# Patient Record
Sex: Female | Born: 1945 | Race: Black or African American | Hispanic: No | Marital: Married | Smoking: Former smoker
Health system: Southern US, Community
[De-identification: ages and names within clinical notes are randomized; demographics above are authoritative.]

## PROBLEM LIST (undated history)

## (undated) DIAGNOSIS — E785 Hyperlipidemia, unspecified: Secondary | ICD-10-CM

## (undated) DIAGNOSIS — G893 Neoplasm related pain (acute) (chronic): Secondary | ICD-10-CM

## (undated) DIAGNOSIS — M5416 Radiculopathy, lumbar region: Secondary | ICD-10-CM

## (undated) DIAGNOSIS — I10 Essential (primary) hypertension: Secondary | ICD-10-CM

## (undated) HISTORY — PX: ABDOMINAL HYSTERECTOMY: SHX81

---

## 2003-12-08 ENCOUNTER — Other Ambulatory Visit: Admission: RE | Admit: 2003-12-08 | Discharge: 2003-12-08 | Payer: Self-pay | Admitting: Obstetrics & Gynecology

## 2003-12-08 ENCOUNTER — Encounter: Admission: RE | Admit: 2003-12-08 | Discharge: 2003-12-08 | Payer: Self-pay | Admitting: Gastroenterology

## 2003-12-09 ENCOUNTER — Emergency Department (HOSPITAL_COMMUNITY): Admission: EM | Admit: 2003-12-09 | Discharge: 2003-12-09 | Payer: Self-pay | Admitting: Emergency Medicine

## 2003-12-29 ENCOUNTER — Ambulatory Visit (HOSPITAL_COMMUNITY): Admission: RE | Admit: 2003-12-29 | Discharge: 2003-12-29 | Payer: Self-pay | Admitting: Urology

## 2004-01-20 ENCOUNTER — Observation Stay (HOSPITAL_COMMUNITY): Admission: RE | Admit: 2004-01-20 | Discharge: 2004-01-21 | Payer: Self-pay | Admitting: Obstetrics & Gynecology

## 2004-02-12 ENCOUNTER — Ambulatory Visit (HOSPITAL_COMMUNITY): Admission: RE | Admit: 2004-02-12 | Discharge: 2004-02-12 | Payer: Self-pay | Admitting: Gynecology

## 2004-02-18 ENCOUNTER — Ambulatory Visit: Admission: RE | Admit: 2004-02-18 | Discharge: 2004-02-18 | Payer: Self-pay | Admitting: Gynecology

## 2004-04-08 ENCOUNTER — Emergency Department (HOSPITAL_COMMUNITY): Admission: EM | Admit: 2004-04-08 | Discharge: 2004-04-08 | Payer: Self-pay | Admitting: Emergency Medicine

## 2004-06-24 ENCOUNTER — Ambulatory Visit: Admission: RE | Admit: 2004-06-24 | Discharge: 2004-06-24 | Payer: Self-pay | Admitting: Urology

## 2004-07-15 ENCOUNTER — Ambulatory Visit (HOSPITAL_COMMUNITY): Admission: RE | Admit: 2004-07-15 | Discharge: 2004-07-15 | Payer: Self-pay | Admitting: Urology

## 2004-07-19 ENCOUNTER — Ambulatory Visit (HOSPITAL_COMMUNITY): Admission: RE | Admit: 2004-07-19 | Discharge: 2004-07-19 | Payer: Self-pay | Admitting: Oncology

## 2005-08-01 ENCOUNTER — Ambulatory Visit (HOSPITAL_COMMUNITY): Admission: RE | Admit: 2005-08-01 | Discharge: 2005-08-01 | Payer: Self-pay | Admitting: Urology

## 2010-02-20 ENCOUNTER — Encounter: Payer: Self-pay | Admitting: Gastroenterology

## 2010-02-20 ENCOUNTER — Encounter: Payer: Self-pay | Admitting: Urology

## 2010-02-21 ENCOUNTER — Encounter: Payer: Self-pay | Admitting: Urology

## 2013-02-25 ENCOUNTER — Other Ambulatory Visit: Payer: Self-pay | Admitting: Family Medicine

## 2013-02-25 DIAGNOSIS — Z1231 Encounter for screening mammogram for malignant neoplasm of breast: Secondary | ICD-10-CM

## 2013-07-22 ENCOUNTER — Encounter (INDEPENDENT_AMBULATORY_CARE_PROVIDER_SITE_OTHER): Payer: Self-pay

## 2013-07-22 ENCOUNTER — Ambulatory Visit
Admission: RE | Admit: 2013-07-22 | Discharge: 2013-07-22 | Disposition: A | Discharge status: Home / Self Care | Payer: PRIVATE HEALTH INSURANCE | Source: Ambulatory Visit | Attending: Family Medicine | Admitting: Family Medicine

## 2013-07-22 DIAGNOSIS — Z1231 Encounter for screening mammogram for malignant neoplasm of breast: Secondary | ICD-10-CM

## 2013-07-23 ENCOUNTER — Other Ambulatory Visit: Payer: Self-pay | Admitting: Family Medicine

## 2013-07-23 DIAGNOSIS — R928 Other abnormal and inconclusive findings on diagnostic imaging of breast: Secondary | ICD-10-CM

## 2013-07-29 ENCOUNTER — Ambulatory Visit
Admission: RE | Admit: 2013-07-29 | Discharge: 2013-07-29 | Disposition: A | Discharge status: Home / Self Care | Payer: PRIVATE HEALTH INSURANCE | Source: Ambulatory Visit | Attending: Family Medicine | Admitting: Family Medicine

## 2013-07-29 ENCOUNTER — Other Ambulatory Visit: Payer: PRIVATE HEALTH INSURANCE

## 2013-07-29 DIAGNOSIS — R928 Other abnormal and inconclusive findings on diagnostic imaging of breast: Secondary | ICD-10-CM

## 2014-10-01 ENCOUNTER — Other Ambulatory Visit: Payer: Self-pay | Admitting: Family Medicine

## 2014-10-01 DIAGNOSIS — E2839 Other primary ovarian failure: Secondary | ICD-10-CM

## 2014-10-14 ENCOUNTER — Other Ambulatory Visit: Payer: PRIVATE HEALTH INSURANCE

## 2014-11-03 ENCOUNTER — Ambulatory Visit
Admission: RE | Admit: 2014-11-03 | Discharge: 2014-11-03 | Disposition: A | Discharge status: Home / Self Care | Payer: PRIVATE HEALTH INSURANCE | Source: Ambulatory Visit | Attending: Family Medicine | Admitting: Family Medicine

## 2014-11-03 DIAGNOSIS — E2839 Other primary ovarian failure: Secondary | ICD-10-CM

## 2016-04-04 ENCOUNTER — Other Ambulatory Visit: Payer: Self-pay | Admitting: Family Medicine

## 2016-04-04 DIAGNOSIS — Z1231 Encounter for screening mammogram for malignant neoplasm of breast: Secondary | ICD-10-CM

## 2016-06-14 ENCOUNTER — Ambulatory Visit
Admission: RE | Admit: 2016-06-14 | Discharge: 2016-06-14 | Disposition: A | Discharge status: Home / Self Care | Payer: PRIVATE HEALTH INSURANCE | Source: Ambulatory Visit | Attending: Family Medicine | Admitting: Family Medicine

## 2016-06-14 DIAGNOSIS — Z1231 Encounter for screening mammogram for malignant neoplasm of breast: Secondary | ICD-10-CM

## 2017-07-20 ENCOUNTER — Other Ambulatory Visit: Payer: Self-pay | Admitting: Family Medicine

## 2017-07-20 DIAGNOSIS — Z1231 Encounter for screening mammogram for malignant neoplasm of breast: Secondary | ICD-10-CM

## 2017-08-28 ENCOUNTER — Encounter: Payer: Self-pay | Admitting: Radiology

## 2017-08-28 ENCOUNTER — Ambulatory Visit
Admission: RE | Admit: 2017-08-28 | Discharge: 2017-08-28 | Disposition: A | Discharge status: Home / Self Care | Payer: PRIVATE HEALTH INSURANCE | Source: Ambulatory Visit | Attending: Family Medicine | Admitting: Family Medicine

## 2017-08-28 DIAGNOSIS — Z1231 Encounter for screening mammogram for malignant neoplasm of breast: Secondary | ICD-10-CM

## 2019-03-23 ENCOUNTER — Ambulatory Visit: Payer: PRIVATE HEALTH INSURANCE | Attending: Internal Medicine

## 2019-03-23 DIAGNOSIS — Z23 Encounter for immunization: Secondary | ICD-10-CM | POA: Insufficient documentation

## 2019-03-23 NOTE — Progress Notes (Signed)
   Covid-19 Vaccination Clinic  Name:  OTILIA KAREEM    MRN: [id number] DOB: 08/31/1949  03/23/2019  Ms. Heyliger was observed post Covid-19 immunization for 15 minutes without incidence. She was provided with Vaccine Information Sheet and instruction to access the V-Safe system.   Ms. Perdomo was instructed to call 911 with any severe reactions post vaccine: Marland Kitchen Difficulty breathing  . Swelling of your face and throat  . A fast heartbeat  . A bad rash all over your body  . Dizziness and weakness    Immunizations Administered    Name Date Dose VIS Date Route   Pfizer COVID-19 Vaccine 03/23/2019  3:13 PM 0.3 mL 01/11/2019 Intramuscular   Manufacturer: [place]   Lot: [id number]   [place]: [address]-6

## 2019-04-16 ENCOUNTER — Ambulatory Visit: Payer: PRIVATE HEALTH INSURANCE | Attending: Internal Medicine

## 2019-04-16 DIAGNOSIS — Z23 Encounter for immunization: Secondary | ICD-10-CM

## 2019-04-16 NOTE — Progress Notes (Signed)
   Covid-19 Vaccination Clinic  Name:  Alisha Goodman    MRN: [id number] DOB: 08/31/1949  04/16/2019  [name] was observed post Covid-19 immunization for 15 minutes without incident. She was provided with Vaccine Information Sheet and instruction to access the V-Safe system.   [name] was instructed to call 911 with any severe reactions post vaccine: Marland Kitchen Difficulty breathing  . Swelling of face and throat  . A fast heartbeat  . A bad rash all over body  . Dizziness and weakness   Immunizations Administered    Name Date Dose VIS Date Route   Pfizer COVID-19 Vaccine 04/16/2019  1:21 PM 0.3 mL 01/11/2019 Intramuscular   Manufacturer: [place]   Lot: [id number]   [place]: [id number]

## 2019-06-27 ENCOUNTER — Other Ambulatory Visit: Payer: Self-pay

## 2019-06-27 ENCOUNTER — Encounter (HOSPITAL_COMMUNITY): Payer: Self-pay | Admitting: Emergency Medicine

## 2019-06-27 DIAGNOSIS — Z90710 Acquired absence of both cervix and uterus: Secondary | ICD-10-CM

## 2019-06-27 DIAGNOSIS — I472 Ventricular tachycardia: Secondary | ICD-10-CM | POA: Diagnosis not present

## 2019-06-27 DIAGNOSIS — Z886 Allergy status to analgesic agent status: Secondary | ICD-10-CM

## 2019-06-27 DIAGNOSIS — G893 Neoplasm related pain (acute) (chronic): Secondary | ICD-10-CM | POA: Diagnosis present

## 2019-06-27 DIAGNOSIS — Z20822 Contact with and (suspected) exposure to covid-19: Secondary | ICD-10-CM | POA: Diagnosis present

## 2019-06-27 DIAGNOSIS — D638 Anemia in other chronic diseases classified elsewhere: Secondary | ICD-10-CM | POA: Diagnosis present

## 2019-06-27 DIAGNOSIS — R64 Cachexia: Secondary | ICD-10-CM | POA: Diagnosis present

## 2019-06-27 DIAGNOSIS — J439 Emphysema, unspecified: Secondary | ICD-10-CM | POA: Diagnosis present

## 2019-06-27 DIAGNOSIS — Z79899 Other long term (current) drug therapy: Secondary | ICD-10-CM

## 2019-06-27 DIAGNOSIS — I5021 Acute systolic (congestive) heart failure: Secondary | ICD-10-CM | POA: Diagnosis present

## 2019-06-27 DIAGNOSIS — Z882 Allergy status to sulfonamides status: Secondary | ICD-10-CM

## 2019-06-27 DIAGNOSIS — Z6821 Body mass index (BMI) 21.0-21.9, adult: Secondary | ICD-10-CM

## 2019-06-27 DIAGNOSIS — C259 Malignant neoplasm of pancreas, unspecified: Secondary | ICD-10-CM | POA: Diagnosis present

## 2019-06-27 DIAGNOSIS — T40605A Adverse effect of unspecified narcotics, initial encounter: Secondary | ICD-10-CM | POA: Diagnosis present

## 2019-06-27 DIAGNOSIS — I700 Atherosclerosis of aorta: Secondary | ICD-10-CM | POA: Diagnosis present

## 2019-06-27 DIAGNOSIS — I110 Hypertensive heart disease with heart failure: Secondary | ICD-10-CM | POA: Diagnosis present

## 2019-06-27 DIAGNOSIS — C7989 Secondary malignant neoplasm of other specified sites: Secondary | ICD-10-CM | POA: Diagnosis present

## 2019-06-27 DIAGNOSIS — C3491 Malignant neoplasm of unspecified part of right bronchus or lung: Principal | ICD-10-CM | POA: Diagnosis present

## 2019-06-27 DIAGNOSIS — C771 Secondary and unspecified malignant neoplasm of intrathoracic lymph nodes: Secondary | ICD-10-CM | POA: Diagnosis present

## 2019-06-27 DIAGNOSIS — B9620 Unspecified Escherichia coli [E. coli] as the cause of diseases classified elsewhere: Secondary | ICD-10-CM | POA: Diagnosis present

## 2019-06-27 DIAGNOSIS — R590 Localized enlarged lymph nodes: Secondary | ICD-10-CM | POA: Diagnosis present

## 2019-06-27 DIAGNOSIS — J9809 Other diseases of bronchus, not elsewhere classified: Secondary | ICD-10-CM | POA: Diagnosis present

## 2019-06-27 DIAGNOSIS — C7951 Secondary malignant neoplasm of bone: Secondary | ICD-10-CM | POA: Diagnosis present

## 2019-06-27 DIAGNOSIS — I4892 Unspecified atrial flutter: Secondary | ICD-10-CM | POA: Diagnosis not present

## 2019-06-27 DIAGNOSIS — E785 Hyperlipidemia, unspecified: Secondary | ICD-10-CM | POA: Diagnosis present

## 2019-06-27 DIAGNOSIS — M5416 Radiculopathy, lumbar region: Secondary | ICD-10-CM | POA: Diagnosis present

## 2019-06-27 DIAGNOSIS — K5903 Drug induced constipation: Secondary | ICD-10-CM | POA: Diagnosis present

## 2019-06-27 DIAGNOSIS — I447 Left bundle-branch block, unspecified: Secondary | ICD-10-CM | POA: Diagnosis present

## 2019-06-27 DIAGNOSIS — C787 Secondary malignant neoplasm of liver and intrahepatic bile duct: Secondary | ICD-10-CM | POA: Diagnosis present

## 2019-06-27 DIAGNOSIS — E871 Hypo-osmolality and hyponatremia: Secondary | ICD-10-CM | POA: Diagnosis present

## 2019-06-27 DIAGNOSIS — N390 Urinary tract infection, site not specified: Secondary | ICD-10-CM | POA: Diagnosis present

## 2019-06-27 DIAGNOSIS — M62838 Other muscle spasm: Secondary | ICD-10-CM | POA: Diagnosis present

## 2019-06-27 NOTE — ED Triage Notes (Signed)
Per pt, states left lower back pain that radiates down the left leg-stopped PT 2 weeks ago because it wasn't doing any good, also complaining of nausea

## 2019-06-28 ENCOUNTER — Inpatient Hospital Stay (HOSPITAL_COMMUNITY)
Admission: EM | Admit: 2019-06-28 | Discharge: 2019-07-02 | DRG: 987 | Disposition: A | Discharge status: Home Health | Payer: Medicare (Managed Care) | Attending: Internal Medicine | Admitting: Internal Medicine

## 2019-06-28 ENCOUNTER — Inpatient Hospital Stay (HOSPITAL_COMMUNITY): Payer: Medicare (Managed Care)

## 2019-06-28 ENCOUNTER — Emergency Department (HOSPITAL_COMMUNITY): Payer: Medicare (Managed Care)

## 2019-06-28 ENCOUNTER — Other Ambulatory Visit: Payer: Self-pay

## 2019-06-28 ENCOUNTER — Encounter (HOSPITAL_COMMUNITY): Payer: Self-pay | Admitting: Internal Medicine

## 2019-06-28 DIAGNOSIS — I110 Hypertensive heart disease with heart failure: Secondary | ICD-10-CM | POA: Diagnosis present

## 2019-06-28 DIAGNOSIS — K5900 Constipation, unspecified: Secondary | ICD-10-CM | POA: Diagnosis not present

## 2019-06-28 DIAGNOSIS — M5416 Radiculopathy, lumbar region: Secondary | ICD-10-CM | POA: Diagnosis present

## 2019-06-28 DIAGNOSIS — I700 Atherosclerosis of aorta: Secondary | ICD-10-CM | POA: Diagnosis present

## 2019-06-28 DIAGNOSIS — C800 Disseminated malignant neoplasm, unspecified: Secondary | ICD-10-CM | POA: Diagnosis not present

## 2019-06-28 DIAGNOSIS — R64 Cachexia: Secondary | ICD-10-CM | POA: Diagnosis present

## 2019-06-28 DIAGNOSIS — C770 Secondary and unspecified malignant neoplasm of lymph nodes of head, face and neck: Secondary | ICD-10-CM | POA: Diagnosis not present

## 2019-06-28 DIAGNOSIS — D638 Anemia in other chronic diseases classified elsewhere: Secondary | ICD-10-CM | POA: Diagnosis present

## 2019-06-28 DIAGNOSIS — C771 Secondary and unspecified malignant neoplasm of intrathoracic lymph nodes: Secondary | ICD-10-CM | POA: Diagnosis present

## 2019-06-28 DIAGNOSIS — C799 Secondary malignant neoplasm of unspecified site: Secondary | ICD-10-CM

## 2019-06-28 DIAGNOSIS — Z20822 Contact with and (suspected) exposure to covid-19: Secondary | ICD-10-CM | POA: Diagnosis present

## 2019-06-28 DIAGNOSIS — R079 Chest pain, unspecified: Secondary | ICD-10-CM

## 2019-06-28 DIAGNOSIS — R7989 Other specified abnormal findings of blood chemistry: Secondary | ICD-10-CM | POA: Diagnosis not present

## 2019-06-28 DIAGNOSIS — N390 Urinary tract infection, site not specified: Secondary | ICD-10-CM

## 2019-06-28 DIAGNOSIS — E871 Hypo-osmolality and hyponatremia: Secondary | ICD-10-CM

## 2019-06-28 DIAGNOSIS — I5022 Chronic systolic (congestive) heart failure: Secondary | ICD-10-CM

## 2019-06-28 DIAGNOSIS — C7951 Secondary malignant neoplasm of bone: Secondary | ICD-10-CM | POA: Diagnosis present

## 2019-06-28 DIAGNOSIS — J439 Emphysema, unspecified: Secondary | ICD-10-CM

## 2019-06-28 DIAGNOSIS — M62838 Other muscle spasm: Secondary | ICD-10-CM | POA: Diagnosis present

## 2019-06-28 DIAGNOSIS — R590 Localized enlarged lymph nodes: Secondary | ICD-10-CM | POA: Diagnosis present

## 2019-06-28 DIAGNOSIS — C7989 Secondary malignant neoplasm of other specified sites: Secondary | ICD-10-CM | POA: Diagnosis present

## 2019-06-28 DIAGNOSIS — C782 Secondary malignant neoplasm of pleura: Secondary | ICD-10-CM

## 2019-06-28 DIAGNOSIS — R000 Tachycardia, unspecified: Secondary | ICD-10-CM | POA: Diagnosis not present

## 2019-06-28 DIAGNOSIS — C259 Malignant neoplasm of pancreas, unspecified: Secondary | ICD-10-CM | POA: Diagnosis present

## 2019-06-28 DIAGNOSIS — I4892 Unspecified atrial flutter: Secondary | ICD-10-CM | POA: Diagnosis not present

## 2019-06-28 DIAGNOSIS — I5021 Acute systolic (congestive) heart failure: Secondary | ICD-10-CM | POA: Diagnosis not present

## 2019-06-28 DIAGNOSIS — K5903 Drug induced constipation: Secondary | ICD-10-CM | POA: Diagnosis present

## 2019-06-28 DIAGNOSIS — B9620 Unspecified Escherichia coli [E. coli] as the cause of diseases classified elsewhere: Secondary | ICD-10-CM | POA: Diagnosis present

## 2019-06-28 DIAGNOSIS — G893 Neoplasm related pain (acute) (chronic): Secondary | ICD-10-CM | POA: Diagnosis present

## 2019-06-28 DIAGNOSIS — I472 Ventricular tachycardia: Secondary | ICD-10-CM | POA: Diagnosis not present

## 2019-06-28 DIAGNOSIS — C787 Secondary malignant neoplasm of liver and intrahepatic bile duct: Secondary | ICD-10-CM | POA: Diagnosis present

## 2019-06-28 DIAGNOSIS — I10 Essential (primary) hypertension: Secondary | ICD-10-CM | POA: Diagnosis not present

## 2019-06-28 DIAGNOSIS — C3491 Malignant neoplasm of unspecified part of right bronchus or lung: Secondary | ICD-10-CM | POA: Diagnosis present

## 2019-06-28 HISTORY — DX: Hyperlipidemia, unspecified: E78.5

## 2019-06-28 HISTORY — DX: Essential (primary) hypertension: I10

## 2019-06-28 HISTORY — DX: Secondary malignant neoplasm of unspecified site: C79.9

## 2019-06-28 HISTORY — DX: Neoplasm related pain (acute) (chronic): G89.3

## 2019-06-28 HISTORY — DX: Radiculopathy, lumbar region: M54.16

## 2019-06-28 LAB — URINALYSIS, ROUTINE W REFLEX MICROSCOPIC
Bilirubin Urine: NEGATIVE
Glucose, UA: NEGATIVE mg/dL
Ketones, ur: NEGATIVE mg/dL
Nitrite: NEGATIVE
Protein, ur: 100 mg/dL — AB
Specific Gravity, Urine: 1.024 (ref 1.005–1.030)
WBC, UA: >50 WBC/hpf — ABNORMAL HIGH (ref 0–5)
pH: 7 (ref 5.0–8.0)

## 2019-06-28 LAB — CBC WITH DIFFERENTIAL/PLATELET
Abs Immature Granulocytes: 0.12 10*3/uL — ABNORMAL HIGH (ref 0.00–0.07)
Basophils Absolute: 0 10*3/uL (ref 0.0–0.1)
Basophils Relative: 0 %
Eosinophils Absolute: 0 10*3/uL (ref 0.0–0.5)
Eosinophils Relative: 0 %
HCT: 36.7 % (ref 36.0–46.0)
Hemoglobin: 11.9 g/dL — ABNORMAL LOW (ref 12.0–15.0)
Immature Granulocytes: 1 %
Lymphocytes Relative: 13 %
Lymphs Abs: 1.7 10*3/uL (ref 0.7–4.0)
MCH: 27 pg (ref 26.0–34.0)
MCHC: 32.4 g/dL (ref 30.0–36.0)
MCV: 83.2 fL (ref 80.0–100.0)
Monocytes Absolute: 1 10*3/uL (ref 0.1–1.0)
Monocytes Relative: 8 %
Neutro Abs: 9.5 10*3/uL — ABNORMAL HIGH (ref 1.7–7.7)
Neutrophils Relative %: 78 %
Platelets: 617 10*3/uL — ABNORMAL HIGH (ref 150–400)
RBC: 4.41 MIL/uL (ref 3.87–5.11)
RDW: 14.6 % (ref 11.5–15.5)
WBC: 12.3 10*3/uL — ABNORMAL HIGH (ref 4.0–10.5)
nRBC: 0 % (ref 0.0–0.2)

## 2019-06-28 LAB — CBC
HCT: 35 % — ABNORMAL LOW (ref 36.0–46.0)
Hemoglobin: 11.6 g/dL — ABNORMAL LOW (ref 12.0–15.0)
MCH: 27.6 pg (ref 26.0–34.0)
MCHC: 33.1 g/dL (ref 30.0–36.0)
MCV: 83.1 fL (ref 80.0–100.0)
Platelets: 540 10*3/uL — ABNORMAL HIGH (ref 150–400)
RBC: 4.21 MIL/uL (ref 3.87–5.11)
RDW: 14.6 % (ref 11.5–15.5)
WBC: 10.8 10*3/uL — ABNORMAL HIGH (ref 4.0–10.5)
nRBC: 0 % (ref 0.0–0.2)

## 2019-06-28 LAB — ECHOCARDIOGRAM COMPLETE
Height: 65 in
Weight: 2064 oz

## 2019-06-28 LAB — COMPREHENSIVE METABOLIC PANEL
ALT: 375 U/L — ABNORMAL HIGH (ref 0–44)
AST: 264 U/L — ABNORMAL HIGH (ref 15–41)
Albumin: 4 g/dL (ref 3.5–5.0)
Alkaline Phosphatase: 351 U/L — ABNORMAL HIGH (ref 38–126)
Anion gap: 15 (ref 5–15)
BUN: 20 mg/dL (ref 8–23)
CO2: 21 mmol/L — ABNORMAL LOW (ref 22–32)
Calcium: 9.5 mg/dL (ref 8.9–10.3)
Chloride: 96 mmol/L — ABNORMAL LOW (ref 98–111)
Creatinine, Ser: 1.11 mg/dL — ABNORMAL HIGH (ref 0.44–1.00)
GFR calc Af Amer: 58 mL/min — ABNORMAL LOW (ref >60)
GFR calc non Af Amer: 50 mL/min — ABNORMAL LOW (ref >60)
Glucose, Bld: 105 mg/dL — ABNORMAL HIGH (ref 70–99)
Potassium: 3.7 mmol/L (ref 3.5–5.1)
Sodium: 132 mmol/L — ABNORMAL LOW (ref 135–145)
Total Bilirubin: 1 mg/dL (ref 0.3–1.2)
Total Protein: 8.9 g/dL — ABNORMAL HIGH (ref 6.5–8.1)

## 2019-06-28 LAB — BRAIN NATRIURETIC PEPTIDE: B Natriuretic Peptide: 30.2 pg/mL (ref 0.0–100.0)

## 2019-06-28 LAB — CREATININE, SERUM
Creatinine, Ser: 0.92 mg/dL (ref 0.44–1.00)
GFR calc Af Amer: >60 mL/min (ref >60)
GFR calc non Af Amer: >60 mL/min (ref >60)

## 2019-06-28 LAB — HIV ANTIBODY (ROUTINE TESTING W REFLEX): HIV Screen 4th Generation wRfx: NONREACTIVE

## 2019-06-28 LAB — SARS CORONAVIRUS 2 BY RT PCR (HOSPITAL ORDER, PERFORMED IN ~~LOC~~ HOSPITAL LAB): SARS Coronavirus 2: NEGATIVE

## 2019-06-28 LAB — LIPASE, BLOOD: Lipase: 325 U/L — ABNORMAL HIGH (ref 11–51)

## 2019-06-28 MED ORDER — GABAPENTIN 100 MG PO CAPS
100.0000 mg | ORAL_CAPSULE | Freq: Every day | ORAL | Status: DC
  Administered 2019-06-29 – 2019-07-01 (×3): 100 mg via ORAL
  Filled 2019-06-28 (×4): qty 1

## 2019-06-28 MED ORDER — IOHEXOL 300 MG/ML  SOLN
100.0000 mL | Freq: Once | INTRAMUSCULAR | Status: AC | PRN
  Administered 2019-06-28: 100 mL via INTRAVENOUS

## 2019-06-28 MED ORDER — ACETAMINOPHEN 650 MG RE SUPP: 650.0000 mg | Freq: Four times a day (QID) | RECTAL | Status: DC | PRN

## 2019-06-28 MED ORDER — NICOTINE 21 MG/24HR TD PT24
21.0000 mg | MEDICATED_PATCH | Freq: Every day | TRANSDERMAL | Status: DC
  Administered 2019-06-28 – 2019-07-02 (×5): 21 mg via TRANSDERMAL
  Filled 2019-06-28 (×5): qty 1

## 2019-06-28 MED ORDER — ONDANSETRON HCL 4 MG/2ML IJ SOLN: 4.0000 mg | Freq: Four times a day (QID) | INTRAMUSCULAR | Status: DC | PRN

## 2019-06-28 MED ORDER — HEPARIN SODIUM (PORCINE) 5000 UNIT/ML IJ SOLN
5000.0000 [IU] | Freq: Three times a day (TID) | INTRAMUSCULAR | Status: DC
  Administered 2019-06-28 – 2019-07-02 (×13): 5000 [IU] via SUBCUTANEOUS
  Filled 2019-06-28 (×13): qty 1

## 2019-06-28 MED ORDER — LIDOCAINE HCL 1 % IJ SOLN
INTRAMUSCULAR | Status: AC
  Filled 2019-06-28: qty 20

## 2019-06-28 MED ORDER — SODIUM CHLORIDE 0.9% FLUSH
3.0000 mL | Freq: Two times a day (BID) | INTRAVENOUS | Status: DC
  Administered 2019-06-28 – 2019-07-02 (×8): 3 mL via INTRAVENOUS

## 2019-06-28 MED ORDER — OXYCODONE HCL 5 MG PO TABS: 5.0000 mg | ORAL_TABLET | ORAL | Status: DC | PRN

## 2019-06-28 MED ORDER — PERFLUTREN LIPID MICROSPHERE
1.0000 mL | INTRAVENOUS | Status: AC | PRN
  Administered 2019-06-28: 2 mL via INTRAVENOUS
  Filled 2019-06-28: qty 10

## 2019-06-28 MED ORDER — CLONIDINE HCL 0.2 MG PO TABS
0.3000 mg | ORAL_TABLET | Freq: Two times a day (BID) | ORAL | Status: DC
  Administered 2019-06-28 – 2019-06-30 (×6): 0.3 mg via ORAL
  Filled 2019-06-28: qty 1
  Filled 2019-06-28: qty 3
  Filled 2019-06-28 (×4): qty 1

## 2019-06-28 MED ORDER — ONDANSETRON HCL 4 MG PO TABS: 4.0000 mg | ORAL_TABLET | Freq: Four times a day (QID) | ORAL | Status: DC | PRN

## 2019-06-28 MED ORDER — FENTANYL CITRATE (PF) 100 MCG/2ML IJ SOLN
25.0000 ug | Freq: Once | INTRAMUSCULAR | Status: AC
  Administered 2019-06-28: 25 ug via INTRAVENOUS
  Filled 2019-06-28: qty 2

## 2019-06-28 MED ORDER — OXYCODONE HCL 5 MG PO TABS
5.0000 mg | ORAL_TABLET | ORAL | Status: DC | PRN
  Administered 2019-06-28 – 2019-06-29 (×4): 5 mg via ORAL
  Filled 2019-06-28 (×4): qty 1

## 2019-06-28 MED ORDER — ACETAMINOPHEN 325 MG PO TABS
650.0000 mg | ORAL_TABLET | Freq: Four times a day (QID) | ORAL | Status: DC | PRN
  Administered 2019-06-29 (×2): 650 mg via ORAL
  Filled 2019-06-28 (×2): qty 2

## 2019-06-28 MED ORDER — AMLODIPINE BESYLATE 10 MG PO TABS
10.0000 mg | ORAL_TABLET | Freq: Every day | ORAL | Status: DC
  Administered 2019-06-28 – 2019-06-29 (×2): 10 mg via ORAL
  Filled 2019-06-28: qty 2
  Filled 2019-06-28 (×2): qty 1

## 2019-06-28 MED ORDER — SODIUM CHLORIDE 0.9 % IV SOLN: INTRAVENOUS | Status: AC

## 2019-06-28 MED ORDER — HYDROMORPHONE HCL 1 MG/ML IJ SOLN: 0.5000 mg | INTRAMUSCULAR | Status: DC | PRN

## 2019-06-28 MED ORDER — ALBUTEROL SULFATE (2.5 MG/3ML) 0.083% IN NEBU: 2.5000 mg | INHALATION_SOLUTION | RESPIRATORY_TRACT | Status: DC | PRN

## 2019-06-28 MED ORDER — HYDROMORPHONE HCL 1 MG/ML IJ SOLN
0.5000 mg | INTRAMUSCULAR | Status: DC | PRN
  Administered 2019-06-28 (×3): 1 mg via INTRAVENOUS
  Filled 2019-06-28 (×3): qty 1

## 2019-06-28 MED ORDER — SODIUM CHLORIDE (PF) 0.9 % IJ SOLN
INTRAMUSCULAR | Status: AC
  Filled 2019-06-28: qty 50

## 2019-06-28 MED ORDER — SODIUM CHLORIDE 0.9 % IV SOLN
1.0000 g | INTRAVENOUS | Status: DC
  Administered 2019-06-28: 1 g via INTRAVENOUS
  Filled 2019-06-28: qty 10

## 2019-06-28 NOTE — Progress Notes (Signed)
Patient ID: Alisha Goodman, female   DOB: 08/31/1949, 74 y.o.   MRN: [id number] Bx request received on pt. Case/imaging studies reviewed by Dr. Earleen Newport and d/w Dr. Burr Medico. Decision made to proceed with US guided left supraclavicular LN bx this afternoon. Risks and benefits of procedure was discussed with the patient including, but not limited to bleeding, infection, damage to adjacent structures or low yield requiring additional tests.  All of the questions were answered and there is agreement to proceed.  Consent signed and in chart.

## 2019-06-28 NOTE — Progress Notes (Signed)
PROGRESS NOTE  Alisha Goodman YTK:[id number] DOB: 08/31/1949 DOA: 06/28/2019 PCP: Jolinda Croak, MD  Brief History   74 year old woman PMH lumbar radiculopathy, smoker, presented to the emergency department with progressive left leg pain for several weeks.  Weight loss reported.  A & P  Left leg pain impairing ability to walk --Presumably secondary to metastatic disease.  Motor function intact, sensation intact. --Trial oxycodone.  PT evaluation.  Multifocal lesions throughout the chest and abdomen suspicious for metastatic disease, primary unclear but possibly right lung primary versus pancreatic mass.  Right renal mass.  Subcutaneous metastasis right chest wall.  Bulky mediastinal and right hilar adenopathy with narrowing of the right upper lobe bronchus, right bronchus intermedius, upper and lower lobe bronchi. --Main symptom is pain.  No respiratory symptoms.  --Patient interested in pursuing biopsy, if not via bronchoscopy, will discuss with interventional radiology.  Pulmonology consulted. --will need close outpatient oncology followup, will arrange --We will confer with GI, may need outpatient follow-up to monitor for biliary obstruction.  Elevated LFTs --Asymptomatic.  Secondary to metastatic disease  Aortic atherosclerosis  Emphysema  Disposition Plan:  Discussion: Diagnosis: Metastatic cancer.  Patient requested I not speak with her son in regard to her condition.  She understands current diagnoses and wishes to proceed with biopsy.  We will proceed with pain control, therapy evaluation.  Will confer with pulmonology, oncology, GI for close coordination of care in the near future.  Status is: Inpatient  Remains inpatient appropriate because:Ongoing diagnostic testing needed not appropriate for outpatient work up   Dispo: The patient is from: Home              Anticipated d/c is to: Home              Anticipated d/c date is: 2 days              Patient currently is  not medically stable to d/c.  DVT prophylaxis: SCDs Code Status: Full Family Communication: Patient specifically requested I not talk to her son    [name], MD  [facility] Direct contact: see [email or web address] (further directions at bottom of note if needed) 7PM-7AM contact night coverage as at bottom of note 06/28/2019, 12:02 PM  LOS: 0 days   Significant Hospital Events   .    Consults:  .    Procedures:  .   Significant Diagnostic Tests:  Marland Kitchen    Micro Data:  .    Antimicrobials:     Interval History/Subjective  Complaints.  Only complaint is left leg, thigh pain. "I cannot believe it".  Objective   Vitals:  Vitals:   06/28/19 1100 06/28/19 1114  BP: 120/72 120/72  Pulse: (!) 115 (!) 117  Resp: (!) 25 (!) 21  Temp:    SpO2: 100% 100%    Exam:  Constitutional.  Appears calm, comfortable. ENT.  Voice is hoarse. Respiratory.  Clear to auscultation bilaterally.  No wheezes, rales or rhonchi.  Normal respiratory effort. Cardiovascular.  Regular rate and rhythm.  No murmur, rub or gallop.  No lower extremity edema. Musculoskeletal.  Left lower extremity strength appears grossly intact.  Thigh appears unremarkable.  No masses appreciated. Skin.  No nodules noted. Neurologic.  Sensation left lower extremity grossly intact. Psychiatric.  Grossly normal mood and affect.  Speech fluent and appropriate.  I have personally reviewed the following:   Today's Data  . Modest hyponatremia with sodium 132, lipase 325, AST 264, ALT 375, total bilirubin 1.0 .  BMP within normal limits . CBC unremarkable  Scheduled Meds: . amLODipine  10 mg Oral Daily  . cloNIDine  0.3 mg Oral BID  . gabapentin  100 mg Oral Daily  . heparin  5,000 Units Subcutaneous Q8H  . nicotine  21 mg Transdermal Daily  . sodium chloride (PF)      . sodium chloride flush  3 mL Intravenous Q12H   Continuous Infusions: . sodium chloride 75 mL/hr at 06/28/19 0604  . cefTRIAXone (ROCEPHIN)   IV 1 g (06/28/19 0370)    Active Problems:   Metastatic cancer ([place])   LOS: 0 days   How to contact the [facility] Attending or Consulting provider [place] or covering provider during after hours [place], for this patient?  1. Check the care team in [facility] and look for a) attending/consulting [facility] provider listed and b) the [facility] team listed 2. Log into [email or web address] and use [place]'s universal password to access. If you do not have the password, please contact the hospital operator. 3. Locate the [facility] provider you are looking for under [facility] and page to a number that you can be directly reached. 4. If you still have difficulty reaching the provider, please page the [facility] (Director on Call) for the Hospitalists listed on amion for assistance.

## 2019-06-28 NOTE — Progress Notes (Signed)
  Echocardiogram 2D Echocardiogram has been performed.  Michiel Cowboy 06/28/2019, 2:29 PM

## 2019-06-28 NOTE — Progress Notes (Signed)
CTSP for red MEWS Patient sitting, eating dinner, appears well and unchanged from this morning. Temp now 99.1, respirations charted at 24 but appear less on my exam.  Blood pressure 150/79 and pulse 120. Tachycardic on exam but lungs are clear. Speech fluent and clear, no acute distress noted.  Assessment/plan. Continue antihypertensives Continue to monitor condition Treat pain  [name], MD [facility]

## 2019-06-28 NOTE — ED Notes (Signed)
Pt being taken for [place] Core Biopsy, will take pt to floor on return.

## 2019-06-28 NOTE — Plan of Care (Signed)
  Problem: Education: Goal: Knowledge of General Education information will improve Description: Including pain rating scale, medication(s)/side effects and non-pharmacologic comfort measures Outcome: Completed/Met

## 2019-06-28 NOTE — Procedures (Signed)
Interventional Radiology Procedure Note  Procedure: US guided biopsy of pathologic left supraclavicular lymph node.   Mx 18g core biopsy   Complications: None Recommendations:  - follow up pathology - Do not submerge for 7 days - Routine wound care   Signed,  Dulcy Fanny. Earleen Newport, DO

## 2019-06-28 NOTE — Consult Note (Signed)
NAME:  Alisha Goodman, MRN:  [id number], DOB:  08/31/1949, LOS: 0 ADMISSION DATE:  06/28/2019, CONSULTATION DATE:  06/28/2019 REFERRING MD:  Dr. Sarajane Jews, [facility], CHIEF COMPLAINT:  Abnormal CT chest   Brief History   74 yo female former smoker presented to ER on 5/27 with back pain.  Found to have widely metastatic cancer on CT chest/abd/pelvis with possible primary lesions from lung or pancreas.  History of present illness   She has hx of smoking.  Has lost about 10 to 15 lbs over past month.  Has noticed having hoarse voice over last couple of weeks.  Developed pain and muscle spasm in Lt leg.  Came to ER for further assessment.  Found to have lesion on CT chest/abd/pelvis concerning for metastatic cancer.  Denies fever, sweats, hemoptysis, chest pain, abdominal pain, skin rash, joint swelling.  Past Medical History  HTN  Significant Hospital Events   5/28 Admit  Consults:    Procedures:    Significant Diagnostic Tests:   CT chest 5/28 >> atherosclerosis, extensive mediastinal LAN with narrowing of Rt PA and Rt bronchus intermedius, multiple pulmonary nodules and possible lymphangitic spread, mild emphysema, Rt chest wall met  CT abd/pelvis 5/28 >> 6.1 x 4.3 cm liver lesion, mass in pancreatic body 4.3 x 2.5 cm, Rt adrenal mass 3.3 x 2.6 cm, multifocal adenopathy, Lt paraspinal met 3.1 x 2.2 cm  Echo 5/28 >>  Micro Data:  SARS CoV2 5/28 >> negative  Antimicrobials:    Interim history/subjective:    Objective   Blood pressure 133/81, pulse (!) 119, temperature 98.7 F (37.1 C), temperature source Oral, resp. rate (!) 26, height 5' 5"  (1.651 m), weight 58.5 kg, SpO2 99 %.       No intake or output data in the 24 hours ending 06/28/19 1352 Filed Weights   06/28/19 0032  Weight: 58.5 kg    Examination:  General - seen in ER Eyes - pupils reactive ENT - no sinus tenderness, no stridor, decreased voice caliber Cardiac - regular rate/rhythm, no murmur Chest - equal  breath sounds b/l, no wheezing or rales Abdomen - soft, non tender, + bowel sounds Extremities - no cyanosis, clubbing, or edema Skin - no rashes Neuro - normal strength, moves extremities, follows commands Psych - normal mood and behavior Lymphatics - no lymphadenopathy   Discussion:  She has hx of smoking.  She has lesions throughout her chest and abdomen.  Imaging studies are very concerning for metastatic cancer.  Primary lesion could most likely be from lung or pancreas, or perhaps two concurrent cancers.    Assessment & Plan:  - will need to coordinate best approach for biopsy - IR consulted by hospitalist team; this likely best initial approach if IR thinks there is an easily accessible lesion to biopsy - otherwise can arrange for bronchoscopy early part of next week; procedure explained to patient and she would be willing to proceed with bronchoscopy if necessary  Updated pt's son at bedside.  D/w Dr. Sarajane Jews.  Labs   CBC: Recent Labs  Lab 06/28/19 0028 06/28/19 0533  WBC 12.3* 10.8*  NEUTROABS 9.5*  --   HGB 11.9* 11.6*  HCT 36.7 35.0*  MCV 83.2 83.1  PLT 617* 540*    Basic Metabolic Panel: Recent Labs  Lab 06/28/19 0028 06/28/19 0533  NA 132*  --   K 3.7  --   CL 96*  --   CO2 21*  --   GLUCOSE 105*  --  BUN 20  --   CREATININE 1.11* 0.92  CALCIUM 9.5  --    GFR: Estimated Creatinine Clearance: 51.2 mL/min (by C-G formula based on SCr of 0.92 mg/dL). Recent Labs  Lab 06/28/19 0028 06/28/19 0533  WBC 12.3* 10.8*    Liver Function Tests: Recent Labs  Lab 06/28/19 0028  AST 264*  ALT 375*  ALKPHOS 351*  BILITOT 1.0  PROT 8.9*  ALBUMIN 4.0   Recent Labs  Lab 06/28/19 0028  LIPASE 325*     Review of Systems:   Reviewed and negative  Past Medical History  She,  has a past medical history of Hyperlipidemia, Hypertension, and Lumbar radiculopathy.   Surgical History    Past Surgical History:  Procedure Laterality Date  .  ABDOMINAL HYSTERECTOMY       Social History   reports that she has quit smoking. Her smoking use included cigarettes. She has never used smokeless tobacco. She reports that she does not drink alcohol or use drugs.   Family History   Her family history is negative for Breast cancer.   Allergies Allergies  Allergen Reactions  . Sulfa Antibiotics     Rash    . Aspirin Nausea Only     Home Medications  Prior to Admission medications   Medication Sig Start Date End Date Taking? Authorizing Provider  amLODipine (NORVASC) 10 MG tablet Take 10 mg by mouth daily. 01/08/19  Yes [provider]  cloNIDine (CATAPRES) 0.3 MG tablet Take 0.3 mg by mouth 2 (two) times daily.  02/11/19  Yes [provider]  Docusate Sodium (DSS) 100 MG CAPS Take 100 mg by mouth in the morning and at bedtime.  11/26/18 11/26/19 Yes [provider]  gabapentin (NEURONTIN) 100 MG capsule Take 100 mg by mouth daily. 01/28/19  Yes [provider]  lisinopril-hydrochlorothiazide (ZESTORETIC) 20-25 MG tablet Take 1 tablet by mouth daily. 01/08/19  Yes [provider]  simvastatin (ZOCOR) 10 MG tablet Take 10 mg by mouth at bedtime. 01/08/19  Yes [provider]     Signature:  Chesley Mires, MD [place] Pager - [phone] 06/28/2019, 1:52 PM

## 2019-06-28 NOTE — ED Provider Notes (Signed)
[facility] DEPT Provider Note: Georgena Spurling, MD, FACEP  CSN: [id number] MRN: [id number] ARRIVAL: 06/27/19 at [place]: [place]  Back Pain   HISTORY OF PRESENT ILLNESS  06/28/19 12:12 AM Alisha Goodman is a 74 y.o. female who has had low back pain on the left radiating down the front of her left leg.  She has been being treated by her PCP for lumbar radiculopathy since at least September 2020.  She denies any inciting injury.  She had previously had pain on the right which resolved with physical therapy.  She then developed left-sided pain.  She was sent to physical therapy by her physician but she discontinued this 2 weeks ago because the pain was not improving and has subsequently worsened.  She rates her pain as a 10 out of 10, aching in nature and worse with movement of the left leg.  She also has some weakness of the left leg but is still able to ambulate.  She has not been on narcotic pain medication for this.  Her last radiographs were done 10/29/2018 and showed no acute radiographic findings but multilevel degenerative changes and scoliosis.  She has also had a week of nausea and occasional vomiting with constipation but no dysuria, fever or diarrhea.  She has had no abdominal pain except when she vomits.  She has felt short of breath for the past week but attributes this to pain.  She was noted to have a low-grade fever of 99.3 in the ED with a heart rate of 131.  She has had associated generalized weakness.   History reviewed. No pertinent past medical history.  History reviewed. No pertinent surgical history.  Family History  Problem Relation Age of Onset  . Breast cancer Neg Hx     Social History   Tobacco Use  . Smoking status: Not on file  Substance Use Topics  . Alcohol use: Not on file  . Drug use: Not on file    Prior to Admission medications   Medication Sig Start Date End Date Taking? Authorizing Provider  amLODipine (NORVASC) 10  MG tablet Take 10 mg by mouth daily. 01/08/19  Yes [provider]  cloNIDine (CATAPRES) 0.3 MG tablet Take 0.3 mg by mouth 2 (two) times daily.  02/11/19  Yes [provider]  Docusate Sodium (DSS) 100 MG CAPS Take 100 mg by mouth in the morning and at bedtime.  11/26/18 11/26/19 Yes [provider]  gabapentin (NEURONTIN) 100 MG capsule Take 100 mg by mouth daily. 01/28/19  Yes [provider]  lisinopril-hydrochlorothiazide (ZESTORETIC) 20-25 MG tablet Take 1 tablet by mouth daily. 01/08/19  Yes [provider]  simvastatin (ZOCOR) 10 MG tablet Take 10 mg by mouth at bedtime. 01/08/19  Yes [provider]    Allergies Sulfa antibiotics and Aspirin   REVIEW OF SYSTEMS  Negative except as noted here or in the History of Present Illness.   PHYSICAL EXAMINATION  Initial Vital Signs Blood pressure 126/66, pulse (!) 131, temperature 99.3 F (37.4 C), temperature source Oral, resp. rate 18, SpO2 96 %.  Examination General: Well-developed, thin female in no acute distress; appearance consistent with age of record HENT: normocephalic; atraumatic Eyes: pupils equal, round and reactive to light; extraocular muscles intact Neck: supple Heart: regular rate and rhythm; tachycardia Lungs: clear to auscultation bilaterally Abdomen: soft; nondistended; nontender; bowel sounds present GU: No CVA tenderness Back: Pain on movement of left hip Extremities: No deformity; pulses normal;  no edema Neurologic: Awake, alert and oriented; motor function intact in all extremities but slightly weaker on the left; no facial droop Skin: Warm and dry Psychiatric: Normal mood and affect   RESULTS  Summary of this visit's results, reviewed and interpreted by myself:   EKG Interpretation  Date/Time:  Friday Jun 28 2019 00:40:47 EDT Ventricular Rate:  119 PR Interval:    QRS Duration: 124 QT Interval:  331 QTC Calculation: 466 R Axis:   75 Text  Interpretation: Normal sinus rhythm Left bundle branch block Left ventricular hypertrophy No previous ECGs available Confirmed by Nara Paternoster, Jenny Reichmann [phone]) on 06/28/2019 12:51:20 AM      Laboratory Studies: Results for orders placed or performed during the hospital encounter of 06/28/19 (from the past 24 hour(s))  Brain natriuretic peptide     Status: None   Collection Time: 06/28/19 12:28 AM  Result Value Ref Range   B Natriuretic Peptide 30.2 0.0 - 100.0 pg/mL  CBC with Differential/Platelet     Status: Abnormal   Collection Time: 06/28/19 12:28 AM  Result Value Ref Range   WBC 12.3 (H) 4.0 - 10.5 K/uL   RBC 4.41 3.87 - 5.11 MIL/uL   Hemoglobin 11.9 (L) 12.0 - 15.0 g/dL   HCT 36.7 36.0 - 46.0 %   MCV 83.2 80.0 - 100.0 fL   MCH 27.0 26.0 - 34.0 pg   MCHC 32.4 30.0 - 36.0 g/dL   RDW 14.6 11.5 - 15.5 %   Platelets 617 (H) 150 - 400 K/uL   nRBC 0.0 0.0 - 0.2 %   Neutrophils Relative % 78 %   Neutro Abs 9.5 (H) 1.7 - 7.7 K/uL   Lymphocytes Relative 13 %   Lymphs Abs 1.7 0.7 - 4.0 K/uL   Monocytes Relative 8 %   Monocytes Absolute 1.0 0.1 - 1.0 K/uL   Eosinophils Relative 0 %   Eosinophils Absolute 0.0 0.0 - 0.5 K/uL   Basophils Relative 0 %   Basophils Absolute 0.0 0.0 - 0.1 K/uL   Immature Granulocytes 1 %   Abs Immature Granulocytes 0.12 (H) 0.00 - 0.07 K/uL  Comprehensive metabolic panel     Status: Abnormal   Collection Time: 06/28/19 12:28 AM  Result Value Ref Range   Sodium 132 (L) 135 - 145 mmol/L   Potassium 3.7 3.5 - 5.1 mmol/L   Chloride 96 (L) 98 - 111 mmol/L   CO2 21 (L) 22 - 32 mmol/L   Glucose, Bld 105 (H) 70 - 99 mg/dL   BUN 20 8 - 23 mg/dL   Creatinine, Ser 1.11 (H) 0.44 - 1.00 mg/dL   Calcium 9.5 8.9 - 10.3 mg/dL   Total Protein 8.9 (H) 6.5 - 8.1 g/dL   Albumin 4.0 3.5 - 5.0 g/dL   AST 264 (H) 15 - 41 U/L   ALT 375 (H) 0 - 44 U/L   Alkaline Phosphatase 351 (H) 38 - 126 U/L   Total Bilirubin 1.0 0.3 - 1.2 mg/dL   GFR calc non Af Amer 50 (L) >60 mL/min   GFR  calc Af Amer 58 (L) >60 mL/min   Anion gap 15 5 - 15  Lipase, blood     Status: Abnormal   Collection Time: 06/28/19 12:28 AM  Result Value Ref Range   Lipase 325 (H) 11 - 51 U/L  SARS Coronavirus 2 by RT PCR (hospital order, performed in [facility] hospital lab) Nasopharyngeal Nasopharyngeal Swab     Status: None   Collection Time: 06/28/19  2:00 AM   Specimen: Nasopharyngeal Swab  Result Value Ref Range   SARS Coronavirus 2 NEGATIVE NEGATIVE  Urinalysis, Routine w reflex microscopic     Status: Abnormal   Collection Time: 06/28/19  3:30 AM  Result Value Ref Range   Color, Urine AMBER (A) YELLOW   APPearance CLOUDY (A) CLEAR   Specific Gravity, Urine 1.024 1.005 - 1.030   pH 7.0 5.0 - 8.0   Glucose, UA NEGATIVE NEGATIVE mg/dL   Hgb urine dipstick SMALL (A) NEGATIVE   Bilirubin Urine NEGATIVE NEGATIVE   Ketones, ur NEGATIVE NEGATIVE mg/dL   Protein, ur 100 (A) NEGATIVE mg/dL   Nitrite NEGATIVE NEGATIVE   Leukocytes,Ua LARGE (A) NEGATIVE   RBC / HPF 11-20 0 - 5 RBC/hpf   WBC, UA >50 (H) 0 - 5 WBC/hpf   Bacteria, UA MANY (A) NONE SEEN   Squamous Epithelial / LPF 0-5 0 - 5   WBC Clumps PRESENT    Mucus PRESENT   CBC     Status: Abnormal   Collection Time: 06/28/19  5:33 AM  Result Value Ref Range   WBC 10.8 (H) 4.0 - 10.5 K/uL   RBC 4.21 3.87 - 5.11 MIL/uL   Hemoglobin 11.6 (L) 12.0 - 15.0 g/dL   HCT 35.0 (L) 36.0 - 46.0 %   MCV 83.1 80.0 - 100.0 fL   MCH 27.6 26.0 - 34.0 pg   MCHC 33.1 30.0 - 36.0 g/dL   RDW 14.6 11.5 - 15.5 %   Platelets 540 (H) 150 - 400 K/uL   nRBC 0.0 0.0 - 0.2 %   Imaging Studies: DG Chest 2 View  Result Date: 06/28/2019 CLINICAL DATA:  Shortness of breath, weakness EXAM: CHEST - 2 VIEW COMPARISON:  Radiograph 06/22/2004 FINDINGS: Irregular dense opacity is seen along the right mediastinal border and projecting over the hilum with a possible hilum overlay sign suggesting either anterior or posterior mediastinal origins. However, suspect that this  process may be involving the right central airways given more peripheral wedge like opacity throughout the right lung which could reflect a postobstructive infectious or inflammatory process. Central pulmonary arteries are enlarged. Cardiac size within normal limits. No pneumothorax or visible effusion. No acute osseous or soft tissue abnormality. Telemetry leads overlie the chest. IMPRESSION: Irregular dense opacity along the right mediastinal border and projecting over the hilum with possible involvement of the airways given more peripheral opacity which could reflect a postobstructive process. Further evaluation with cross-sectional imaging is warranted. Central pulmonary arterial enlargement noted as well. May reflect pulmonary artery hypertension. Electronically Signed   By: Lovena Le M.D.   On: 06/28/2019 01:37   CT Chest W Contrast  Result Date: 06/28/2019 CLINICAL DATA:  Mediastinal mass on x-ray. Elevated LFTs. Left back pain. EXAM: CT CHEST, ABDOMEN, AND PELVIS WITH CONTRAST TECHNIQUE: Multidetector CT imaging of the chest, abdomen and pelvis was performed following the standard protocol during bolus administration of intravenous contrast. CONTRAST:  126m OMNIPAQUE IOHEXOL 300 MG/ML  SOLN COMPARISON:  Chest radiograph earlier this day. Remote abdominal CT 1126 FINDINGS: CT CHEST FINDINGS Cardiovascular: Aortic atherosclerosis. Aortic tortuosity without aneurysm. Heart is normal in size. Coronary artery calcifications. No pericardial effusion. Extensive mediastinal adenopathy causes mass effect and narrowing of the right pulmonary arteries. No obvious intraluminal filling defect. Mediastinum/Nodes: Extensive mediastinal and right hilar adenopathy, with confluent nodal conglomerate extending from the subcarinal station to the level of the mid trachea. Lymph nodes causes mass effect and narrowing of the right upper lobe  bronchus bronchus intermedius and both lower lobe bronchi. Representative  measurement at the level of the carina of 6.8 x 5.3 cm, series 1, image 23. Subcarinal nodal conglomerate measures 3.4 x 5.2 cm, series 1, image 30. Prevascular nodal mass measuring 5.4 x 2.3 cm, series 1, image 25. Left supraclavicular adenopathy measuring 2 x 2 cm, series 1, image 5. Additional adenopathy at multiple stations. Many of these lymph nodes are partially calcified and low-density/necrotic. There is no left hilar adenopathy. There is no axillary adenopathy. Slight mass effect on the esophagus from nodal conglomerate, which is difficult to delineate in the mid distal portion. No visualized thyroid nodule. Lungs/Pleura: There are multiple right pulmonary nodules. It is difficult to delineate nodules from adenopathy in the central right lung. Adenopathy is irregular borders. Multiple branching nodules in the right upper lobe which may represent lymphangitic spread, for example series 3, image 54. There is a 10 mm medial right lower lobe pulmonary nodule, series 3, image 73. 9 mm right lower lobe nodule series 3, image 87. There is pleural based nodularity in the posterior right upper lobe that is contiguous with adenopathy. Adenopathy causes mass effect and narrowing of the right upper lobe bronchus, bronchus intermedius and right middle and lower lobe bronchi. No significant pleural effusion. No discrete left lung pulmonary nodules.  Mild emphysema. Musculoskeletal: No blastic or destructive lytic lesions. Right chest wall metastasis, series 1, image 35. CT ABDOMEN PELVIS FINDINGS Hepatobiliary: Large irregular hypodense liver lesion in the inferior liver tip measures 6.1 x 4.3 cm. There is right greater than left intrahepatic biliary ductal dilatation, likely due to mass effect from periportal adenopathy on the common bile duct. Gallbladder is not well visualized. Pancreas: Heterogeneous mass in the pancreatic body measuring 4.3 x 2.5 cm. There is no distal atrophy. Mild proximal ductal dilatation of 5  mm. Spleen: Calcified granuloma.  Splenule inferiorly. Adrenals/Urinary Tract: Nodular thickening of the left adrenal gland up to 11 mm. Heterogeneous right adrenal mass measures 3.3 x 2.6 cm, increased in size from prior exam. There is prominent right renal parenchymal thinning with calyceal dilatation of the upper and lower poles. Large intra caliceal stones in the lower right kidney. There is no significant perinephric edema. Urinary bladder is distended but unremarkable. Stomach/Bowel: No obvious gastric mass. No small bowel obstruction or inflammatory change. High-density material throughout the colon may be contrast or bismuth containing products. Moderate stool proximally. No obvious colonic mass. Appendix is normal. Vascular/Lymphatic: Multifocal abdominal adenopathy. Periportal adenopathy with 3 x 2.3 cm lymph node, series 1, image 66. There are additional enlarged periportal nodes. Multiple enlarged upper abdominal nodes in the retroperitoneum and peripancreatic space. Representative right retroperitoneal node measures 3.0 x 1.8 cm, series 1, image 65. There is an enlarged central mesenteric node at 9 mm, series 1, image 75. Right retroperitoneal adenopathy causes mild mass effect on the IVC. There is a retrocrural node adjacent to the distal aorta. No pelvic adenopathy. Aortic and branch atherosclerosis. The portal vein is attenuated with mass effect from adjacent adenopathy. Reproductive: The uterus is not seen.  No obvious adnexal mass. Other: No ascites or omental thickening.  No free air. Musculoskeletal: Left paraspinal met measures 3.1 x 2.2 cm, series 168 at the level of L3-L4. No evidence of blastic or destructive lytic lesion. Scoliotic curvature of the spine. IMPRESSION: 1. 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 Site of primary malignancy is not definitively determined, however but favored to represent right lung primary. There is  also pancreatic lesion which may be  an alternative primary. Tissue sampling is recommended. 2. Bulky mediastinal and right hilar adenopathy, nodal conglomerate causes narrowing of the right upper lobe bronchus, right bronchus intermedius, upper and lower lobe bronchi. Multiple right-sided pulmonary nodules, including pleural based nodules posteriorly. 3. Large liver lesion suspicious for metastasis. There is biliary dilatation which is likely secondary to mass effect on the common bile duct from periportal adenopathy. 4. Multifocal abdominal adenopathy throughout the retroperitoneum, periportal region, central mesentery. 5. Pancreatic mass measuring 4.3 cm which may be metastasis or primary malignancy. 6. Right adrenal mass and left adrenal thickening. 7. Subcutaneous metastasis to the right chest wall. Left paraspinal intramuscular metastasis at the level of L3. 8. Non oncologic findings of right renal atrophy with large nonobstructing stones in the lower right kidney. Aortic Atherosclerosis (ICD10-I70.0) and Emphysema (ICD10-J43.9). Electronically Signed   By: Keith Rake M.D.   On: 06/28/2019 03:46   CT ABDOMEN PELVIS W CONTRAST  Result Date: 06/28/2019 CLINICAL DATA:  Mediastinal mass on x-ray. Elevated LFTs. Left back pain. EXAM: CT CHEST, ABDOMEN, AND PELVIS WITH CONTRAST TECHNIQUE: Multidetector CT imaging of the chest, abdomen and pelvis was performed following the standard protocol during bolus administration of intravenous contrast. CONTRAST:  174m OMNIPAQUE IOHEXOL 300 MG/ML  SOLN COMPARISON:  Chest radiograph earlier this day. Remote abdominal CT 1126 FINDINGS: CT CHEST FINDINGS Cardiovascular: Aortic atherosclerosis. Aortic tortuosity without aneurysm. Heart is normal in size. Coronary artery calcifications. No pericardial effusion. Extensive mediastinal adenopathy causes mass effect and narrowing of the right pulmonary arteries. No obvious intraluminal filling defect. Mediastinum/Nodes: Extensive mediastinal and right hilar  adenopathy, with confluent nodal conglomerate extending from the subcarinal station to the level of the mid trachea. Lymph nodes causes mass effect and narrowing of the right upper lobe bronchus bronchus intermedius and both lower lobe bronchi. Representative measurement at the level of the carina of 6.8 x 5.3 cm, series 1, image 23. Subcarinal nodal conglomerate measures 3.4 x 5.2 cm, series 1, image 30. Prevascular nodal mass measuring 5.4 x 2.3 cm, series 1, image 25. Left supraclavicular adenopathy measuring 2 x 2 cm, series 1, image 5. Additional adenopathy at multiple stations. Many of these lymph nodes are partially calcified and low-density/necrotic. There is no left hilar adenopathy. There is no axillary adenopathy. Slight mass effect on the esophagus from nodal conglomerate, which is difficult to delineate in the mid distal portion. No visualized thyroid nodule. Lungs/Pleura: There are multiple right pulmonary nodules. It is difficult to delineate nodules from adenopathy in the central right lung. Adenopathy is irregular borders. Multiple branching nodules in the right upper lobe which may represent lymphangitic spread, for example series 3, image 54. There is a 10 mm medial right lower lobe pulmonary nodule, series 3, image 73. 9 mm right lower lobe nodule series 3, image 87. There is pleural based nodularity in the posterior right upper lobe that is contiguous with adenopathy. Adenopathy causes mass effect and narrowing of the right upper lobe bronchus, bronchus intermedius and right middle and lower lobe bronchi. No significant pleural effusion. No discrete left lung pulmonary nodules.  Mild emphysema. Musculoskeletal: No blastic or destructive lytic lesions. Right chest wall metastasis, series 1, image 35. CT ABDOMEN PELVIS FINDINGS Hepatobiliary: Large irregular hypodense liver lesion in the inferior liver tip measures 6.1 x 4.3 cm. There is right greater than left intrahepatic biliary ductal  dilatation, likely due to mass effect from periportal adenopathy on the common bile duct. Gallbladder is not well  visualized. Pancreas: Heterogeneous mass in the pancreatic body measuring 4.3 x 2.5 cm. There is no distal atrophy. Mild proximal ductal dilatation of 5 mm. Spleen: Calcified granuloma.  Splenule inferiorly. Adrenals/Urinary Tract: Nodular thickening of the left adrenal gland up to 11 mm. Heterogeneous right adrenal mass measures 3.3 x 2.6 cm, increased in size from prior exam. There is prominent right renal parenchymal thinning with calyceal dilatation of the upper and lower poles. Large intra caliceal stones in the lower right kidney. There is no significant perinephric edema. Urinary bladder is distended but unremarkable. Stomach/Bowel: No obvious gastric mass. No small bowel obstruction or inflammatory change. High-density material throughout the colon may be contrast or bismuth containing products. Moderate stool proximally. No obvious colonic mass. Appendix is normal. Vascular/Lymphatic: Multifocal abdominal adenopathy. Periportal adenopathy with 3 x 2.3 cm lymph node, series 1, image 66. There are additional enlarged periportal nodes. Multiple enlarged upper abdominal nodes in the retroperitoneum and peripancreatic space. Representative right retroperitoneal node measures 3.0 x 1.8 cm, series 1, image 65. There is an enlarged central mesenteric node at 9 mm, series 1, image 75. Right retroperitoneal adenopathy causes mild mass effect on the IVC. There is a retrocrural node adjacent to the distal aorta. No pelvic adenopathy. Aortic and branch atherosclerosis. The portal vein is attenuated with mass effect from adjacent adenopathy. Reproductive: The uterus is not seen.  No obvious adnexal mass. Other: No ascites or omental thickening.  No free air. Musculoskeletal: Left paraspinal met measures 3.1 x 2.2 cm, series 168 at the level of L3-L4. No evidence of blastic or destructive lytic lesion.  Scoliotic curvature of the spine. IMPRESSION: 1. 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 Site of primary malignancy is not definitively determined, however but favored to represent right lung primary. There is also pancreatic lesion which may be an alternative primary. Tissue sampling is recommended. 2. Bulky mediastinal and right hilar adenopathy, nodal conglomerate causes narrowing of the right upper lobe bronchus, right bronchus intermedius, upper and lower lobe bronchi. Multiple right-sided pulmonary nodules, including pleural based nodules posteriorly. 3. Large liver lesion suspicious for metastasis. There is biliary dilatation which is likely secondary to mass effect on the common bile duct from periportal adenopathy. 4. Multifocal abdominal adenopathy throughout the retroperitoneum, periportal region, central mesentery. 5. Pancreatic mass measuring 4.3 cm which may be metastasis or primary malignancy. 6. Right adrenal mass and left adrenal thickening. 7. Subcutaneous metastasis to the right chest wall. Left paraspinal intramuscular metastasis at the level of L3. 8. Non oncologic findings of right renal atrophy with large nonobstructing stones in the lower right kidney. Aortic Atherosclerosis (ICD10-I70.0) and Emphysema (ICD10-J43.9). Electronically Signed   By: Keith Rake M.D.   On: 06/28/2019 03:46    ED COURSE and MDM  Nursing notes, initial and subsequent vitals signs, including pulse oximetry, reviewed and interpreted by myself.  Vitals:   06/28/19 0032 06/28/19 0100 06/28/19 0200 06/28/19 0600  BP: 139/88 (!) 142/77 132/79 (!) 105/93  Pulse: (!) 121 (!) 118 (!) 119 (!) 126  Resp: (!) 26  (!) 24 (!) 22  Temp: 98.7 F (37.1 C)     TempSrc: Oral     SpO2: 96% 96% 94% 94%  Weight: 58.5 kg     Height: _0  (1.651 m)      Medications  sodium chloride (PF) 0.9 % injection (has no administration in time range)  amLODipine (NORVASC) tablet 10 mg (has no  administration in time range)  cloNIDine (CATAPRES) tablet  0.3 mg (has no administration in time range)  gabapentin (NEURONTIN) capsule 100 mg (has no administration in time range)  heparin injection 5,000 Units (5,000 Units Subcutaneous Given 06/28/19 0607)  sodium chloride flush (NS) 0.9 % injection 3 mL (has no administration in time range)  0.9 %  sodium chloride infusion ( Intravenous New Bag/Given 06/28/19 0604)  acetaminophen (TYLENOL) tablet 650 mg (has no administration in time range)    Or  acetaminophen (TYLENOL) suppository 650 mg (has no administration in time range)  oxyCODONE (Oxy IR/ROXICODONE) immediate release tablet 5 mg (has no administration in time range)  ondansetron (ZOFRAN) tablet 4 mg (has no administration in time range)    Or  ondansetron (ZOFRAN) injection 4 mg (has no administration in time range)  HYDROmorphone (DILAUDID) injection 0.5-1 mg (has no administration in time range)  albuterol (PROVENTIL) (2.5 MG/3ML) 0.083% nebulizer solution 2.5 mg (has no administration in time range)  nicotine (NICODERM CQ - dosed in mg/24 hours) patch 21 mg (has no administration in time range)  cefTRIAXone (ROCEPHIN) 1 g in sodium chloride 0.9 % 100 mL IVPB (1 g Intravenous New Bag/Given 06/28/19 0605)  fentaNYL (SUBLIMAZE) injection 25 mcg (25 mcg Intravenous Given 06/28/19 0041)  iohexol (OMNIPAQUE) 300 MG/ML solution 100 mL (100 mLs Intravenous Contrast Given 06/28/19 0248)  fentaNYL (SUBLIMAZE) injection 25 mcg (25 mcg Intravenous Given 06/28/19 0346)   4:02 AM Will have patient admitted to the hospitalist service for work-up of what appears to be widespread metastatic cancer.  The primary is unclear at this time.  4:12 AM Patient and husband advised of diagnosis of metastatic cancer.  Will have patient admitted to the hospitalist service.   PROCEDURES  Procedures   ED DIAGNOSES     ICD-10-CM   1. 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  Lower urinary tract infectious disease  N39.0        Payson Evrard, MD 06/28/19 [phone]

## 2019-06-28 NOTE — H&P (Signed)
History and Physical    Alisha Goodman ZOX:[id number] DOB: 08/31/1949 DOA: 06/28/2019  PCP: Jolinda Croak, MD  Patient coming from: Home   I have personally briefly reviewed patient's old medical records in [place]  Chief Complaint: Back pain /pelvic pain  HPI: Alisha Goodman is a 74 y.o. female with medical history significant of  Of HTN , tobacco abuse ,lumbar radiculopathy who presents to ed with progression of left back and leg pain that has been progressive x 2 weeks.per husband who aides in history over the last 4 days pain has been intense and due to to this patient has not been able to sleep. Patient notes no associated nausea or low appetite, and currently denies sob associated with her symptoms. She states symptoms have continue to progress and pain has become unbearable so she presented to ed for evaluation. On further ros she notes no fever no chills, no cough or uri sxs, no chest pain, no abdominal pain, no dysuria. She however has noted weight loss and associated weakness and muscle spasm in affected left leg. She notes her left leg/back pain began close to one year ago and initially improved with PT , but on her second round of PT her pain was not improved.  ED Course:  Vitals afeb, bp 109/68, hr 119, rr18 sat 96% on ra  Labs Wbc 12.3, hgb11.9, plt 617, NA 132, bicarb 21, cr 1.11 no base available, alphos375, ast264 BNP 30.2 EKG:NSR LBBB no prior ekg to compare  UA:+wbc,bacteria   Cxr: Irregular dense opacity along the right mediastinal border and projecting over the hilum with possible involvement of the airways given more peripheral opacity which could reflect a postobstructive process. Further evaluation with cross-sectional imaging is warranted. Central pulmonary arterial enlargement noted as well. May reflect pulmonary artery hypertension.  CT chest: 1. 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 Site of primary malignancy  is not definitively determined, however but favored to represent right lung primary. There is also pancreatic lesion which may be an alternative primary. Tissue sampling is recommended. 2. Bulky mediastinal and right hilar adenopathy, nodal conglomerate causes narrowing of the right upper lobe bronchus, right bronchus intermedius, upper and lower lobe bronchi. Multiple right-sided pulmonary nodules, including pleural based nodules posteriorly. 3. Large liver lesion suspicious for metastasis. There is biliary dilatation which is likely secondary to mass effect on the common bile duct from periportal adenopathy. 4. Multifocal abdominal adenopathy throughout the retroperitoneum, periportal region, central mesentery. 5. Pancreatic mass measuring 4.3 cm which may be metastasis or primary malignancy. 6. Right adrenal mass and left adrenal thickening. 7. Subcutaneous metastasis to the right chest wall. Left paraspinal intramuscular metastasis at the level of L3. 8. Non oncologic findings of right renal atrophy with large nonobstructing stones in the lower right kidney.  WJ:[id number]/FAOZ Review of Systems: As per HPI otherwise 10 point review of systems negative.   History reviewed. No pertinent past medical history. See above  History reviewed. No pertinent surgical history.   has no history on file for tobacco, alcohol, and drug. +tobacco  Allergies  Allergen Reactions   Sulfa Antibiotics     Rash     Aspirin Nausea Only    Family History  Problem Relation Age of Onset   Breast cancer Neg Hx     Prior to Admission medications   Medication Sig Start Date End Date Taking? Authorizing Provider  amLODipine (NORVASC) 10 MG tablet Take 10 mg by mouth daily.  01/08/19  Yes [provider]  cloNIDine (CATAPRES) 0.3 MG tablet Take 0.3 mg by mouth 2 (two) times daily.  02/11/19  Yes [provider]  Docusate Sodium (DSS) 100 MG CAPS Take 100 mg by mouth in the morning  and at bedtime.  11/26/18 11/26/19 Yes [provider]  gabapentin (NEURONTIN) 100 MG capsule Take 100 mg by mouth daily. 01/28/19  Yes [provider]  lisinopril-hydrochlorothiazide (ZESTORETIC) 20-25 MG tablet Take 1 tablet by mouth daily. 01/08/19  Yes [provider]  simvastatin (ZOCOR) 10 MG tablet Take 10 mg by mouth at bedtime. 01/08/19  Yes [provider]    Physical Exam: Vitals:   06/28/19 0031 06/28/19 0032 06/28/19 0100 06/28/19 0200  BP: 139/88 139/88 (!) 142/77 132/79  Pulse: (!) 57 (!) 121 (!) 118 (!) 119  Resp: 19 (!) 26  (!) 24  Temp:  98.7 F (37.1 C)    TempSrc:  Oral    SpO2: 95% 96% 96% 94%  Weight:  58.5 kg    Height:  5' 5" (1.651 m)       Vitals:   06/28/19 0031 06/28/19 0032 06/28/19 0100 06/28/19 0200  BP: 139/88 139/88 (!) 142/77 132/79  Pulse: (!) 57 (!) 121 (!) 118 (!) 119  Resp: 19 (!) 26  (!) 24  Temp:  98.7 F (37.1 C)    TempSrc:  Oral    SpO2: 95% 96% 96% 94%  Weight:  58.5 kg    Height:  5' 5" (1.651 m)    Constitutional: NAD, calm, comfortable Eyes: PERRL, lids and conjunctivae normal ENMT: Mucous membranes are moist. Posterior pharynx clear of any exudate or lesions.Normal dentition.  Neck: normal, supple, no masses, no thyromegaly Respiratory: clear to auscultation bilaterally, no wheezing, no crackles. Normal respiratory effort. No accessory muscle use.  Cardiovascular: Regular rate and rhythm, no murmurs / rubs / gallops. No extremity edema. 2+ pedal pulses. No carotid bruits.  Abdomen: no tenderness, no masses palpated. No hepatosplenomegaly. Bowel sounds positive.  Musculoskeletal: no clubbing / cyanosis. No joint deformity upper and lower extremities. Good ROM, no contractures. Negative str raise, no pain on palpation of spine or back muscle on my exam   Skin: no rashes, lesions, ulcers. No induration Neurologic: CN 2-12 grossly intact. Sensation intact,. Strength 5/5 in all 4.  Psychiatric:  Normal judgment and insight. Alert and oriented x 3. Normal mood.    Labs on Admission: I have personally reviewed following labs and imaging studies  CBC: Recent Labs  Lab 06/28/19 0028  WBC 12.3*  NEUTROABS 9.5*  HGB 11.9*  HCT 36.7  MCV 83.2  PLT 409*   Basic Metabolic Panel: Recent Labs  Lab 06/28/19 0028  NA 132*  K 3.7  CL 96*  CO2 21*  GLUCOSE 105*  BUN 20  CREATININE 1.11*  CALCIUM 9.5   GFR: Estimated Creatinine Clearance: 42.4 mL/min (A) (by C-G formula based on SCr of 1.11 mg/dL (H)). Liver Function Tests: Recent Labs  Lab 06/28/19 0028  AST 264*  ALT 375*  ALKPHOS 351*  BILITOT 1.0  PROT 8.9*  ALBUMIN 4.0   No results for input(s): LIPASE, AMYLASE in the last 168 hours. No results for input(s): AMMONIA in the last 168 hours. Coagulation Profile: No results for input(s): INR, PROTIME in the last 168 hours. Cardiac Enzymes: No results for input(s): CKTOTAL, CKMB, CKMBINDEX, TROPONINI in the last 168 hours. BNP (last 3 results) No results for input(s): PROBNP in the last 8760 hours.  HbA1C: No results for input(s): HGBA1C in the last 72 hours. CBG: No results for input(s): GLUCAP in the last 168 hours. Lipid Profile: No results for input(s): CHOL, HDL, LDLCALC, TRIG, CHOLHDL, LDLDIRECT in the last 72 hours. Thyroid Function Tests: No results for input(s): TSH, T4TOTAL, FREET4, T3FREE, THYROIDAB in the last 72 hours. Anemia Panel: No results for input(s): VITAMINB12, FOLATE, FERRITIN, TIBC, IRON, RETICCTPCT in the last 72 hours. Urine analysis:    Component Value Date/Time   COLORURINE AMBER (A) 06/28/2019 0330   APPEARANCEUR CLOUDY (A) 06/28/2019 0330   LABSPEC 1.024 06/28/2019 0330   PHURINE 7.0 06/28/2019 0330   GLUCOSEU NEGATIVE 06/28/2019 0330   HGBUR SMALL (A) 06/28/2019 0330   BILIRUBINUR NEGATIVE 06/28/2019 0330   KETONESUR NEGATIVE 06/28/2019 0330   PROTEINUR 100 (A) 06/28/2019 0330   NITRITE NEGATIVE 06/28/2019 0330    LEUKOCYTESUR LARGE (A) 06/28/2019 0330    Radiological Exams on Admission: DG Chest 2 View  Result Date: 06/28/2019 CLINICAL DATA:  Shortness of breath, weakness EXAM: CHEST - 2 VIEW COMPARISON:  Radiograph 06/22/2004 FINDINGS: Irregular dense opacity is seen along the right mediastinal border and projecting over the hilum with a possible hilum overlay sign suggesting either anterior or posterior mediastinal origins. However, suspect that this process may be involving the right central airways given more peripheral wedge like opacity throughout the right lung which could reflect a postobstructive infectious or inflammatory process. Central pulmonary arteries are enlarged. Cardiac size within normal limits. No pneumothorax or visible effusion. No acute osseous or soft tissue abnormality. Telemetry leads overlie the chest. IMPRESSION: Irregular dense opacity along the right mediastinal border and projecting over the hilum with possible involvement of the airways given more peripheral opacity which could reflect a postobstructive process. Further evaluation with cross-sectional imaging is warranted. Central pulmonary arterial enlargement noted as well. May reflect pulmonary artery hypertension. Electronically Signed   By: Lovena Le M.D.   On: 06/28/2019 01:37   CT Chest W Contrast  Result Date: 06/28/2019 CLINICAL DATA:  Mediastinal mass on x-ray. Elevated LFTs. Left back pain. EXAM: CT CHEST, ABDOMEN, AND PELVIS WITH CONTRAST TECHNIQUE: Multidetector CT imaging of the chest, abdomen and pelvis was performed following the standard protocol during bolus administration of intravenous contrast. CONTRAST:  110m OMNIPAQUE IOHEXOL 300 MG/ML  SOLN COMPARISON:  Chest radiograph earlier this day. Remote abdominal CT 1126 FINDINGS: CT CHEST FINDINGS Cardiovascular: Aortic atherosclerosis. Aortic tortuosity without aneurysm. Heart is normal in size. Coronary artery calcifications. No pericardial effusion. Extensive  mediastinal adenopathy causes mass effect and narrowing of the right pulmonary arteries. No obvious intraluminal filling defect. Mediastinum/Nodes: Extensive mediastinal and right hilar adenopathy, with confluent nodal conglomerate extending from the subcarinal station to the level of the mid trachea. Lymph nodes causes mass effect and narrowing of the right upper lobe bronchus bronchus intermedius and both lower lobe bronchi. Representative measurement at the level of the carina of 6.8 x 5.3 cm, series 1, image 23. Subcarinal nodal conglomerate measures 3.4 x 5.2 cm, series 1, image 30. Prevascular nodal mass measuring 5.4 x 2.3 cm, series 1, image 25. Left supraclavicular adenopathy measuring 2 x 2 cm, series 1, image 5. Additional adenopathy at multiple stations. Many of these lymph nodes are partially calcified and low-density/necrotic. There is no left hilar adenopathy. There is no axillary adenopathy. Slight mass effect on the esophagus from nodal conglomerate, which is difficult to delineate in the mid distal portion. No visualized thyroid nodule. Lungs/Pleura: There are multiple right pulmonary nodules. It  is difficult to delineate nodules from adenopathy in the central right lung. Adenopathy is irregular borders. Multiple branching nodules in the right upper lobe which may represent lymphangitic spread, for example series 3, image 54. There is a 10 mm medial right lower lobe pulmonary nodule, series 3, image 73. 9 mm right lower lobe nodule series 3, image 87. There is pleural based nodularity in the posterior right upper lobe that is contiguous with adenopathy. Adenopathy causes mass effect and narrowing of the right upper lobe bronchus, bronchus intermedius and right middle and lower lobe bronchi. No significant pleural effusion. No discrete left lung pulmonary nodules.  Mild emphysema. Musculoskeletal: No blastic or destructive lytic lesions. Right chest wall metastasis, series 1, image 35. CT ABDOMEN  PELVIS FINDINGS Hepatobiliary: Large irregular hypodense liver lesion in the inferior liver tip measures 6.1 x 4.3 cm. There is right greater than left intrahepatic biliary ductal dilatation, likely due to mass effect from periportal adenopathy on the common bile duct. Gallbladder is not well visualized. Pancreas: Heterogeneous mass in the pancreatic body measuring 4.3 x 2.5 cm. There is no distal atrophy. Mild proximal ductal dilatation of 5 mm. Spleen: Calcified granuloma.  Splenule inferiorly. Adrenals/Urinary Tract: Nodular thickening of the left adrenal gland up to 11 mm. Heterogeneous right adrenal mass measures 3.3 x 2.6 cm, increased in size from prior exam. There is prominent right renal parenchymal thinning with calyceal dilatation of the upper and lower poles. Large intra caliceal stones in the lower right kidney. There is no significant perinephric edema. Urinary bladder is distended but unremarkable. Stomach/Bowel: No obvious gastric mass. No small bowel obstruction or inflammatory change. High-density material throughout the colon may be contrast or bismuth containing products. Moderate stool proximally. No obvious colonic mass. Appendix is normal. Vascular/Lymphatic: Multifocal abdominal adenopathy. Periportal adenopathy with 3 x 2.3 cm lymph node, series 1, image 66. There are additional enlarged periportal nodes. Multiple enlarged upper abdominal nodes in the retroperitoneum and peripancreatic space. Representative right retroperitoneal node measures 3.0 x 1.8 cm, series 1, image 65. There is an enlarged central mesenteric node at 9 mm, series 1, image 75. Right retroperitoneal adenopathy causes mild mass effect on the IVC. There is a retrocrural node adjacent to the distal aorta. No pelvic adenopathy. Aortic and branch atherosclerosis. The portal vein is attenuated with mass effect from adjacent adenopathy. Reproductive: The uterus is not seen.  No obvious adnexal mass. Other: No ascites or omental  thickening.  No free air. Musculoskeletal: Left paraspinal met measures 3.1 x 2.2 cm, series 168 at the level of L3-L4. No evidence of blastic or destructive lytic lesion. Scoliotic curvature of the spine. IMPRESSION: 1. 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 Site of primary malignancy is not definitively determined, however but favored to represent right lung primary. There is also pancreatic lesion which may be an alternative primary. Tissue sampling is recommended. 2. Bulky mediastinal and right hilar adenopathy, nodal conglomerate causes narrowing of the right upper lobe bronchus, right bronchus intermedius, upper and lower lobe bronchi. Multiple right-sided pulmonary nodules, including pleural based nodules posteriorly. 3. Large liver lesion suspicious for metastasis. There is biliary dilatation which is likely secondary to mass effect on the common bile duct from periportal adenopathy. 4. Multifocal abdominal adenopathy throughout the retroperitoneum, periportal region, central mesentery. 5. Pancreatic mass measuring 4.3 cm which may be metastasis or primary malignancy. 6. Right adrenal mass and left adrenal thickening. 7. Subcutaneous metastasis to the right chest wall. Left paraspinal intramuscular metastasis at  the level of L3. 8. Non oncologic findings of right renal atrophy with large nonobstructing stones in the lower right kidney. Aortic Atherosclerosis (ICD10-I70.0) and Emphysema (ICD10-J43.9). Electronically Signed   By: Keith Rake M.D.   On: 06/28/2019 03:46   CT ABDOMEN PELVIS W CONTRAST  Result Date: 06/28/2019 CLINICAL DATA:  Mediastinal mass on x-ray. Elevated LFTs. Left back pain. EXAM: CT CHEST, ABDOMEN, AND PELVIS WITH CONTRAST TECHNIQUE: Multidetector CT imaging of the chest, abdomen and pelvis was performed following the standard protocol during bolus administration of intravenous contrast. CONTRAST:  172m OMNIPAQUE IOHEXOL 300 MG/ML  SOLN COMPARISON:   Chest radiograph earlier this day. Remote abdominal CT 1126 FINDINGS: CT CHEST FINDINGS Cardiovascular: Aortic atherosclerosis. Aortic tortuosity without aneurysm. Heart is normal in size. Coronary artery calcifications. No pericardial effusion. Extensive mediastinal adenopathy causes mass effect and narrowing of the right pulmonary arteries. No obvious intraluminal filling defect. Mediastinum/Nodes: Extensive mediastinal and right hilar adenopathy, with confluent nodal conglomerate extending from the subcarinal station to the level of the mid trachea. Lymph nodes causes mass effect and narrowing of the right upper lobe bronchus bronchus intermedius and both lower lobe bronchi. Representative measurement at the level of the carina of 6.8 x 5.3 cm, series 1, image 23. Subcarinal nodal conglomerate measures 3.4 x 5.2 cm, series 1, image 30. Prevascular nodal mass measuring 5.4 x 2.3 cm, series 1, image 25. Left supraclavicular adenopathy measuring 2 x 2 cm, series 1, image 5. Additional adenopathy at multiple stations. Many of these lymph nodes are partially calcified and low-density/necrotic. There is no left hilar adenopathy. There is no axillary adenopathy. Slight mass effect on the esophagus from nodal conglomerate, which is difficult to delineate in the mid distal portion. No visualized thyroid nodule. Lungs/Pleura: There are multiple right pulmonary nodules. It is difficult to delineate nodules from adenopathy in the central right lung. Adenopathy is irregular borders. Multiple branching nodules in the right upper lobe which may represent lymphangitic spread, for example series 3, image 54. There is a 10 mm medial right lower lobe pulmonary nodule, series 3, image 73. 9 mm right lower lobe nodule series 3, image 87. There is pleural based nodularity in the posterior right upper lobe that is contiguous with adenopathy. Adenopathy causes mass effect and narrowing of the right upper lobe bronchus, bronchus  intermedius and right middle and lower lobe bronchi. No significant pleural effusion. No discrete left lung pulmonary nodules.  Mild emphysema. Musculoskeletal: No blastic or destructive lytic lesions. Right chest wall metastasis, series 1, image 35. CT ABDOMEN PELVIS FINDINGS Hepatobiliary: Large irregular hypodense liver lesion in the inferior liver tip measures 6.1 x 4.3 cm. There is right greater than left intrahepatic biliary ductal dilatation, likely due to mass effect from periportal adenopathy on the common bile duct. Gallbladder is not well visualized. Pancreas: Heterogeneous mass in the pancreatic body measuring 4.3 x 2.5 cm. There is no distal atrophy. Mild proximal ductal dilatation of 5 mm. Spleen: Calcified granuloma.  Splenule inferiorly. Adrenals/Urinary Tract: Nodular thickening of the left adrenal gland up to 11 mm. Heterogeneous right adrenal mass measures 3.3 x 2.6 cm, increased in size from prior exam. There is prominent right renal parenchymal thinning with calyceal dilatation of the upper and lower poles. Large intra caliceal stones in the lower right kidney. There is no significant perinephric edema. Urinary bladder is distended but unremarkable. Stomach/Bowel: No obvious gastric mass. No small bowel obstruction or inflammatory change. High-density material throughout the colon may be contrast or bismuth containing  products. Moderate stool proximally. No obvious colonic mass. Appendix is normal. Vascular/Lymphatic: Multifocal abdominal adenopathy. Periportal adenopathy with 3 x 2.3 cm lymph node, series 1, image 66. There are additional enlarged periportal nodes. Multiple enlarged upper abdominal nodes in the retroperitoneum and peripancreatic space. Representative right retroperitoneal node measures 3.0 x 1.8 cm, series 1, image 65. There is an enlarged central mesenteric node at 9 mm, series 1, image 75. Right retroperitoneal adenopathy causes mild mass effect on the IVC. There is a  retrocrural node adjacent to the distal aorta. No pelvic adenopathy. Aortic and branch atherosclerosis. The portal vein is attenuated with mass effect from adjacent adenopathy. Reproductive: The uterus is not seen.  No obvious adnexal mass. Other: No ascites or omental thickening.  No free air. Musculoskeletal: Left paraspinal met measures 3.1 x 2.2 cm, series 168 at the level of L3-L4. No evidence of blastic or destructive lytic lesion. Scoliotic curvature of the spine. IMPRESSION: 1. 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 Site of primary malignancy is not definitively determined, however but favored to represent right lung primary. There is also pancreatic lesion which may be an alternative primary. Tissue sampling is recommended. 2. Bulky mediastinal and right hilar adenopathy, nodal conglomerate causes narrowing of the right upper lobe bronchus, right bronchus intermedius, upper and lower lobe bronchi. Multiple right-sided pulmonary nodules, including pleural based nodules posteriorly. 3. Large liver lesion suspicious for metastasis. There is biliary dilatation which is likely secondary to mass effect on the common bile duct from periportal adenopathy. 4. Multifocal abdominal adenopathy throughout the retroperitoneum, periportal region, central mesentery. 5. Pancreatic mass measuring 4.3 cm which may be metastasis or primary malignancy. 6. Right adrenal mass and left adrenal thickening. 7. Subcutaneous metastasis to the right chest wall. Left paraspinal intramuscular metastasis at the level of L3. 8. Non oncologic findings of right renal atrophy with large nonobstructing stones in the lower right kidney. Aortic Atherosclerosis (ICD10-I70.0) and Emphysema (ICD10-J43.9). Electronically Signed   By: Keith Rake M.D.   On: 06/28/2019 03:46    EKG: Independently reviewed. 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  --oncology to called in the am to decide on best approach for sampling   -supportive care with pain management /continue gabapentin  Presenting pelvic pain / Radiculopathy with left lower extremity weakness  Most likely related to mets noted above   Compression of Bronchus by Mediastinal lymph nodes  -pulmonary consult-evaluate wether patient is a candidate for a stent  -patient at this time appears asx -please call in am   Abnormal lfts  -due to liver mets   UTI -CTX  -f/u on cultures   Abn EKG  -LBBB -cycle ce to be complete -most likely old / most likely with undiagnosed CAD  - echo  For baseline    HLD  -hold statin currently   Tobacco abuse  - offered support for cessation   FEN Mild hyponatremia  -? Mild dehydration/ medication on hctz/ SIADH due to lung findings -trial of ivfs overnight  Stable replete lytes prn   Social  S/p complete dx may consider referral to palliative care to assist with discussion re goal of care   DVT prophylaxis: heparin  Code Status:Full Family Communication:husband at bedside Disposition Plan:  24-72 hours Consults called: pulmonary/oncology to be called in am  Admission status: inpatient  Clance Boll MD [facility]  If 7PM-7AM, please contact night-coverage [email or web address] Password [email or web address]  06/28/2019, 4:59 AM

## 2019-06-28 NOTE — Progress Notes (Signed)
[place]  [phone]   HEMATOLOGY ONCOLOGY INPATIENT CONSULTATION   SHIVONNE SCHWARTZMAN  DOB: 08/31/1949  MR#: [id number]  CSN#: [id number]    Requesting Physician: [facility]  Patient Care Team: Jolinda Croak, MD as PCP - General (Family Medicine)  Reason for consult: probably metastatic malignancy   History of present illness:   Ms. Bayley is a 74 year old female with past medical history of hypertension, heavy smoker, presented with progressive of left back and leg pain, CT scan showed bulky mediastinal and right hilar adenopathy, multiple right-sided pulmonary nodules, large liver lesion, and a pancreatic mass.  I was called to evaluate patient further diagnostic work-up and further management.  Patient states she developed right thigh pain about a year ago, was seen by primary care physician and received physical therapy.  That has improved.  She noticed worsening left hip and thigh pain about a few weeks ago, especially with position change and walking.  Due to the worsening pain, she presented to the emergency room today.  She also reports intermittent hoarseness for the past year, and a total about 10 pound weight loss in the past year.  She denies any significant dysphagia, cough, chest pain, or dyspnea.  No fever or chills.  MEDICAL HISTORY:  Past Medical History:  Diagnosis Date  . Hyperlipidemia   . Hypertension   . Lumbar radiculopathy     SURGICAL HISTORY: Past Surgical History:  Procedure Laterality Date  . ABDOMINAL HYSTERECTOMY      SOCIAL HISTORY: Social History   Socioeconomic History  . Marital status: Married    Spouse name: Not on file  . Number of children: Not on file  . Years of education: Not on file  . Highest education level: Not on file  Occupational History  . Not on file  Tobacco Use  . Smoking status: Former Smoker    Types: Cigarettes  . Smokeless tobacco: Never Used  Substance and Sexual Activity   . Alcohol use: Never  . Drug use: Never  . Sexual activity: Not Currently  Other Topics Concern  . Not on file  Social History Narrative  . Not on file   Social Determinants of Health   Financial Resource Strain:   . Difficulty of Paying Living Expenses:   Food Insecurity:   . Worried About Charity fundraiser in the Last Year:   . Arboriculturist in the Last Year:   Transportation Needs:   . Film/video editor (Medical):   Marland Kitchen Lack of Transportation (Non-Medical):   Physical Activity:   . Days of Exercise per Week:   . Minutes of Exercise per Session:   Stress:   . Feeling of Stress :   Social Connections:   . Frequency of Communication with Friends and Family:   . Frequency of Social Gatherings with Friends and Family:   . Attends Religious Services:   . Active Member of Clubs or Organizations:   . Attends Archivist Meetings:   Marland Kitchen Marital Status:   Intimate Partner Violence:   . Fear of Current or Ex-Partner:   . Emotionally Abused:   Marland Kitchen Physically Abused:   . Sexually Abused:     FAMILY HISTORY: Family History  Problem Relation Age of Onset  . Breast cancer Neg Hx     ALLERGIES:  is allergic to sulfa antibiotics and aspirin.  MEDICATIONS:  Current Facility-Administered Medications  Medication Dose Route Frequency Provider Last Rate Last Admin  .  0.9 %  sodium chloride infusion   Intravenous Continuous Clance Boll, MD 75 mL/hr at 06/28/19 0604 New Bag at 06/28/19 0604  . acetaminophen (TYLENOL) tablet 650 mg  650 mg Oral Q6H PRN Clance Boll, MD       Or  . acetaminophen (TYLENOL) suppository 650 mg  650 mg Rectal Q6H PRN Myles Rosenthal A, MD      . albuterol (PROVENTIL) (2.5 MG/3ML) 0.083% nebulizer solution 2.5 mg  2.5 mg Nebulization Q2H PRN Myles Rosenthal A, MD      . amLODipine (NORVASC) tablet 10 mg  10 mg Oral Daily Myles Rosenthal A, MD   10 mg at 06/28/19 0941  . cloNIDine (CATAPRES) tablet 0.3 mg  0.3 mg Oral BID  Myles Rosenthal A, MD   0.3 mg at 06/28/19 0940  . gabapentin (NEURONTIN) capsule 100 mg  100 mg Oral Daily Myles Rosenthal A, MD      . heparin injection 5,000 Units  5,000 Units Subcutaneous Q8H Clance Boll, MD   5,000 Units at 06/28/19 1332  . HYDROmorphone (DILAUDID) injection 0.5-1 mg  0.5-1 mg Intravenous Q4H PRN Myles Rosenthal A, MD   1 mg at 06/28/19 1113  . lidocaine (XYLOCAINE) 1 % (with pres) injection           . nicotine (NICODERM CQ - dosed in mg/24 hours) patch 21 mg  21 mg Transdermal Daily Myles Rosenthal A, MD   21 mg at 06/28/19 0942  . ondansetron (ZOFRAN) tablet 4 mg  4 mg Oral Q6H PRN Clance Boll, MD       Or  . ondansetron [facility]) injection 4 mg  4 mg Intravenous Q6H PRN Myles Rosenthal A, MD      . oxyCODONE (Oxy IR/ROXICODONE) immediate release tablet 5 mg  5 mg Oral Q4H PRN Myles Rosenthal A, MD      . sodium chloride flush (NS) 0.9 % injection 3 mL  3 mL Intravenous Q12H Clance Boll, MD   3 mL at 06/28/19 7342   Current Outpatient Medications  Medication Sig Dispense Refill  . amLODipine (NORVASC) 10 MG tablet Take 10 mg by mouth daily.    . cloNIDine (CATAPRES) 0.3 MG tablet Take 0.3 mg by mouth 2 (two) times daily.     Mariane Baumgarten Sodium (DSS) 100 MG CAPS Take 100 mg by mouth in the morning and at bedtime.     . gabapentin (NEURONTIN) 100 MG capsule Take 100 mg by mouth daily.    Marland Kitchen lisinopril-hydrochlorothiazide (ZESTORETIC) 20-25 MG tablet Take 1 tablet by mouth daily.    . simvastatin (ZOCOR) 10 MG tablet Take 10 mg by mouth at bedtime.      REVIEW OF SYSTEMS:   Constitutional: Denies fevers, chills or abnormal night sweats, (+) weight loss Eyes: Denies blurriness of vision, double vision or watery eyes Ears, nose, mouth, throat, and face: Denies mucositis or sore throat Respiratory: Denies cough, dyspnea or wheezes Cardiovascular: Denies palpitation, chest discomfort or lower extremity swelling Gastrointestinal:  Denies  nausea, heartburn or change in bowel habits Skin: Denies abnormal skin rashes Lymphatics: Denies new lymphadenopathy or easy bruising Neurological:(+) significant left hip and thigh pain Behavioral/Psych: Mood is stable, no new changes  All other systems were reviewed with the patient and are negative.  PHYSICAL EXAMINATION: ECOG PERFORMANCE STATUS: 3 - Symptomatic, >50% confined to bed  Vitals:   06/28/19 1400 06/28/19 1602  BP: 139/79 135/81  Pulse: (!) 117 (!) 122  Resp: Marland Kitchen)  24 (!) 25  Temp:    SpO2: 98% 99%   Filed Weights   06/28/19 0032  Weight: 129 lb (58.5 kg)    GENERAL:alert, no distress and comfortable SKIN: skin color, texture, turgor are normal, no rashes or significant lesions EYES: normal, conjunctiva are pink and non-injected, sclera clear NECK: supple, thyroid normal size, non-tender, without nodularity, (+) palpable 1.5 cm lymph nodes in the left supraclavicular area LYMPH:  no palpable lymphadenopathy in the cervical, axillary or inguinal except left [place] node  LUNGS: clear to auscultation and percussion with normal breathing effort HEART: regular rate & rhythm and no murmurs and no lower extremity edema ABDOMEN:abdomen soft, non-tender and normal bowel sounds Musculoskeletal:no cyanosis of digits and no clubbing  PSYCH: alert & oriented x 3 with fluent speech NEURO: no focal motor/sensory deficits  LABORATORY DATA:  I have reviewed the data as listed Lab Results  Component Value Date   WBC 10.8 (H) 06/28/2019   HGB 11.6 (L) 06/28/2019   HCT 35.0 (L) 06/28/2019   MCV 83.1 06/28/2019   PLT 540 (H) 06/28/2019   Recent Labs    06/28/19 0028 06/28/19 0533  NA 132*  --   K 3.7  --   CL 96*  --   CO2 21*  --   GLUCOSE 105*  --   BUN 20  --   CREATININE 1.11* 0.92  CALCIUM 9.5  --   GFRNONAA 50* >60  GFRAA 58* >60  PROT 8.9*  --   ALBUMIN 4.0  --   AST 264*  --   ALT 375*  --   ALKPHOS 351*  --   BILITOT 1.0  --     RADIOGRAPHIC STUDIES: I  have personally reviewed the radiological images as listed and agreed with the findings in the report. DG Chest 2 View  Result Date: 06/28/2019 CLINICAL DATA:  Shortness of breath, weakness EXAM: CHEST - 2 VIEW COMPARISON:  Radiograph 06/22/2004 FINDINGS: Irregular dense opacity is seen along the right mediastinal border and projecting over the hilum with a possible hilum overlay sign suggesting either anterior or posterior mediastinal origins. However, suspect that this process may be involving the right central airways given more peripheral wedge like opacity throughout the right lung which could reflect a postobstructive infectious or inflammatory process. Central pulmonary arteries are enlarged. Cardiac size within normal limits. No pneumothorax or visible effusion. No acute osseous or soft tissue abnormality. Telemetry leads overlie the chest. IMPRESSION: Irregular dense opacity along the right mediastinal border and projecting over the hilum with possible involvement of the airways given more peripheral opacity which could reflect a postobstructive process. Further evaluation with cross-sectional imaging is warranted. Central pulmonary arterial enlargement noted as well. May reflect pulmonary artery hypertension. Electronically Signed   By: Lovena Le M.D.   On: 06/28/2019 01:37   CT Chest W Contrast  Result Date: 06/28/2019 CLINICAL DATA:  Mediastinal mass on x-ray. Elevated LFTs. Left back pain. EXAM: CT CHEST, ABDOMEN, AND PELVIS WITH CONTRAST TECHNIQUE: Multidetector CT imaging of the chest, abdomen and pelvis was performed following the standard protocol during bolus administration of intravenous contrast. CONTRAST:  164m OMNIPAQUE IOHEXOL 300 MG/ML  SOLN COMPARISON:  Chest radiograph earlier this day. Remote abdominal CT 1126 FINDINGS: CT CHEST FINDINGS Cardiovascular: Aortic atherosclerosis. Aortic tortuosity without aneurysm. Heart is normal in size. Coronary artery calcifications. No  pericardial effusion. Extensive mediastinal adenopathy causes mass effect and narrowing of the right pulmonary arteries. No obvious intraluminal filling defect. Mediastinum/Nodes: Extensive  mediastinal and right hilar adenopathy, with confluent nodal conglomerate extending from the subcarinal station to the level of the mid trachea. Lymph nodes causes mass effect and narrowing of the right upper lobe bronchus bronchus intermedius and both lower lobe bronchi. Representative measurement at the level of the carina of 6.8 x 5.3 cm, series 1, image 23. Subcarinal nodal conglomerate measures 3.4 x 5.2 cm, series 1, image 30. Prevascular nodal mass measuring 5.4 x 2.3 cm, series 1, image 25. Left supraclavicular adenopathy measuring 2 x 2 cm, series 1, image 5. Additional adenopathy at multiple stations. Many of these lymph nodes are partially calcified and low-density/necrotic. There is no left hilar adenopathy. There is no axillary adenopathy. Slight mass effect on the esophagus from nodal conglomerate, which is difficult to delineate in the mid distal portion. No visualized thyroid nodule. Lungs/Pleura: There are multiple right pulmonary nodules. It is difficult to delineate nodules from adenopathy in the central right lung. Adenopathy is irregular borders. Multiple branching nodules in the right upper lobe which may represent lymphangitic spread, for example series 3, image 54. There is a 10 mm medial right lower lobe pulmonary nodule, series 3, image 73. 9 mm right lower lobe nodule series 3, image 87. There is pleural based nodularity in the posterior right upper lobe that is contiguous with adenopathy. Adenopathy causes mass effect and narrowing of the right upper lobe bronchus, bronchus intermedius and right middle and lower lobe bronchi. No significant pleural effusion. No discrete left lung pulmonary nodules.  Mild emphysema. Musculoskeletal: No blastic or destructive lytic lesions. Right chest wall metastasis,  series 1, image 35. CT ABDOMEN PELVIS FINDINGS Hepatobiliary: Large irregular hypodense liver lesion in the inferior liver tip measures 6.1 x 4.3 cm. There is right greater than left intrahepatic biliary ductal dilatation, likely due to mass effect from periportal adenopathy on the common bile duct. Gallbladder is not well visualized. Pancreas: Heterogeneous mass in the pancreatic body measuring 4.3 x 2.5 cm. There is no distal atrophy. Mild proximal ductal dilatation of 5 mm. Spleen: Calcified granuloma.  Splenule inferiorly. Adrenals/Urinary Tract: Nodular thickening of the left adrenal gland up to 11 mm. Heterogeneous right adrenal mass measures 3.3 x 2.6 cm, increased in size from prior exam. There is prominent right renal parenchymal thinning with calyceal dilatation of the upper and lower poles. Large intra caliceal stones in the lower right kidney. There is no significant perinephric edema. Urinary bladder is distended but unremarkable. Stomach/Bowel: No obvious gastric mass. No small bowel obstruction or inflammatory change. High-density material throughout the colon may be contrast or bismuth containing products. Moderate stool proximally. No obvious colonic mass. Appendix is normal. Vascular/Lymphatic: Multifocal abdominal adenopathy. Periportal adenopathy with 3 x 2.3 cm lymph node, series 1, image 66. There are additional enlarged periportal nodes. Multiple enlarged upper abdominal nodes in the retroperitoneum and peripancreatic space. Representative right retroperitoneal node measures 3.0 x 1.8 cm, series 1, image 65. There is an enlarged central mesenteric node at 9 mm, series 1, image 75. Right retroperitoneal adenopathy causes mild mass effect on the IVC. There is a retrocrural node adjacent to the distal aorta. No pelvic adenopathy. Aortic and branch atherosclerosis. The portal vein is attenuated with mass effect from adjacent adenopathy. Reproductive: The uterus is not seen.  No obvious adnexal  mass. Other: No ascites or omental thickening.  No free air. Musculoskeletal: Left paraspinal met measures 3.1 x 2.2 cm, series 168 at the level of L3-L4. No evidence of blastic or destructive lytic lesion. Scoliotic  curvature of the spine. IMPRESSION: 1. 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 Site of primary malignancy is not definitively determined, however but favored to represent right lung primary. There is also pancreatic lesion which may be an alternative primary. Tissue sampling is recommended. 2. Bulky mediastinal and right hilar adenopathy, nodal conglomerate causes narrowing of the right upper lobe bronchus, right bronchus intermedius, upper and lower lobe bronchi. Multiple right-sided pulmonary nodules, including pleural based nodules posteriorly. 3. Large liver lesion suspicious for metastasis. There is biliary dilatation which is likely secondary to mass effect on the common bile duct from periportal adenopathy. 4. Multifocal abdominal adenopathy throughout the retroperitoneum, periportal region, central mesentery. 5. Pancreatic mass measuring 4.3 cm which may be metastasis or primary malignancy. 6. Right adrenal mass and left adrenal thickening. 7. Subcutaneous metastasis to the right chest wall. Left paraspinal intramuscular metastasis at the level of L3. 8. Non oncologic findings of right renal atrophy with large nonobstructing stones in the lower right kidney. Aortic Atherosclerosis (ICD10-I70.0) and Emphysema (ICD10-J43.9). Electronically Signed   By: Keith Rake M.D.   On: 06/28/2019 03:46   CT ABDOMEN PELVIS W CONTRAST  Result Date: 06/28/2019 CLINICAL DATA:  Mediastinal mass on x-ray. Elevated LFTs. Left back pain. EXAM: CT CHEST, ABDOMEN, AND PELVIS WITH CONTRAST TECHNIQUE: Multidetector CT imaging of the chest, abdomen and pelvis was performed following the standard protocol during bolus administration of intravenous contrast. CONTRAST:  179m OMNIPAQUE  IOHEXOL 300 MG/ML  SOLN COMPARISON:  Chest radiograph earlier this day. Remote abdominal CT 1126 FINDINGS: CT CHEST FINDINGS Cardiovascular: Aortic atherosclerosis. Aortic tortuosity without aneurysm. Heart is normal in size. Coronary artery calcifications. No pericardial effusion. Extensive mediastinal adenopathy causes mass effect and narrowing of the right pulmonary arteries. No obvious intraluminal filling defect. Mediastinum/Nodes: Extensive mediastinal and right hilar adenopathy, with confluent nodal conglomerate extending from the subcarinal station to the level of the mid trachea. Lymph nodes causes mass effect and narrowing of the right upper lobe bronchus bronchus intermedius and both lower lobe bronchi. Representative measurement at the level of the carina of 6.8 x 5.3 cm, series 1, image 23. Subcarinal nodal conglomerate measures 3.4 x 5.2 cm, series 1, image 30. Prevascular nodal mass measuring 5.4 x 2.3 cm, series 1, image 25. Left supraclavicular adenopathy measuring 2 x 2 cm, series 1, image 5. Additional adenopathy at multiple stations. Many of these lymph nodes are partially calcified and low-density/necrotic. There is no left hilar adenopathy. There is no axillary adenopathy. Slight mass effect on the esophagus from nodal conglomerate, which is difficult to delineate in the mid distal portion. No visualized thyroid nodule. Lungs/Pleura: There are multiple right pulmonary nodules. It is difficult to delineate nodules from adenopathy in the central right lung. Adenopathy is irregular borders. Multiple branching nodules in the right upper lobe which may represent lymphangitic spread, for example series 3, image 54. There is a 10 mm medial right lower lobe pulmonary nodule, series 3, image 73. 9 mm right lower lobe nodule series 3, image 87. There is pleural based nodularity in the posterior right upper lobe that is contiguous with adenopathy. Adenopathy causes mass effect and narrowing of the right  upper lobe bronchus, bronchus intermedius and right middle and lower lobe bronchi. No significant pleural effusion. No discrete left lung pulmonary nodules.  Mild emphysema. Musculoskeletal: No blastic or destructive lytic lesions. Right chest wall metastasis, series 1, image 35. CT ABDOMEN PELVIS FINDINGS Hepatobiliary: Large irregular hypodense liver lesion in the inferior liver tip  measures 6.1 x 4.3 cm. There is right greater than left intrahepatic biliary ductal dilatation, likely due to mass effect from periportal adenopathy on the common bile duct. Gallbladder is not well visualized. Pancreas: Heterogeneous mass in the pancreatic body measuring 4.3 x 2.5 cm. There is no distal atrophy. Mild proximal ductal dilatation of 5 mm. Spleen: Calcified granuloma.  Splenule inferiorly. Adrenals/Urinary Tract: Nodular thickening of the left adrenal gland up to 11 mm. Heterogeneous right adrenal mass measures 3.3 x 2.6 cm, increased in size from prior exam. There is prominent right renal parenchymal thinning with calyceal dilatation of the upper and lower poles. Large intra caliceal stones in the lower right kidney. There is no significant perinephric edema. Urinary bladder is distended but unremarkable. Stomach/Bowel: No obvious gastric mass. No small bowel obstruction or inflammatory change. High-density material throughout the colon may be contrast or bismuth containing products. Moderate stool proximally. No obvious colonic mass. Appendix is normal. Vascular/Lymphatic: Multifocal abdominal adenopathy. Periportal adenopathy with 3 x 2.3 cm lymph node, series 1, image 66. There are additional enlarged periportal nodes. Multiple enlarged upper abdominal nodes in the retroperitoneum and peripancreatic space. Representative right retroperitoneal node measures 3.0 x 1.8 cm, series 1, image 65. There is an enlarged central mesenteric node at 9 mm, series 1, image 75. Right retroperitoneal adenopathy causes mild mass effect  on the IVC. There is a retrocrural node adjacent to the distal aorta. No pelvic adenopathy. Aortic and branch atherosclerosis. The portal vein is attenuated with mass effect from adjacent adenopathy. Reproductive: The uterus is not seen.  No obvious adnexal mass. Other: No ascites or omental thickening.  No free air. Musculoskeletal: Left paraspinal met measures 3.1 x 2.2 cm, series 168 at the level of L3-L4. No evidence of blastic or destructive lytic lesion. Scoliotic curvature of the spine. IMPRESSION: 1. 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 Site of primary malignancy is not definitively determined, however but favored to represent right lung primary. There is also pancreatic lesion which may be an alternative primary. Tissue sampling is recommended. 2. Bulky mediastinal and right hilar adenopathy, nodal conglomerate causes narrowing of the right upper lobe bronchus, right bronchus intermedius, upper and lower lobe bronchi. Multiple right-sided pulmonary nodules, including pleural based nodules posteriorly. 3. Large liver lesion suspicious for metastasis. There is biliary dilatation which is likely secondary to mass effect on the common bile duct from periportal adenopathy. 4. Multifocal abdominal adenopathy throughout the retroperitoneum, periportal region, central mesentery. 5. Pancreatic mass measuring 4.3 cm which may be metastasis or primary malignancy. 6. Right adrenal mass and left adrenal thickening. 7. Subcutaneous metastasis to the right chest wall. Left paraspinal intramuscular metastasis at the level of L3. 8. Non oncologic findings of right renal atrophy with large nonobstructing stones in the lower right kidney. Aortic Atherosclerosis (ICD10-I70.0) and Emphysema (ICD10-J43.9). Electronically Signed   By: Keith Rake M.D.   On: 06/28/2019 03:46   ECHOCARDIOGRAM COMPLETE  Result Date: 06/28/2019    ECHOCARDIOGRAM REPORT   Patient Name:   ARTHUR AYDELOTTE Date of  Exam: 06/28/2019 Medical Rec #:  [id number]       Height:       65.0 in Accession #:    [id number]      Weight:       129.0 lb Date of Birth:  08/31/1949        BSA:          1.642 m Patient Age:    4 years  BP:           133/81 mmHg Patient Gender: F               HR:           115 bpm. Exam Location:  Inpatient Procedure: 2D Echo, Cardiac Doppler, Color Doppler and Intracardiac            Opacification Agent Indications:    Chest Pain 786.50 / R07.9  History:        Patient has no prior history of Echocardiogram examinations.                 Risk Factors:Former Smoker.  Sonographer:    Vickie Epley RDCS Referring Phys: [id number] [facility] A THOM[facility]  Sonographer Comments: Suboptimal parasternal window. IMPRESSIONS  1. No apical thrombus by Definity contrast. Left ventricular ejection fraction, by estimation, is 20 to 25%. The left ventricle has severely decreased function. The left ventricle demonstrates global hypokinesis. The left ventricular internal cavity size was mildly dilated. indeterminate diastolic function, due to what appears to be atrial flutter.  2. Right ventricular systolic function is hyperdynamic. The right ventricular size is normal.  3. The mitral valve is grossly normal. No evidence of mitral valve regurgitation.  4. The aortic valve was not well visualized. Aortic valve regurgitation is not visualized. Mild aortic valve sclerosis is present, with no evidence of aortic valve stenosis.  5. The inferior vena cava is normal in size with greater than 50% respiratory variability, suggesting right atrial pressure of 3 mmHg. Conclusion(s)/Recommendation(s): Rhythm appears to be atrial flutter, I have also reviewed the EKG which demonstrates flutter waves. FINDINGS  Left Ventricle: No apical thrombus by Definity contrast. Left ventricular ejection fraction, by estimation, is 20 to 25%. The left ventricle has severely decreased function. The left ventricle demonstrates global hypokinesis. Definity  contrast agent was  given IV to delineate the left ventricular endocardial borders. The left ventricular internal cavity size was mildly dilated. There is no left ventricular hypertrophy. Left ventricular diastolic function could not be evaluated due to possible atrial flutter. Indeterminate diastolic function, due to what appears to be atrial flutter. Right Ventricle: The right ventricular size is normal. No increase in right ventricular wall thickness. Right ventricular systolic function is hyperdynamic. Left Atrium: Left atrial size was normal in size. Right Atrium: Right atrial size was normal in size. Pericardium: There is no evidence of pericardial effusion. Mitral Valve: The mitral valve is grossly normal. No evidence of mitral valve regurgitation. Tricuspid Valve: The tricuspid valve is grossly normal. Tricuspid valve regurgitation is not demonstrated. Aortic Valve: The aortic valve was not well visualized. Aortic valve regurgitation is not visualized. Mild aortic valve sclerosis is present, with no evidence of aortic valve stenosis. Pulmonic Valve: The pulmonic valve was normal in structure. Pulmonic valve regurgitation is not visualized. Aorta: The aortic root and ascending aorta are structurally normal, with no evidence of dilitation. Venous: The inferior vena cava is normal in size with greater than 50% respiratory variability, suggesting right atrial pressure of 3 mmHg. IAS/Shunts: No atrial level shunt detected by color flow Doppler.  LEFT VENTRICLE PLAX 2D LVIDd:         4.04 cm LVIDs:         3.62 cm LV PW:         0.76 cm LV IVS:        0.77 cm LVOT diam:     1.90 cm LV SV:  48 LV SV Index:   29 LVOT Area:     2.84 cm  LV Volumes (MOD) LV vol d, MOD A2C: 71.0 ml LV vol d, MOD A4C: 68.8 ml LV vol s, MOD A2C: 49.9 ml LV vol s, MOD A4C: 60.4 ml LV SV MOD A2C:     21.1 ml LV SV MOD A4C:     68.8 ml LV SV MOD BP:      14.9 ml RIGHT VENTRICLE TAPSE (M-mode): 2.2 cm LEFT ATRIUM             Index        RIGHT ATRIUM          Index LA diam:        2.50 cm 1.52 cm/m  RA Area:     8.27 cm LA Vol (A2C):   31.1 ml 18.94 ml/m RA Volume:   15.30 ml 9.32 ml/m LA Vol (A4C):   18.0 ml 10.96 ml/m LA Biplane Vol: 23.7 ml 14.44 ml/m  AORTIC VALVE LVOT Vmax:   113.00 cm/s LVOT Vmean:  73.900 cm/s LVOT VTI:    0.169 m  AORTA Ao Root diam: 2.90 cm  SHUNTS Systemic VTI:  0.17 m Systemic Diam: 1.90 cm Lyman Bishop MD Electronically signed by Lyman Bishop MD Signature Date/Time: 06/28/2019/3:34:15 PM    Final    [place] CORE BIOPSY (LYMPH NODES)  Result Date: 06/28/2019 INDICATION: 74 year old female with a 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 ANESTHESIA/SEDATION: None FLUOROSCOPY TIME:  None COMPLICATIONS: None PROCEDURE: Informed written consent was obtained from the patient after a thorough discussion of the procedural risks, benefits and alternatives. All questions were addressed. Maximal Sterile Barrier Technique was utilized including caps, mask, sterile gowns, sterile gloves, sterile drape, hand hygiene and skin antiseptic. A timeout was performed prior to the initiation of the procedure. Ultrasound survey was performed with images stored and sent to PACs. The left neck was prepped with chlorhexidine in a sterile fashion, and a sterile drape was applied covering the operative field. A sterile gown and sterile gloves were used for the procedure. Local anesthesia was provided with 1% Lidocaine. Ultrasound guidance was used to infiltrate the region with 1% lidocaine for local anesthesia. A small stab incision was made with 11 blade scalpel. Using ultrasound guidance, multiple 18 gauge core biopsy were acquired of the pathologic left supraclavicular node. These were placed in day fresh specimen container. Final image was stored after biopsy. Patient tolerated the procedure well and remained hemodynamically  stable throughout. No complications were encountered and no significant blood loss was encounter IMPRESSION: Status post ultrasound-guided biopsy of left supraclavicular pathologic node. Signed, Dulcy Fanny. Dellia Nims, RPVI [facility] Electronically Signed   By: Corrie Mckusick D.O.   On: 06/28/2019 17:11    ASSESSMENT & PLAN:  74 year old African-American female  1.  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  Primary is likely lung cancer or pancreatic cancer. 2.  Left hip and thigh pain, possible related to the left paraspinal metastatic lesion 3. Weight loss   Recommendations: -I recommend IR ultrasound-guided left supraclavicular lymph node biopsy.  This is palpable.  I spoke with IR Dr. 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  -If her pain is controlled in  the next few days, it is okay to discharge from oncology standpoint.  I will be back to office on Wednesday, and I will follow-up the pathology results and see her back in my office next week.  If her pain is not able to well controlled, please consult rad/onc over the weekend, to consider urgent radiation. she will benefit from palliative radiation to the left paraspinal mass to improve her leg pain. Otherwise I will send a urgent referral to radiation oncology and see her as outpt next week.  -Please call us over the weekend for urgent issues. -I discussed with Dr. Sarajane Jews  -I spoke wit her son at bedside, with pt's permission    All questions were answered. The patient knows to call the clinic with any problems, questions or concerns.      Truitt Merle, MD 06/28/2019 5:26 PM

## 2019-06-28 NOTE — Progress Notes (Signed)
MEDICATION-RELATED CONSULT NOTE   IR Procedure Consult - Anticoagulant/Antiplatelet PTA/Inpatient Med List Review by Pharmacist    Procedure: US guided biopsy of lymph node    Completed: 06/28/19 ~1700  Post-Procedural bleeding risk per IR MD assessment:  low  Antithrombotic medications on inpatient or PTA profile prior to procedure:   SQ heparin    Recommended restart time per IR Post-Procedure Guidelines:  Day 0 (at least 4 hours or at next standard dose interval)   Other considerations:      Plan:    SQ heparin 5000 units q8 to restart at 2200 as already ordered.   Adrian Saran, PharmD, BCPS 06/28/2019 5:57 PM

## 2019-06-29 ENCOUNTER — Encounter (HOSPITAL_COMMUNITY): Payer: Self-pay | Admitting: Internal Medicine

## 2019-06-29 DIAGNOSIS — I4892 Unspecified atrial flutter: Secondary | ICD-10-CM

## 2019-06-29 DIAGNOSIS — R7989 Other specified abnormal findings of blood chemistry: Secondary | ICD-10-CM

## 2019-06-29 DIAGNOSIS — I5022 Chronic systolic (congestive) heart failure: Secondary | ICD-10-CM

## 2019-06-29 DIAGNOSIS — I5021 Acute systolic (congestive) heart failure: Secondary | ICD-10-CM | POA: Diagnosis present

## 2019-06-29 DIAGNOSIS — I700 Atherosclerosis of aorta: Secondary | ICD-10-CM

## 2019-06-29 DIAGNOSIS — C800 Disseminated malignant neoplasm, unspecified: Secondary | ICD-10-CM

## 2019-06-29 DIAGNOSIS — J439 Emphysema, unspecified: Secondary | ICD-10-CM

## 2019-06-29 DIAGNOSIS — E871 Hypo-osmolality and hyponatremia: Secondary | ICD-10-CM

## 2019-06-29 HISTORY — DX: Chronic systolic (congestive) heart failure: I50.22

## 2019-06-29 HISTORY — DX: Unspecified atrial flutter: I48.92

## 2019-06-29 HISTORY — DX: Atherosclerosis of aorta: I70.0

## 2019-06-29 HISTORY — DX: Emphysema, unspecified: J43.9

## 2019-06-29 LAB — COMPREHENSIVE METABOLIC PANEL
ALT: 294 U/L — ABNORMAL HIGH (ref 0–44)
AST: 213 U/L — ABNORMAL HIGH (ref 15–41)
Albumin: 2.9 g/dL — ABNORMAL LOW (ref 3.5–5.0)
Alkaline Phosphatase: 313 U/L — ABNORMAL HIGH (ref 38–126)
Anion gap: 10 (ref 5–15)
BUN: 13 mg/dL (ref 8–23)
CO2: 19 mmol/L — ABNORMAL LOW (ref 22–32)
Calcium: 8.1 mg/dL — ABNORMAL LOW (ref 8.9–10.3)
Chloride: 97 mmol/L — ABNORMAL LOW (ref 98–111)
Creatinine, Ser: 0.76 mg/dL (ref 0.44–1.00)
GFR calc Af Amer: >60 mL/min (ref >60)
GFR calc non Af Amer: >60 mL/min (ref >60)
Glucose, Bld: 91 mg/dL (ref 70–99)
Potassium: 3.2 mmol/L — ABNORMAL LOW (ref 3.5–5.1)
Sodium: 126 mmol/L — ABNORMAL LOW (ref 135–145)
Total Bilirubin: 2.1 mg/dL — ABNORMAL HIGH (ref 0.3–1.2)
Total Protein: 7.1 g/dL (ref 6.5–8.1)

## 2019-06-29 LAB — CBC
HCT: 31.4 % — ABNORMAL LOW (ref 36.0–46.0)
Hemoglobin: 10.2 g/dL — ABNORMAL LOW (ref 12.0–15.0)
MCH: 27.3 pg (ref 26.0–34.0)
MCHC: 32.5 g/dL (ref 30.0–36.0)
MCV: 84 fL (ref 80.0–100.0)
Platelets: 473 10*3/uL — ABNORMAL HIGH (ref 150–400)
RBC: 3.74 MIL/uL — ABNORMAL LOW (ref 3.87–5.11)
RDW: 14.9 % (ref 11.5–15.5)
WBC: 11.3 10*3/uL — ABNORMAL HIGH (ref 4.0–10.5)
nRBC: 0 % (ref 0.0–0.2)

## 2019-06-29 MED ORDER — SENNA 8.6 MG PO TABS
1.0000 | ORAL_TABLET | Freq: Every day | ORAL | Status: DC
  Administered 2019-06-29: 8.6 mg via ORAL
  Filled 2019-06-29: qty 1

## 2019-06-29 MED ORDER — ENSURE ENLIVE PO LIQD
237.0000 mL | Freq: Two times a day (BID) | ORAL | Status: DC
  Administered 2019-06-30 – 2019-07-02 (×5): 237 mL via ORAL

## 2019-06-29 MED ORDER — BISACODYL 10 MG RE SUPP
10.0000 mg | Freq: Once | RECTAL | Status: AC
  Administered 2019-06-29: 10 mg via RECTAL
  Filled 2019-06-29: qty 1

## 2019-06-29 MED ORDER — ADULT MULTIVITAMIN W/MINERALS CH
1.0000 | ORAL_TABLET | Freq: Every day | ORAL | Status: DC
  Administered 2019-06-29 – 2019-07-02 (×4): 1 via ORAL
  Filled 2019-06-29 (×4): qty 1

## 2019-06-29 MED ORDER — POLYETHYLENE GLYCOL 3350 17 G PO PACK
17.0000 g | PACK | Freq: Two times a day (BID) | ORAL | Status: DC
  Administered 2019-06-29 – 2019-07-02 (×4): 17 g via ORAL
  Filled 2019-06-29 (×4): qty 1

## 2019-06-29 MED ORDER — METOPROLOL TARTRATE 25 MG PO TABS
25.0000 mg | ORAL_TABLET | Freq: Two times a day (BID) | ORAL | Status: DC
  Administered 2019-06-29 – 2019-07-01 (×5): 25 mg via ORAL
  Filled 2019-06-29 (×5): qty 1

## 2019-06-29 MED ORDER — OXYCODONE HCL 5 MG PO TABS
5.0000 mg | ORAL_TABLET | Freq: Four times a day (QID) | ORAL | Status: DC
  Administered 2019-06-29 – 2019-06-30 (×4): 5 mg via ORAL
  Filled 2019-06-29 (×4): qty 1

## 2019-06-29 MED ORDER — SODIUM CHLORIDE 0.9 % IV SOLN: INTRAVENOUS | Status: AC

## 2019-06-29 MED ORDER — CARVEDILOL 3.125 MG PO TABS: 3.1250 mg | ORAL_TABLET | Freq: Two times a day (BID) | ORAL | Status: DC

## 2019-06-29 NOTE — Progress Notes (Signed)
Initial Nutrition Assessment  RD working remotely.  DOCUMENTATION CODES:   Not applicable  INTERVENTION:   -Ensure Enlive po BID, each supplement provides 350 kcal and 20 grams of protein -MVI with minerals daily -Magic cup TID with meals, each supplement provides 290 kcal and 9 grams of protein  NUTRITION DIAGNOSIS:   Increased nutrient needs related to cancer and cancer related treatments as evidenced by estimated needs.  GOAL:   Patient will meet greater than or equal to 90% of their needs  MONITOR:   PO intake, Supplement acceptance, Labs, Weight trends, Skin, I & O's  REASON FOR ASSESSMENT:   Consult Assessment of nutrition requirement/status  ASSESSMENT:   74 year old woman PMH lumbar radiculopathy, smoker, presented to the emergency department with progressive left leg pain for several weeks.  Pt admitted with metastatic cancer with likely primary of rt lung vs pancreas.   5/28- s/p US guided biopsy of pathologic left supraclavicular lymph node  Reviewed I/O's: +1.2 L x 24 hours and +1.2 L since admission  Attempted to speak with pt via phone, however, no answer.   Pt with metastatic malignancy to mediastinal and abdominal nodes, lung, liver, and pancreatic mass (primary cancer likely lung or pancreas). Awaiting biopsy results to direct further management.   Per MD notes, pt has reported a 10-15 pound weight loss over the past month, however, no wt hx available at this time to confirm this.   Pt with fair intake; noted meal completion 50%. Suspect pt with malnutrition, however, unable to identify at this time. Pt would greatly benefit from addition of oral nutrition supplements.   Medications reviewed and include heparin, miralax, senna, and 0.9% sodium chloride infusion @ 75 ml/hr.   Labs reviewed: Na: 126, K: 3.2.   Diet Order:   Diet Order            Diet Heart Room service appropriate? Yes; Fluid consistency: Thin  Diet effective now               EDUCATION NEEDS:   No education needs have been identified at this time  Skin:  Skin Assessment: Reviewed RN Assessment  Last BM:  06/24/19  Height:   Ht Readings from Last 1 Encounters:  06/28/19 5\' 5"  (1.651 m)    Weight:   Wt Readings from Last 1 Encounters:  06/28/19 58.5 kg    Ideal Body Weight:  56.8 kg  BMI:  Body mass index is 21.47 kg/m.  Estimated Nutritional Needs:   Kcal:  9030-0923  Protein:  90-105 grams  Fluid:  > 1.7 L    Loistine Chance, RD, LDN, [place] Registered Dietitian II Certified Diabetes Care and Education Specialist Please refer to [facility] for RD and/or RD on-call/weekend/after hours pager

## 2019-06-29 NOTE — Evaluation (Signed)
Physical Therapy Evaluation Patient Details Name: Alisha Goodman MRN: [id number] DOB: 08/31/1949 Today's Date: 06/29/2019   History of Present Illness  Alisha Goodman is a 74 year old female with past medical history of hypertension, heavy smoker, presented with progressive of left back and leg pain, CT scan showed bulky mediastinal and right hilar adenopathy, multiple right-sided pulmonary nodules, large liver lesion, and a pancreatic mass.  Clinical Impression  Pt admitted with above diagnosis.  Pt currently with functional limitations due to the deficits listed below (see PT Problem List). Pt will benefit from skilled PT to increase their independence and safety with mobility to allow discharge to the venue listed below.  Pt's pain decreased to 3/10 with gait with RW vs when she WBs without AD when she stated it would be an 8/10.  Discussed using front stairs with rail for home entry with pt and son. Recommend HHPT, RW, and 3-1 BSC.  Depending on the progression of her disease and treatment, may need to consider a w/c or transport chair for longer distances.     Follow Up Recommendations Home health PT;Supervision for mobility/OOB    Equipment Recommendations  Rolling walker with 5" wheels;3in1 (PT);Other (comment)(possibly transport chair?)    Recommendations for Other Services       Precautions / Restrictions Precautions Precautions: Fall Restrictions Weight Bearing Restrictions: No      Mobility  Bed Mobility               General bed mobility comments: sitting EOB upon arrival  Transfers Overall transfer level: Needs assistance Equipment used: Rolling walker (2 wheeled) Transfers: Sit to/from Stand Sit to Stand: Min guard         General transfer comment: cues for safe hand placement  Ambulation/Gait Ambulation/Gait assistance: Min guard Gait Distance (Feet): 35 Feet Assistive device: Rolling walker (2 wheeled) Gait Pattern/deviations: Decreased stance time -  left;Step-to pattern;Trunk flexed Gait velocity: decreased   General Gait Details: Pt cued for proper sequence to decrease WB on L LE. Pt putting < 25% of her weight through L LE. O2 98% on RA and HR 126  Stairs            Wheelchair Mobility    Modified Rankin (Stroke Patients Only)       Balance Overall balance assessment: Needs assistance   Sitting balance-Leahy Scale: Good       Standing balance-Leahy Scale: Fair                               Pertinent Vitals/Pain Pain Assessment: 0-10 Pain Score: 8  Pain Location: L thigh Pain Descriptors / Indicators: Grimacing;Guarding Pain Intervention(s): Limited activity within patient's tolerance;Monitored during session;Repositioned    Home Living Family/patient expects to be discharged to:: Private residence Living Arrangements: Spouse/significant other Available Help at Discharge: Family;Available 24 hours/day Type of Home: House Home Access: Stairs to enter   [employer] of Steps: 2 at back with no rail which is normal entrance, but 3 at front with rail Home Layout: Two level;Able to live on main level with bedroom/bathroom Home Equipment: None      Prior Function Level of Independence: Independent               Hand Dominance        Extremity/Trunk Assessment   Upper Extremity Assessment Upper Extremity Assessment: Overall WFL for tasks assessed    Lower Extremity Assessment Lower Extremity Assessment: LLE deficits/detail LLE:  Unable to fully assess due to pain    Cervical / Trunk Assessment Cervical / Trunk Assessment: Normal  Communication   Communication: No difficulties  Cognition Arousal/Alertness: Awake/alert Behavior During Therapy: WFL for tasks assessed/performed Overall Cognitive Status: Within Functional Limits for tasks assessed                                        General Comments      Exercises     Assessment/Plan    PT  Assessment Patient needs continued PT services  PT Problem List Decreased activity tolerance;Decreased balance;Decreased strength;Decreased mobility;Pain;Cardiopulmonary status limiting activity       PT Treatment Interventions DME instruction;Gait training;Stair training;Functional mobility training;Neuromuscular re-education;Balance training;Therapeutic exercise;Therapeutic activities    PT Goals (Current goals can be found in the Care Plan section)  Acute Rehab PT Goals Patient Stated Goal: decrease pain PT Goal Formulation: With patient/family Time For Goal Achievement: 07/13/19 Potential to Achieve Goals: Good    Frequency Min 3X/week   Barriers to discharge        Co-evaluation               AM-PAC PT "6 Clicks" Mobility  Outcome Measure Help needed turning from your back to your side while in a flat bed without using bedrails?: A Little Help needed moving from lying on your back to sitting on the side of a flat bed without using bedrails?: A Little Help needed moving to and from a bed to a chair (including a wheelchair)?: A Little Help needed standing up from a chair using your arms (e.g., wheelchair or bedside chair)?: A Little Help needed to walk in hospital room?: A Little Help needed climbing 3-5 steps with a railing? : A Little 6 Click Score: 18    End of Session Equipment Utilized During Treatment: Gait belt Activity Tolerance: Patient limited by pain Patient left: in chair;with call bell/phone within reach;with chair alarm set;with nursing/sitter in room Nurse Communication: Mobility status PT Visit Diagnosis: Difficulty in walking, not elsewhere classified (R26.2);Pain Pain - Right/Left: Left Pain - part of body: Leg    Time: 2119-4174 PT Time Calculation (min) (ACUTE ONLY): 23 min   Charges:   PT Evaluation $PT Eval Moderate Complexity: 1 Mod PT Treatments $Gait Training: 8-22 mins        Alisha Goodman, [place] Pager  [phone] 06/29/2019   Alisha Goodman 06/29/2019, 10:54 AM

## 2019-06-29 NOTE — Progress Notes (Addendum)
   06/29/19 0159  Assess: MEWS Score  Temp (!) 101.1 F (38.4 C)  BP 133/77  Pulse Rate (!) 120  Resp (!) 22  SpO2 98 %  O2 Device Room Air  Assess: MEWS Score  MEWS Temp 1  MEWS Systolic 0  MEWS Pulse 2  MEWS RR 1  MEWS LOC 0  MEWS Score 4  MEWS Score Color Red  Assess: if the MEWS score is Yellow or Red  Were vital signs taken at a resting state? Yes  Focused Assessment Documented focused assessment  Early Detection of Sepsis Score *See Row Information* High  MEWS guidelines implemented *See Row Information* No, previously red, continue vital signs every 4 hours  Treat  MEWS Interventions Administered scheduled meds/treatments;Escalated (See documentation below)  Take Vital Signs  Increase Vital Sign Frequency  Red: Q 1hr X 4 then Q 4hr X 4, if remains red, continue Q 4hrs  Escalate  MEWS: Escalate Red: discuss with charge nurse/RN and provider, consider discussing with RRT  Notify: Charge Nurse/RN  Name of Charge Nurse/RN Notified Iffy   Date Charge Nurse/RN Notified 06/29/19  Time Charge Nurse/RN Notified 0220

## 2019-06-29 NOTE — Progress Notes (Addendum)
PROGRESS NOTE  Alisha Goodman OEU:[id number] DOB: 08/31/1949 DOA: 06/28/2019 PCP: Jolinda Croak, MD  Brief History   74 year old woman PMH lumbar radiculopathy, smoker, presented to the emergency department with progressive left leg pain for several weeks.  Weight loss reported.  A & P  Multifocal lesions throughout the chest and abdomen suspicious for metastatic disease, primary unclear but possibly right lung primary versus pancreatic mass.  Associated left leg pain.  Right renal mass.  Subcutaneous metastasis right chest wall.  Bulky mediastinal and right hilar adenopathy with narrowing of the right upper lobe bronchus, right bronchus intermedius, upper and lower lobe bronchi. --Left leg pain impairs ability to walk.  Continue PT, home health PT on discharge.  Use walker.   --Pain remains poorly controlled.  Will schedule short acting assess for needs 24-hour, then convert to long-acting pain medication. --Status post biopsy 5/28.  Will be followed in the outpatient setting by Dr. Burr Medico next week, she is in contact with radiation oncologist for consideration of initiating therapy in the near future.  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 --Asymptomatic.  Total bilirubin has doubled today.  Will check CMP in a.m.  If LFTs worsen, will consult GI for consideration of stent placement.  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  Trial of IV fluids, monitor volume status, check BMP in a.m.  New diagnosis severe systolic dysfunction LVEF 20-25% with global hypokinesis and atrial flutter.  EKG showed left bundle branch block of unclear chronicity.  Rhythm also appeared to be atrial flutter. --Start telemetry --Start carvedilol, once blood pressure and heart rate stable on carvedilol, will add lisinopril versus Entresto. --Continue subcu heparin until clear patient will not require any further procedures. --Will seek cardiology opinion 5/30  Aortic  atherosclerosis  Emphysema  Episode of fever last night, does not appear physician was called.  Appears asymptomatic now.  Will monitor.  Disposition Plan:  Discussion: Multiple new issues diagnosis admission with complex coordination of care and treatment plan.  Close attention will need to be paid to LFTs, volume status, hyponatremia and pain.  Status is: Inpatient  Remains inpatient appropriate because:Ongoing diagnostic testing needed not appropriate for outpatient work up  Dispo: The patient is from: Home              Anticipated d/c is to: Home              Anticipated d/c date is: 2 days              Patient currently is not medically stable to d/c.  DVT prophylaxis: SCDs Code Status: Full Family Communication: Discussed in detail with husband by bedside yesterday evening and with son and 2 daughters at bedside today.  Time spent in counseling coordination of care greater and 50%, greater than 35 minutes  [name], MD  [facility] Direct contact: see [email or web address] (further directions at bottom of note if needed) 7PM-7AM contact night coverage as at bottom of note 06/29/2019, 1:31 PM  LOS: 1 day   Significant Hospital Events   . 5/28 admitted for leg pain and further evaluation for new diagnosis of metastatic cancer   Consults:  . Oncology . Interventional radiology . Pulmonology   Procedures:  . 5/28 ultrasound-guided biopsy pathologic left supraclavicular lymph node  Significant Diagnostic Tests:   CT chest abdomen pelvis with diffuse multifocal malignancy, possibly right lung primary, possible pancreatic primary.  Bulky mediastinal and right hilar adenopathy with narrowing of multiple bronchi.  Large  liver lesion with mass-effect in common bile duct. . 2D echocardiogram LVEF 20-25%, global hypokinesis.   Micro Data:  .    Antimicrobials:     Interval History/Subjective  Leg pain still prominent, somewhat better with use of walker and pain  medication.  No shortness of breath.  No heart history.  Objective   Vitals:  Vitals:   06/29/19 0822 06/29/19 1042  BP: (!) 141/80 122/69  Pulse:  (!) 116  Resp:  (!) 24  Temp:  99.2 F (37.3 C)  SpO2:  98%    Exam:  Constitutional.  Appears calm, comfortable sitting in chair. Respiratory.  Clear to auscultation bilaterally.  No wheezes, rales or rhonchi.  Normal respiratory effort. Cardiovascular.  Regular rate and rhythm.  No murmur, rub or gallop.  No lower extremity edema. Left lower extremity.  Moves to command. Left lower extremity skin appears unremarkable.  Thigh appears unremarkable. Psychiatric.  Grossly normal mood and affect.  Speech fluent and appropriate.  I have personally reviewed the following:   Today's Data  . Sodium 126, potassium 3.2, AST and ALT slightly improved today.  Total bilirubin up to 2.1. . CBC without significant change.  Scheduled Meds: . amLODipine  10 mg Oral Daily  . carvedilol  3.125 mg Oral BID WC  . cloNIDine  0.3 mg Oral BID  . gabapentin  100 mg Oral Daily  . heparin  5,000 Units Subcutaneous Q8H  . nicotine  21 mg Transdermal Daily  . polyethylene glycol  17 g Oral BID  . senna  1 tablet Oral QHS  . sodium chloride flush  3 mL Intravenous Q12H   Continuous Infusions: . sodium chloride 75 mL/hr at 06/29/19 9407    Active Problems:   Metastatic cancer ([place])   LOS: 1 day   How to contact the [facility] Attending or Consulting provider [place] or covering provider during after hours [place], for this patient?  1. Check the care team in [facility] and look for a) attending/consulting [facility] provider listed and b) the [facility] team listed 2. Log into [email or web address] and use [place]'s universal password to access. If you do not have the password, please contact the hospital operator. 3. Locate the [facility] provider you are looking for under [facility] and page to a number that you can be directly reached. 4. If you still have difficulty reaching  the provider, please page the [facility] (Director on Call) for the Hospitalists listed on amion for assistance.

## 2019-06-29 NOTE — Progress Notes (Addendum)
   06/28/19 1957  Assess: MEWS Score  Temp 100.3 F (37.9 C)  BP (!) 143/78  Pulse Rate (!) 132  Resp (!) 28  SpO2 97 %  O2 Device Room Air  Assess: MEWS Score  MEWS Temp 0  MEWS Systolic 0  MEWS Pulse 3  MEWS RR 2  MEWS LOC 0  MEWS Score 5  MEWS Score Color Red  Assess: if the MEWS score is Yellow or Red  Were vital signs taken at a resting state? No  Focused Assessment Documented focused assessment  Early Detection of Sepsis Score *See Row Information* Medium  MEWS guidelines implemented *See Row Information* No, previously red, continue vital signs every 4 hours

## 2019-06-30 ENCOUNTER — Encounter (HOSPITAL_COMMUNITY): Payer: Self-pay | Admitting: Internal Medicine

## 2019-06-30 DIAGNOSIS — I4892 Unspecified atrial flutter: Secondary | ICD-10-CM

## 2019-06-30 DIAGNOSIS — I5022 Chronic systolic (congestive) heart failure: Secondary | ICD-10-CM

## 2019-06-30 DIAGNOSIS — E871 Hypo-osmolality and hyponatremia: Secondary | ICD-10-CM

## 2019-06-30 DIAGNOSIS — K5900 Constipation, unspecified: Secondary | ICD-10-CM

## 2019-06-30 DIAGNOSIS — G893 Neoplasm related pain (acute) (chronic): Secondary | ICD-10-CM | POA: Diagnosis present

## 2019-06-30 DIAGNOSIS — I5021 Acute systolic (congestive) heart failure: Secondary | ICD-10-CM

## 2019-06-30 DIAGNOSIS — R7989 Other specified abnormal findings of blood chemistry: Secondary | ICD-10-CM

## 2019-06-30 LAB — COMPREHENSIVE METABOLIC PANEL
ALT: 217 U/L — ABNORMAL HIGH (ref 0–44)
AST: 134 U/L — ABNORMAL HIGH (ref 15–41)
Albumin: 3 g/dL — ABNORMAL LOW (ref 3.5–5.0)
Alkaline Phosphatase: 298 U/L — ABNORMAL HIGH (ref 38–126)
Anion gap: 10 (ref 5–15)
BUN: 9 mg/dL (ref 8–23)
CO2: 21 mmol/L — ABNORMAL LOW (ref 22–32)
Calcium: 8.4 mg/dL — ABNORMAL LOW (ref 8.9–10.3)
Chloride: 104 mmol/L (ref 98–111)
Creatinine, Ser: 0.53 mg/dL (ref 0.44–1.00)
GFR calc Af Amer: >60 mL/min (ref >60)
GFR calc non Af Amer: >60 mL/min (ref >60)
Glucose, Bld: 88 mg/dL (ref 70–99)
Potassium: 3.4 mmol/L — ABNORMAL LOW (ref 3.5–5.1)
Sodium: 135 mmol/L (ref 135–145)
Total Bilirubin: 1.7 mg/dL — ABNORMAL HIGH (ref 0.3–1.2)
Total Protein: 7.1 g/dL (ref 6.5–8.1)

## 2019-06-30 LAB — CBC
HCT: 29.7 % — ABNORMAL LOW (ref 36.0–46.0)
Hemoglobin: 9.5 g/dL — ABNORMAL LOW (ref 12.0–15.0)
MCH: 27.2 pg (ref 26.0–34.0)
MCHC: 32 g/dL (ref 30.0–36.0)
MCV: 85.1 fL (ref 80.0–100.0)
Platelets: 486 10*3/uL — ABNORMAL HIGH (ref 150–400)
RBC: 3.49 MIL/uL — ABNORMAL LOW (ref 3.87–5.11)
RDW: 15 % (ref 11.5–15.5)
WBC: 9.6 10*3/uL (ref 4.0–10.5)
nRBC: 0 % (ref 0.0–0.2)

## 2019-06-30 MED ORDER — OXYCODONE HCL ER 10 MG PO T12A
10.0000 mg | EXTENDED_RELEASE_TABLET | Freq: Two times a day (BID) | ORAL | Status: DC
  Administered 2019-06-30 – 2019-07-02 (×4): 10 mg via ORAL
  Filled 2019-06-30 (×4): qty 1

## 2019-06-30 MED ORDER — OXYCODONE HCL 5 MG PO TABS
5.0000 mg | ORAL_TABLET | Freq: Four times a day (QID) | ORAL | Status: DC | PRN
  Administered 2019-07-01 – 2019-07-02 (×2): 5 mg via ORAL
  Filled 2019-06-30 (×3): qty 1

## 2019-06-30 MED ORDER — POTASSIUM CHLORIDE CRYS ER 20 MEQ PO TBCR
40.0000 meq | EXTENDED_RELEASE_TABLET | Freq: Once | ORAL | Status: AC
  Administered 2019-06-30: 40 meq via ORAL
  Filled 2019-06-30: qty 2

## 2019-06-30 MED ORDER — BISACODYL 10 MG RE SUPP
10.0000 mg | Freq: Once | RECTAL | Status: AC
  Administered 2019-06-30: 10 mg via RECTAL
  Filled 2019-06-30: qty 1

## 2019-06-30 MED ORDER — MAGNESIUM CITRATE PO SOLN
1.0000 | Freq: Once | ORAL | Status: AC
  Administered 2019-06-30: 1 via ORAL
  Filled 2019-06-30: qty 296

## 2019-06-30 MED ORDER — IRBESARTAN 150 MG PO TABS
150.0000 mg | ORAL_TABLET | Freq: Every day | ORAL | Status: DC
  Administered 2019-06-30 – 2019-07-02 (×3): 150 mg via ORAL
  Filled 2019-06-30 (×3): qty 1

## 2019-06-30 NOTE — Consult Note (Addendum)
CONSULTATION NOTE   Patient Name: Alisha Goodman Date of Encounter: 06/30/2019 Cardiologist: No primary care provider on file.  Chief Complaint   Weakness, weight loss  Patient Profile   74 yo female with newly diagnosed metastatic cancer, found to have new systolic CHF with LVEF 18-84%, possible atrial flutter  HPI   Alisha Goodman is a 74 y.o. female who is being seen today for the evaluation of new systolic CHF at the request of Dr. Sarajane Jews. This is a 74 year old female with unfortunate new diagnosis of presumed metastatic cancer.  She presented initially for progressive leg pain, weakness and weight loss.  She was found on CT to have multifocal lesions throughout the chest and abdomen suspicious for metastatic cancer.  Additionally, an echo was performed which showed severe systolic dysfunction and LVEF of 20 to 25%, no clear A waves - rate is elevated and has been persistent. EKG's personally reviewed and suggest possible atrial flutter- I have also reviewed telemetry which shows sinus rhythm yesterday, a short run of NSVT and some persistent tachycardia which may be flutter vs sinus tach.  PMHx   Past Medical History:  Diagnosis Date  . Aortic atherosclerosis ([place]) 06/29/2019  . Atrial flutter ([place]) 06/29/2019  . Chronic systolic CHF (congestive heart failure) ([place]) 06/29/2019  . Emphysema of lung ([place]) 06/29/2019  . Hyperlipidemia   . Hypertension   . Lumbar radiculopathy   . Metastatic cancer ([place]) 06/28/2019    Past Surgical History:  Procedure Laterality Date  . ABDOMINAL HYSTERECTOMY      FAMHx   Family History  Problem Relation Age of Onset  . Breast cancer Neg Hx     SOCHx    reports that she has quit smoking. Her smoking use included cigarettes. She has never used smokeless tobacco. She reports that she does not drink alcohol or use drugs.  Outpatient Medications   No current facility-administered medications on file prior to encounter.   Current  Outpatient Medications on File Prior to Encounter  Medication Sig Dispense Refill  . amLODipine (NORVASC) 10 MG tablet Take 10 mg by mouth daily.    . cloNIDine (CATAPRES) 0.3 MG tablet Take 0.3 mg by mouth 2 (two) times daily.     Mariane Baumgarten Sodium (DSS) 100 MG CAPS Take 100 mg by mouth in the morning and at bedtime.     . gabapentin (NEURONTIN) 100 MG capsule Take 100 mg by mouth daily.    Marland Kitchen lisinopril-hydrochlorothiazide (ZESTORETIC) 20-25 MG tablet Take 1 tablet by mouth daily.    . simvastatin (ZOCOR) 10 MG tablet Take 10 mg by mouth at bedtime.      Inpatient Medications    Scheduled Meds: . amLODipine  10 mg Oral Daily  . cloNIDine  0.3 mg Oral BID  . feeding supplement (ENSURE ENLIVE)  237 mL Oral BID BM  . gabapentin  100 mg Oral Daily  . heparin  5,000 Units Subcutaneous Q8H  . metoprolol tartrate  25 mg Oral BID  . multivitamin with minerals  1 tablet Oral Daily  . nicotine  21 mg Transdermal Daily  . oxyCODONE  5 mg Oral Q6H  . polyethylene glycol  17 g Oral BID  . senna  1 tablet Oral QHS  . sodium chloride flush  3 mL Intravenous Q12H    Continuous Infusions:   PRN Meds: acetaminophen **OR** acetaminophen, albuterol, HYDROmorphone (DILAUDID) injection, ondansetron **OR** ondansetron (ZOFRAN) IV   ALLERGIES   Allergies  Allergen Reactions  .  Sulfa Antibiotics     Rash    . Aspirin Nausea Only    ROS   Pertinent items noted in HPI and remainder of comprehensive ROS otherwise negative.  Vitals   Vitals:   06/29/19 1948 06/29/19 2233 06/30/19 0211 06/30/19 0603  BP:  132/74 120/72 126/76  Pulse:  (!) 109 92 (!) 107  Resp:  (!) 22 (!) 23 20  Temp: 98.5 F (36.9 C) 98.8 F (37.1 C) 98.8 F (37.1 C) 99.2 F (37.3 C)  TempSrc: Axillary Oral Oral Oral  SpO2:  95% 95% 100%  Weight:    54.8 kg  Height:        Intake/Output Summary (Last 24 hours) at 06/30/2019 6294 Last data filed at 06/29/2019 7654 Gross per 24 hour  Intake 1171.25 ml  Output --   Net 1171.25 ml   Filed Weights   06/28/19 0032 06/30/19 0603  Weight: 58.5 kg 54.8 kg    Physical Exam   General appearance: alert, cachectic and no distress Neck: no carotid bruit, no JVD and thyroid not enlarged, symmetric, no tenderness/mass/nodules Lungs: clear to auscultation bilaterally Heart: regular tachycardia Abdomen: soft, non-tender; bowel sounds normal; no masses,  no organomegaly Extremities: extremities normal, atraumatic, no cyanosis or edema Pulses: 2+ and symmetric Skin: Skin color, texture, turgor normal. No rashes or lesions Neurologic: Grossly normal Psych: Pleasant  Labs   Results for orders placed or performed during the hospital encounter of 06/28/19 (from the past 48 hour(s))  Comprehensive metabolic panel     Status: Abnormal   Collection Time: 06/29/19  5:09 AM  Result Value Ref Range   Sodium 126 (L) 135 - 145 mmol/L   Potassium 3.2 (L) 3.5 - 5.1 mmol/L   Chloride 97 (L) 98 - 111 mmol/L   CO2 19 (L) 22 - 32 mmol/L   Glucose, Bld 91 70 - 99 mg/dL    Comment: Glucose reference range applies only to samples taken after fasting for at least 8 hours.   BUN 13 8 - 23 mg/dL   Creatinine, Ser 0.76 0.44 - 1.00 mg/dL   Calcium 8.1 (L) 8.9 - 10.3 mg/dL   Total Protein 7.1 6.5 - 8.1 g/dL   Albumin 2.9 (L) 3.5 - 5.0 g/dL   AST 213 (H) 15 - 41 U/L   ALT 294 (H) 0 - 44 U/L   Alkaline Phosphatase 313 (H) 38 - 126 U/L   Total Bilirubin 2.1 (H) 0.3 - 1.2 mg/dL   GFR calc non Af Amer >60 >60 mL/min   GFR calc Af Amer >60 >60 mL/min   Anion gap 10 5 - 15    Comment: Performed at [facility], [place] [address] [place], [place] [address]  CBC     Status: Abnormal   Collection Time: 06/29/19  5:09 AM  Result Value Ref Range   WBC 11.3 (H) 4.0 - 10.5 K/uL   RBC 3.74 (L) 3.87 - 5.11 MIL/uL   Hemoglobin 10.2 (L) 12.0 - 15.0 g/dL   HCT 31.4 (L) 36.0 - 46.0 %   MCV 84.0 80.0 - 100.0 fL   MCH 27.3 26.0 - 34.0 pg   MCHC 32.5 30.0 - 36.0 g/dL    RDW 14.9 11.5 - 15.5 %   Platelets 473 (H) 150 - 400 K/uL   nRBC 0.0 0.0 - 0.2 %    Comment: Performed at [facility], [place] [address], [place], [place] [address]  Comprehensive metabolic panel  Status: Abnormal   Collection Time: 06/30/19  5:11 AM  Result Value Ref Range   Sodium 135 135 - 145 mmol/L    Comment: DELTA CHECK NOTED   Potassium 3.4 (L) 3.5 - 5.1 mmol/L   Chloride 104 98 - 111 mmol/L   CO2 21 (L) 22 - 32 mmol/L   Glucose, Bld 88 70 - 99 mg/dL    Comment: Glucose reference range applies only to samples taken after fasting for at least 8 hours.   BUN 9 8 - 23 mg/dL   Creatinine, Ser 0.53 0.44 - 1.00 mg/dL   Calcium 8.4 (L) 8.9 - 10.3 mg/dL   Total Protein 7.1 6.5 - 8.1 g/dL   Albumin 3.0 (L) 3.5 - 5.0 g/dL   AST 134 (H) 15 - 41 U/L   ALT 217 (H) 0 - 44 U/L   Alkaline Phosphatase 298 (H) 38 - 126 U/L   Total Bilirubin 1.7 (H) 0.3 - 1.2 mg/dL   GFR calc non Af Amer >60 >60 mL/min   GFR calc Af Amer >60 >60 mL/min   Anion gap 10 5 - 15    Comment: Performed at [facility], [place] [address], [place], [place] [address]  CBC     Status: Abnormal   Collection Time: 06/30/19  5:11 AM  Result Value Ref Range   WBC 9.6 4.0 - 10.5 K/uL   RBC 3.49 (L) 3.87 - 5.11 MIL/uL   Hemoglobin 9.5 (L) 12.0 - 15.0 g/dL   HCT 29.7 (L) 36.0 - 46.0 %   MCV 85.1 80.0 - 100.0 fL   MCH 27.2 26.0 - 34.0 pg   MCHC 32.0 30.0 - 36.0 g/dL   RDW 15.0 11.5 - 15.5 %   Platelets 486 (H) 150 - 400 K/uL   nRBC 0.0 0.0 - 0.2 %    Comment: Performed at [facility], [place] [address], [place],  [address]    ECG   Suspect arial flutter or EAT- Personally Reviewed  Telemetry   Sinus rhythm with tachycardia (possible sinus tach vs flutter vs EAT) - Personally Reviewed  Radiology   ECHOCARDIOGRAM COMPLETE  Result Date: 06/28/2019    ECHOCARDIOGRAM REPORT   Patient Name:   Alisha Goodman Date of Exam: 06/28/2019 Medical Rec #:   [id number]       Height:       65.0 in Accession #:    [id number]      Weight:       129.0 lb Date of Birth:  08/31/1949        BSA:          1.642 m Patient Age:    29 years        BP:           133/81 mmHg Patient Gender: F               HR:           115 bpm. Exam Location:  Inpatient Procedure: 2D Echo, Cardiac Doppler, Color Doppler and Intracardiac            Opacification Agent Indications:    Chest Pain 786.50 / R07.9  History:        Patient has no prior history of Echocardiogram examinations.                 Risk Factors:Former Smoker.  Sonographer:    Vickie Epley RDCS Referring Phys: [id number] [place]  Comments: Suboptimal parasternal window. IMPRESSIONS  1. No apical thrombus by Definity contrast. Left ventricular ejection fraction, by estimation, is 20 to 25%. The left ventricle has severely decreased function. The left ventricle demonstrates global hypokinesis. The left ventricular internal cavity size was mildly dilated. indeterminate diastolic function, due to what appears to be atrial flutter.  2. Right ventricular systolic function is hyperdynamic. The right ventricular size is normal.  3. The mitral valve is grossly normal. No evidence of mitral valve regurgitation.  4. The aortic valve was not well visualized. Aortic valve regurgitation is not visualized. Mild aortic valve sclerosis is present, with no evidence of aortic valve stenosis.  5. The inferior vena cava is normal in size with greater than 50% respiratory variability, suggesting right atrial pressure of 3 mmHg. Conclusion(s)/Recommendation(s): Rhythm appears to be atrial flutter, I have also reviewed the EKG which demonstrates flutter waves. FINDINGS  Left Ventricle: No apical thrombus by Definity contrast. Left ventricular ejection fraction, by estimation, is 20 to 25%. The left ventricle has severely decreased function. The left ventricle demonstrates global hypokinesis. Definity contrast agent was  given IV to  delineate the left ventricular endocardial borders. The left ventricular internal cavity size was mildly dilated. There is no left ventricular hypertrophy. Left ventricular diastolic function could not be evaluated due to possible atrial flutter. Indeterminate diastolic function, due to what appears to be atrial flutter. Right Ventricle: The right ventricular size is normal. No increase in right ventricular wall thickness. Right ventricular systolic function is hyperdynamic. Left Atrium: Left atrial size was normal in size. Right Atrium: Right atrial size was normal in size. Pericardium: There is no evidence of pericardial effusion. Mitral Valve: The mitral valve is grossly normal. No evidence of mitral valve regurgitation. Tricuspid Valve: The tricuspid valve is grossly normal. Tricuspid valve regurgitation is not demonstrated. Aortic Valve: The aortic valve was not well visualized. Aortic valve regurgitation is not visualized. Mild aortic valve sclerosis is present, with no evidence of aortic valve stenosis. Pulmonic Valve: The pulmonic valve was normal in structure. Pulmonic valve regurgitation is not visualized. Aorta: The aortic root and ascending aorta are structurally normal, with no evidence of dilitation. Venous: The inferior vena cava is normal in size with greater than 50% respiratory variability, suggesting right atrial pressure of 3 mmHg. IAS/Shunts: No atrial level shunt detected by color flow Doppler.  LEFT VENTRICLE PLAX 2D LVIDd:         4.04 cm LVIDs:         3.62 cm LV PW:         0.76 cm LV IVS:        0.77 cm LVOT diam:     1.90 cm LV SV:         48 LV SV Index:   29 LVOT Area:     2.84 cm  LV Volumes (MOD) LV vol d, MOD A2C: 71.0 ml LV vol d, MOD A4C: 68.8 ml LV vol s, MOD A2C: 49.9 ml LV vol s, MOD A4C: 60.4 ml LV SV MOD A2C:     21.1 ml LV SV MOD A4C:     68.8 ml LV SV MOD BP:      14.9 ml RIGHT VENTRICLE TAPSE (M-mode): 2.2 cm LEFT ATRIUM             Index       RIGHT ATRIUM          Index  LA diam:        2.50  cm 1.52 cm/m  RA Area:     8.27 cm LA Vol (A2C):   31.1 ml 18.94 ml/m RA Volume:   15.30 ml 9.32 ml/m LA Vol (A4C):   18.0 ml 10.96 ml/m LA Biplane Vol: 23.7 ml 14.44 ml/m  AORTIC VALVE LVOT Vmax:   113.00 cm/s LVOT Vmean:  73.900 cm/s LVOT VTI:    0.169 m  AORTA Ao Root diam: 2.90 cm  SHUNTS Systemic VTI:  0.17 m Systemic Diam: 1.90 cm Lyman Bishop MD Electronically signed by Lyman Bishop MD Signature Date/Time: 06/28/2019/3:34:15 PM    Final    [place] CORE BIOPSY (LYMPH NODES)  Result Date: 06/28/2019 INDICATION: 74 year old female with a 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 ANESTHESIA/SEDATION: None FLUOROSCOPY TIME:  None COMPLICATIONS: None PROCEDURE: Informed written consent was obtained from the patient after a thorough discussion of the procedural risks, benefits and alternatives. All questions were addressed. Maximal Sterile Barrier Technique was utilized including caps, mask, sterile gowns, sterile gloves, sterile drape, hand hygiene and skin antiseptic. A timeout was performed prior to the initiation of the procedure. Ultrasound survey was performed with images stored and sent to PACs. The left neck was prepped with chlorhexidine in a sterile fashion, and a sterile drape was applied covering the operative field. A sterile gown and sterile gloves were used for the procedure. Local anesthesia was provided with 1% Lidocaine. Ultrasound guidance was used to infiltrate the region with 1% lidocaine for local anesthesia. A small stab incision was made with 11 blade scalpel. Using ultrasound guidance, multiple 18 gauge core biopsy were acquired of the pathologic left supraclavicular node. These were placed in day fresh specimen container. Final image was stored after biopsy. Patient tolerated the procedure well and remained hemodynamically stable throughout. No  complications were encountered and no significant blood loss was encounter IMPRESSION: Status post ultrasound-guided biopsy of left supraclavicular pathologic node. Signed, Dulcy Fanny. Dellia Nims, RPVI [facility] Radiology Electronically Signed   By: Corrie Mckusick D.O.   On: 06/28/2019 17:11    Cardiac Studies   See echo  Impression   Principal Problem:   Metastatic cancer [facility]) Active Problems:   Acute systolic (congestive) heart failure (HCC)   Atrial flutter (HCC)   Hyponatremia   Elevated LFTs   Aortic atherosclerosis (HCC)   Emphysema of lung ([place])   Recommendation   1. Acute systolic heart failure - new diagnosis.  She has been followed by primary care provider.  She was surprised to learn of this diagnosis of probable metastatic cancer.  Additionally, there is new reduction in LVEF to 20 to 25%.  This appears to be global hypokinesis and I suspect is nonischemic.  On admission she was not tachycardic rather in a normal sinus rhythm therefore it is unlikely to be tachycardia mediated.   -Agree with metoprolol for rate control and treatment of cardiomyopathy  -Would stop Norvasc and add irbesartan, possibly switch to Entresto later  - Consider ischemia evaluation with stress testing, however, suspect this is   NICM and she is not likely a candidate for revascularization 2. Possible atrial flutter vs EAT / NSVT- rhythm appeared initially to be flutter, however, the morphology of the tachycardia very similar to sinus rhythm - could be sinus tach, although not clear why HR just increased yesterday - also noted to have NSVT. Suspect this is an ectopic atrial rhythm. -On metoprolol 25 mg BID -  may increase further for rate control  -Consider escalating anticoagulation from prophylaxis to treatment if flutter is confirmed.  3.   Metastatic cancer - unknown primary, work-up is underway per oncology -       suspect lung or pancreas. 4.    Essential hypertension - stop amlodipine, change to irbesartan - metoprolol,       continue clonidine.   Thanks for the consult. Cardiology will follow with you.  Time Spent Directly with Patient:  I have spent a total of 45 minutes with the patient reviewing hospital notes, telemetry, EKGs, labs and examining the patient as well as establishing an assessment and plan that was discussed personally with the patient.  > 50% of time was spent in direct patient care.  Length of Stay:  LOS: 2 days   Pixie Casino, MD, [facility], [place] Director of the Advanced Lipid Disorders &  Cardiovascular Risk Reduction Clinic Diplomate of the American Board of Clinical Lipidology Attending Cardiologist  Direct Dial: [phone]  Fax: [phone]  Website:  [email or web address] Amiri Tritch 06/30/2019, 9:58 AM

## 2019-06-30 NOTE — Plan of Care (Signed)
  Problem: Health Behavior/Discharge Planning: Goal: Ability to manage health-related needs will improve Outcome: Progressing   Problem: Clinical Measurements: Goal: Will remain free from infection Outcome: Progressing Goal: Diagnostic test results will improve Outcome: Progressing Goal: Respiratory complications will improve Outcome: Progressing Goal: Cardiovascular complication will be avoided Outcome: Progressing   Problem: Nutrition: Goal: Adequate nutrition will be maintained Outcome: Progressing   Problem: Coping: Goal: Level of anxiety will decrease Outcome: Progressing   Problem: Elimination: Goal: Will not experience complications related to bowel motility Outcome: Progressing Goal: Will not experience complications related to urinary retention Outcome: Progressing   Problem: Pain Managment: Goal: General experience of comfort will improve Outcome: Progressing   Problem: Safety: Goal: Ability to remain free from injury will improve Outcome: Progressing   Problem: Skin Integrity: Goal: Risk for impaired skin integrity will decrease Outcome: Progressing   Problem: Education: Goal: Ability to verbalize understanding of medication therapies will improve Outcome: Progressing   Problem: Activity: Goal: Capacity to carry out activities will improve Outcome: Progressing   Problem: Cardiac: Goal: Ability to achieve and maintain adequate cardiopulmonary perfusion will improve Outcome: Progressing

## 2019-06-30 NOTE — Progress Notes (Addendum)
PROGRESS NOTE  Alisha Goodman HKV:[id number] DOB: 08/31/1949 DOA: 06/28/2019 PCP: Jolinda Croak, MD  Brief History   74 year old woman PMH lumbar radiculopathy, smoker, presented to the emergency department with progressive left leg pain for several weeks.  Weight loss reported.  Found to have widely metastatic disease of unknown primary.  Admitted for treatment of leg pain and further evaluation.  Seen by interventional radiology and underwent left supraclavicular node biopsy 5/28.  Seen by oncology with recommendation for close outpatient follow-up 6/2, Dr. Burr Medico in communication with radiation oncology and will coordinate care for consideration of radiation therapy.  Also seen by pulmonology for metastatic disease.  Pain control improved.  Other issues include elevated LFTs secondary to metastatic disease, currently monitoring for compression resulting in obstruction.  New diagnosis systolic CHF, being followed by cardiology.  Likely discharge home in the next few days once pain controlled and other issues stable.  A & P  Multifocal lesions throughout the chest and abdomen suspicious for metastatic disease, primary unclear but possibly right lung primary versus pancreatic mass.  Associated with left leg pain probably secondary to left paraspinal mass.  Right renal mass.  Subcutaneous metastasis right chest wall.  Bulky mediastinal and right hilar adenopathy with narrowing of the right upper lobe bronchus, right bronchus intermedius, upper and lower lobe bronchi. --Still has left leg pain but better today and able to ambulate better.  Continue PT, home health PT on discharge.  Use walker.   --We will convert to long-acting pain medication this evening, keep oral medication for breakthrough.  Discussed in detail with family, goal is for adequate pain control to enable mobilization. --Status post biopsy 5/28.  Will be followed in the outpatient setting by Dr. Burr Medico -- added contact information to AVS  and sent message to Dr. Burr Medico to ensure follow-up in case pt discharged 5/31, otherwise Santiago Glad NP with oncology will see Tuesday. Dr. Burr Medico is in contact with radiation oncologist for consideration of initiating therapy in the near future. --Respiratory status appears stable.  Follow-up with pulmonology as needed.  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 --Alkaline phosphatase and transaminases decreased.  Total bilirubin doubled 5/29 but is slightly improved today.  Asymptomatic.  Will check CMP in a.m., if total bilirubin were to go up, would consult GI or interventional radiology for consideration of stent placement.  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 --Resolved at this point.  New diagnosis severe systolic dysfunction LVEF 20-25% with global hypokinesis.  EKG showed left bundle branch block of unclear chronicity.  Rhythm also appeared to be atrial flutter. --Continue telemetry.  Had NSVT x8. --Continue metoprolol.  Norvasc stopped.  Irbesartan added by cardiology. --further management/workup per cardiology.  Possible atrial flutter versus EAT/NSVT.   --Suspected to be ectopic atrial rhythm by cardiology.   --Continue metoprolol.  Continue telemetry. --Continue subcu heparin for now but if atrial flutter is confirmed need to consider anticoagulation.  Anemia of chronic disease --Probably stable, a bit lower today, likely reflecting hemodilution.  Constipation present on admission --Will be exacerbated by narcotics.  Discussed in detail with family.  Will need aggressive bowel regimen, thus far has not responded to MiraLAX. --No signs or symptoms to suggest obstruction and recent imaging was negative for obstruction. --Magnesium citrate x1, continue MiraLAX, senna.  Dulcolax suppository x1.  If no response proceed with enema.  Aortic atherosclerosis  Emphysema  Episode of fever last  week and last night, does not  appear physician was called.  --Again appears asymptomatic today.  No fever during the day.  Leukocytosis has resolved.  No signs or symptoms to suggest infection.  Timing suggest atelectasis.  Have added incentive spirometry.  Discussed in detail with family at bedside.  Disposition Plan:  Discussion: Multiple new issues diagnosis admission with complex coordination of care and treatment plan.  Continue close attention to pain, LFTs, volume status and constipation.  Status is: Inpatient  Remains inpatient appropriate because:Ongoing diagnostic testing needed not appropriate for outpatient work up  Dispo: The patient is from: Home              Anticipated d/c is to: Home              Anticipated d/c date is: 1-2 days if lab work stable               Patient currently is not medically stable to d/c.  DVT prophylaxis: SCDs Code Status: Full Family Communication: Discussed in detail with husband, son, daughter at bedside.  Discussed further with husband outside of room when he inquired about prognosis.  [name], MD  [facility] Direct contact: see [email or web address] (further directions at bottom of note if needed) 7PM-7AM contact night coverage as at bottom of note 06/30/2019, 4:09 PM  LOS: 2 days   Significant Hospital Events   . 5/28 admitted for leg pain and further evaluation for new diagnosis of metastatic cancer   Consults:  . Oncology . Interventional radiology . Pulmonology   Procedures:  . 5/28 ultrasound-guided biopsy pathologic left supraclavicular lymph node  Significant Diagnostic Tests:   CT chest abdomen pelvis with diffuse multifocal malignancy, possibly right lung primary, possible pancreatic primary.  Bulky mediastinal and right hilar adenopathy with narrowing of multiple bronchi.  Large liver lesion with mass-effect in common bile duct. . 2D echocardiogram LVEF 20-25%, global hypokinesis.   Micro Data:  .      Antimicrobials:     Interval History/Subjective  Left leg pain much decreased, able to ambulate better, pain medication seems to be helping.  Still no bowel movement other than very small "pebbles" yesterday.  No bowel movement for approximately 1 week.  Tolerating diet with fairly good dinner intake last night and breakfast intake today.  Temperature 101 last night.  Objective   Vitals:  Vitals:   06/30/19 1023 06/30/19 1327  BP: (!) 157/84 124/69  Pulse: (!) 117 93  Resp: 19 20  Temp: 99.5 F (37.5 C) 98.9 F (37.2 C)  SpO2: 98% 99%    Exam:  Constitutional.  Appears calm, comfortable. Respiratory.  Clear to auscultation bilaterally anteriorly.  Faint crackles posteriorly.  No rhonchi or wheezes.  Normal respiratory effort. Cardiovascular.  Tachycardic, regular rhythm.  No murmur, rub or gallop.  No lower extremity edema. Psychiatric.  Grossly normal mood and affect.  Speech fluent and appropriate.  I have personally reviewed the following:   Today's Data  . Sodium up to 135.  Potassium up to 3.4.  Chloride up to 104.  Creatinine within normal limits.  Alkaline phosphatase, AST, ALT slightly decreased.  Total bilirubin slightly decreased, 1.7. . WBC back to 9.6.  Hemoglobin slightly lower at 9.5.  Thrombocytosis noted.  Scheduled Meds: . cloNIDine  0.3 mg Oral BID  . feeding supplement (ENSURE ENLIVE)  237 mL Oral BID BM  . gabapentin  100 mg Oral Daily  . heparin  5,000 Units Subcutaneous Q8H  . irbesartan  150 mg Oral Daily  .  metoprolol tartrate  25 mg Oral BID  . multivitamin with minerals  1 tablet Oral Daily  . nicotine  21 mg Transdermal Daily  . oxyCODONE  10 mg Oral Q12H  . polyethylene glycol  17 g Oral BID  . senna  1 tablet Oral QHS  . sodium chloride flush  3 mL Intravenous Q12H   Continuous Infusions:   Principal Problem:   Metastatic cancer (HCC) Active Problems:   Acute systolic (congestive) heart failure (HCC)   Atrial flutter (HCC)    Hyponatremia   Elevated LFTs   Aortic atherosclerosis (HCC)   Emphysema of lung ([place])   LOS: 2 days   How to contact the [facility] Attending or Consulting provider [place] or covering provider during after hours [place], for this patient?  1. Check the care team in [facility] and look for a) attending/consulting TR[facility] provider listed and b) the [facility] team listed 2. Log into [email or web address] and use [place]'s universal password to access. If you do not have the password, please contact the hospital operator. 3. Locate the [facility] provider you are looking for under [facility] and page to a number that you can be directly reached. 4. If you still have difficulty reaching the provider, please page the [facility] (Director on Call) for the Hospitalists listed on amion for assistance.   Time greater than 35 minutes, greater than 50% counseling and coordination of care

## 2019-06-30 NOTE — Progress Notes (Signed)
Central tele notifed this RN that pt had 8 beat run of v tach and has returned to NSR at 81. Pt asymptomatic, sleeping. On call paged to notify.

## 2019-07-01 DIAGNOSIS — R000 Tachycardia, unspecified: Secondary | ICD-10-CM

## 2019-07-01 DIAGNOSIS — I10 Essential (primary) hypertension: Secondary | ICD-10-CM

## 2019-07-01 DIAGNOSIS — I700 Atherosclerosis of aorta: Secondary | ICD-10-CM

## 2019-07-01 LAB — COMPREHENSIVE METABOLIC PANEL
ALT: 225 U/L — ABNORMAL HIGH (ref 0–44)
AST: 197 U/L — ABNORMAL HIGH (ref 15–41)
Albumin: 2.7 g/dL — ABNORMAL LOW (ref 3.5–5.0)
Alkaline Phosphatase: 337 U/L — ABNORMAL HIGH (ref 38–126)
Anion gap: 9 (ref 5–15)
BUN: 8 mg/dL (ref 8–23)
CO2: 23 mmol/L (ref 22–32)
Calcium: 8.5 mg/dL — ABNORMAL LOW (ref 8.9–10.3)
Chloride: 101 mmol/L (ref 98–111)
Creatinine, Ser: 0.66 mg/dL (ref 0.44–1.00)
GFR calc Af Amer: >60 mL/min (ref >60)
GFR calc non Af Amer: >60 mL/min (ref >60)
Glucose, Bld: 97 mg/dL (ref 70–99)
Potassium: 4.2 mmol/L (ref 3.5–5.1)
Sodium: 133 mmol/L — ABNORMAL LOW (ref 135–145)
Total Bilirubin: 2 mg/dL — ABNORMAL HIGH (ref 0.3–1.2)
Total Protein: 7 g/dL (ref 6.5–8.1)

## 2019-07-01 LAB — PHOSPHORUS: Phosphorus: 1.3 mg/dL — ABNORMAL LOW (ref 2.5–4.6)

## 2019-07-01 LAB — MAGNESIUM: Magnesium: 2.5 mg/dL — ABNORMAL HIGH (ref 1.7–2.4)

## 2019-07-01 MED ORDER — K PHOS MONO-SOD PHOS DI & MONO 155-852-130 MG PO TABS
500.0000 mg | ORAL_TABLET | Freq: Three times a day (TID) | ORAL | Status: DC
  Administered 2019-07-01 – 2019-07-02 (×3): 500 mg via ORAL
  Filled 2019-07-01 (×4): qty 2

## 2019-07-01 MED ORDER — CLONIDINE HCL 0.2 MG PO TABS
0.2000 mg | ORAL_TABLET | Freq: Two times a day (BID) | ORAL | Status: DC
  Administered 2019-07-01 – 2019-07-02 (×3): 0.2 mg via ORAL
  Filled 2019-07-01 (×3): qty 1

## 2019-07-01 MED ORDER — SODIUM CHLORIDE 0.9 % IV SOLN
1.0000 g | INTRAVENOUS | Status: DC
  Administered 2019-07-01: 1 g via INTRAVENOUS
  Filled 2019-07-01: qty 10
  Filled 2019-07-01: qty 1

## 2019-07-01 MED ORDER — SPIRONOLACTONE 12.5 MG HALF TABLET
12.5000 mg | ORAL_TABLET | Freq: Every day | ORAL | Status: DC
  Administered 2019-07-01 – 2019-07-02 (×2): 12.5 mg via ORAL
  Filled 2019-07-01 (×2): qty 1

## 2019-07-01 NOTE — Progress Notes (Signed)
Progress Note  Patient Name: Alisha Goodman Date of Encounter: 07/01/2019  Primary Cardiologist: No primary care provider on file.   Subjective   Denies any chest pain.  Reports dyspnea with minimal exertion.  Inpatient Medications    Scheduled Meds: . cloNIDine  0.3 mg Oral BID  . feeding supplement (ENSURE ENLIVE)  237 mL Oral BID BM  . gabapentin  100 mg Oral Daily  . heparin  5,000 Units Subcutaneous Q8H  . irbesartan  150 mg Oral Daily  . metoprolol tartrate  25 mg Oral BID  . multivitamin with minerals  1 tablet Oral Daily  . nicotine  21 mg Transdermal Daily  . oxyCODONE  10 mg Oral Q12H  . polyethylene glycol  17 g Oral BID  . senna  1 tablet Oral QHS  . sodium chloride flush  3 mL Intravenous Q12H   Continuous Infusions:  PRN Meds: acetaminophen **OR** acetaminophen, albuterol, HYDROmorphone (DILAUDID) injection, ondansetron **OR** ondansetron (ZOFRAN) IV, oxyCODONE   Vital Signs    Vitals:   06/30/19 1327 06/30/19 2105 07/01/19 0500 07/01/19 0544  BP: 124/69 128/76  117/73  Pulse: 93 (!) 107  (!) 108  Resp: 20 20  20   Temp: 98.9 F (37.2 C) 98.8 F (37.1 C)  98.2 F (36.8 C)  TempSrc: Oral Oral  Oral  SpO2: 99% 96%  94%  Weight:   54.2 kg   Height:        Intake/Output Summary (Last 24 hours) at 07/01/2019 0827 Last data filed at 06/30/2019 2107 Gross per 24 hour  Intake 963 ml  Output --  Net 963 ml   Last 3 Weights 07/01/2019 06/30/2019 06/28/2019  Weight (lbs) 119 lb 8 oz 120 lb 14.4 oz 129 lb  Weight (kg) 54.205 kg 54.84 kg 58.514 kg      Telemetry    Sinus rhythm, rate 80-120s - Personally Reviewed  ECG    No new ECG- Personally Reviewed  Physical Exam   GEN: No acute distress.   Neck: + JVD Cardiac: RRR, no murmurs Respiratory: Clear to auscultation bilaterally. GI: Soft, nontender MS: No edema Neuro:  Nonfocal  Psych: Normal affect   Labs    High Sensitivity Troponin:  No results for input(s): TROPONINIHS in the last  720 hours.    Chemistry Recent Labs  Lab 06/29/19 0509 06/30/19 0511 07/01/19 0444  NA 126* 135 133*  K 3.2* 3.4* 4.2  CL 97* 104 101  CO2 19* 21* 23  GLUCOSE 91 88 97  BUN 13 9 8   CREATININE 0.76 0.53 0.66  CALCIUM 8.1* 8.4* 8.5*  PROT 7.1 7.1 7.0  ALBUMIN 2.9* 3.0* 2.7*  AST 213* 134* 197*  ALT 294* 217* 225*  ALKPHOS 313* 298* 337*  BILITOT 2.1* 1.7* 2.0*  GFRNONAA >60 >60 >60  GFRAA >60 >60 >60  ANIONGAP 10 10 9      Hematology Recent Labs  Lab 06/28/19 0533 06/29/19 0509 06/30/19 0511  WBC 10.8* 11.3* 9.6  RBC 4.21 3.74* 3.49*  HGB 11.6* 10.2* 9.5*  HCT 35.0* 31.4* 29.7*  MCV 83.1 84.0 85.1  MCH 27.6 27.3 27.2  MCHC 33.1 32.5 32.0  RDW 14.6 14.9 15.0  PLT 540* 473* 486*    BNP Recent Labs  Lab 06/28/19 0028  BNP 30.2     DDimer No results for input(s): DDIMER in the last 168 hours.   Radiology    No results found.  Cardiac Studies   1. No apical thrombus by Definity  contrast. Left ventricular ejection  fraction, by estimation, is 20 to 25%. The left ventricle has severely  decreased function. The left ventricle demonstrates global hypokinesis.  The left ventricular internal cavity  size was mildly dilated. indeterminate diastolic function, due to what  appears to be atrial flutter.  2. Right ventricular systolic function is hyperdynamic. The right  ventricular size is normal.  3. The mitral valve is grossly normal. No evidence of mitral valve  regurgitation.  4. The aortic valve was not well visualized. Aortic valve regurgitation  is not visualized. Mild aortic valve sclerosis is present, with no  evidence of aortic valve stenosis.  5. The inferior vena cava is normal in size with greater than 50%  respiratory variability, suggesting right atrial pressure of 3 mmHg.   Conclusion(s)/Recommendation(s): Rhythm appears to be atrial flutter, I  have also reviewed the EKG which demonstrates flutter waves.   Patient Profile     74 y.o.  female with newly diagnosed metastatic cancer, found to have new systolic CHF with LVEF 65-68%, possible atrial flutter  Assessment & Plan    Acute systolic heart failure - new diagnosis.  LVEF to 20 to 25%, global hypokinesis.   -Continue metoprolol, will plan to consolidate to toprol XL on discharge  - Continue irbesartan, possibly switch to Entresto later  - Add spironolactone 12.5 mg daily. Would favor weaning off clonidine to have room on BP for titrating GDMT, will decrease clonidine dose to 0.2 mg BID and can discuss weaning plan with pharmacy  -Consider ischemia evaluation with stress testing, however, suspect she is not likely a candidate for revascularization  Possible atrial flutter vs atrial tachycardia / NSVT- currently in sinus rhythm but had tachycardia yesterday- rhythm appeared initially to be flutter vs atrial tachycardia, the morphology of the tachycardia very similar to sinus rhythm - could be sinus tach, although not clear why HR increased yesterday - also noted to have NSVT -Continue metoprolol 25 mg BID  -Consider escalating anticoagulation from prophylaxis to treatment if flutter is confirmed.  -Will likely plan cardiac monitor on discharge if diagnosis remains unclear  Metastatic cancer - unknown primary, work-up is underway per oncology -suspect lung or pancreas.  Essential hypertension - changed from amlodipine to irbesartan.  Also on metoprolol and clonidine. - Add spironolactone 12.5 mg daily.  Would favor weaning clonidine to have room on BP for adding GDMT as above   For questions or updates, please contact [place] Please consult [email or web address] for contact info under        Signed, Donato Heinz, MD  07/01/2019, 8:27 AM

## 2019-07-01 NOTE — Progress Notes (Signed)
Agree with previous RN's assessment of patient. Will continue to monitor

## 2019-07-01 NOTE — Progress Notes (Signed)
Physical Therapy Treatment Patient Details Name: Alisha Goodman MRN: [id number] DOB: 08/31/1949 Today's Date: 07/01/2019    History of Present Illness Alisha Goodman is a 74 year old female with past medical history of hypertension, heavy smoker, presented with progressive of left back and leg pain, CT scan showed bulky mediastinal and right hilar adenopathy, multiple right-sided pulmonary nodules, large liver lesion, and a pancreatic mass.    PT Comments    Pt in bed with 3 children and spouse at bedside visiting.  Assisted OOB to bathroom then amb in hallway.   General transfer comment: cues for safe hand placement but self able also on/off toilet.  Able to perform own peri care.  General Gait Details: amb with and without a walker.  Without walker (as prior to admit) pt present with decreased ability to tolerate full WBing L LE, present with gait instability and short steps.  With walker pt present with increased alternating gait and increased stability.  Avg HR was 113 with 2/3 dyspnea.  "I have to walk slow".  "I get tired easily".  Pt would benefit from a Rollator walker with seat for needed rest breaks.  Shared with pt and family.  Pt worried about the cost.  "I'm on a fixed income".  Family said "don't worry about it we will take care of it".  Pt will NOT need to use in her home but would be beneficial when out.   Follow Up Recommendations  Home health PT;Supervision for mobility/OOB     Equipment Recommendations  3in1 (PT);Other (comment)(4 [employer] Rollator walker)    Recommendations for Other Services       Precautions / Restrictions Precautions Precautions: Fall Restrictions Weight Bearing Restrictions: No    Mobility  Bed Mobility Overal bed mobility: Modified Independent             General bed mobility comments: increased time  Transfers Overall transfer level: Needs assistance Equipment used: None;Rolling walker (2 wheeled) Transfers: Sit to/from [employer] Sit to Stand: Supervision Stand pivot transfers: Supervision;Min guard       General transfer comment: cues for safe hand placement but self able also on/off toilet.  Able to perform own peri care.  Ambulation/Gait Ambulation/Gait assistance: Min guard;Min assist Gait Distance (Feet): 42 Feet Assistive device: Rolling walker (2 wheeled);None Gait Pattern/deviations: Decreased stance time - left;Step-to pattern;Trunk flexed Gait velocity: decreased   General Gait Details: amb with and without a walker.  Without walker (as prior to admit) pt present with decreased ability to tolerate full WBing L LE, present with gait instability and short steps.  With walker pt present with increased alternating gait and increased stability.  Avg HR was 113 with 2/3 dyspnea.  "I have to walk slow".  "I get tired easily".  Pt would benefit from a Rollator walker with seat for needed rest breaks.  Shared with pt and family.  Pt worried about the cost.  "I'm on a fixed income".  Family said "don't worry about it we will take care of it".  Pt will NOT need to use in her home but would be beneficial when out.   Stairs             Wheelchair Mobility    Modified Rankin (Stroke Patients Only)       Balance  Cognition Arousal/Alertness: Awake/alert Behavior During Therapy: WFL for tasks assessed/performed Overall Cognitive Status: Within Functional Limits for tasks assessed                                 General Comments: AxO x 3 pleasant      Exercises      General Comments        Pertinent Vitals/Pain Pain Assessment: Faces Faces Pain Scale: Hurts a little bit Pain Location: L thigh Pain Descriptors / Indicators: Grimacing;Guarding Pain Intervention(s): Monitored during session;Repositioned    Home Living                      Prior Function            PT Goals (current goals can  now be found in the care plan section) Progress towards PT goals: Progressing toward goals    Frequency    Min 3X/week      PT Plan      Co-evaluation              AM-PAC PT "6 Clicks" Mobility   Outcome Measure  Help needed turning from your back to your side while in a flat bed without using bedrails?: A Little Help needed moving from lying on your back to sitting on the side of a flat bed without using bedrails?: A Little Help needed moving to and from a bed to a chair (including a wheelchair)?: A Little Help needed standing up from a chair using your arms (e.g., wheelchair or bedside chair)?: A Little Help needed to walk in hospital room?: A Little Help needed climbing 3-5 steps with a railing? : A Little 6 Click Score: 18    End of Session Equipment Utilized During Treatment: Gait belt Activity Tolerance: Patient tolerated treatment well Patient left: in chair;with call bell/phone within reach;with family/visitor present Nurse Communication: Mobility status(pt would benefit from a Rollator walker vs standard RW when D/C back home) PT Visit Diagnosis: Difficulty in walking, not elsewhere classified (R26.2);Pain Pain - Right/Left: Left Pain - part of body: Leg     Time: 1441-1456 PT Time Calculation (min) (ACUTE ONLY): 15 min  Charges:  $Gait Training: 8-22 mins                     Rica Koyanagi  PTA Acute  Rehabilitation Services Pager      [phone] Office      [phone]

## 2019-07-01 NOTE — Plan of Care (Signed)
  Problem: Health Behavior/Discharge Planning: Goal: Ability to manage health-related needs will improve Outcome: Progressing   Problem: Clinical Measurements: Goal: Ability to maintain clinical measurements within normal limits will improve Outcome: Progressing Goal: Will remain free from infection Outcome: Progressing Goal: Diagnostic test results will improve Outcome: Progressing Goal: Respiratory complications will improve Outcome: Progressing Goal: Cardiovascular complication will be avoided Outcome: Progressing   Problem: Activity: Goal: Risk for activity intolerance will decrease Outcome: Progressing   Problem: Nutrition: Goal: Adequate nutrition will be maintained Outcome: Progressing   Problem: Coping: Goal: Level of anxiety will decrease Outcome: Progressing   Problem: Elimination: Goal: Will not experience complications related to bowel motility Outcome: Progressing Goal: Will not experience complications related to urinary retention Outcome: Progressing   Problem: Pain Managment: Goal: General experience of comfort will improve Outcome: Progressing   Problem: Safety: Goal: Ability to remain free from injury will improve Outcome: Progressing   Problem: Skin Integrity: Goal: Risk for impaired skin integrity will decrease Outcome: Progressing   Problem: Education: Goal: Ability to demonstrate management of disease process will improve Outcome: Progressing Goal: Ability to verbalize understanding of medication therapies will improve Outcome: Progressing   Problem: Activity: Goal: Capacity to carry out activities will improve Outcome: Progressing   Problem: Cardiac: Goal: Ability to achieve and maintain adequate cardiopulmonary perfusion will improve Outcome: Progressing

## 2019-07-01 NOTE — Progress Notes (Signed)
PROGRESS NOTE  RIKI BERNINGER WJX:[id number] DOB: 08/31/1949 DOA: 06/28/2019 PCP: Jolinda Croak, MD   LOS: 3 days   Brief narrative: As per HPI and previous provider,  Patient is a 74 year old woman PMH lumbar radiculopathy, smoker, presented to the emergency department with progressive left leg pain for several weeks with weight loss.  She was then found to have widely metastatic disease of unknown primary.  She was admitted for treatment of leg pain and further evaluation.  Seen by interventional radiology and underwent left supraclavicular node biopsy 5/28.  Seen by oncology with recommendation for close outpatient follow-up 6/2, Dr. Burr Medico in communication with radiation oncology and will coordinate care for consideration of radiation therapy.  Also seen by pulmonology for metastatic disease.  Pain control improved.  Other issues include elevated LFTs secondary to metastatic disease, currently monitoring for compression resulting in obstruction.  New diagnosis systolic CHF, being followed by cardiology.  Likely discharge home in the next few days once pain controlled and other issues stable.  Assessment/Plan:  Principal Problem:   Metastatic cancer ([employer]) Active Problems:   Acute systolic (congestive) heart failure (HCC)   Atrial flutter (HCC)   Hyponatremia   Elevated LFTs   Aortic atherosclerosis (HCC)   Emphysema of lung (HCC)   Pain of metastatic malignancy   Chronic systolic CHF (congestive heart failure) ([place])   Metastatic cancer with multifocal lesions throughout the chest and abdomen , primary unclear but possibly right lung primary versus pancreatic mass.  Patient complains of left leg pain likely secondary to left paraspinal mass.  Patient also has right renal mass, subcutaneous mass in the right chest wall, mediastinal and hilar lymphadenopathy with narrowing of the right lung bronchus.  Patient will need home PT on discharge.  Patient has been changed to long-acting  narcotics with better pain control.  Underwent biopsy on 5/28.  Will be followed by oncology Dr. Burr Medico as outpatient will likely need radiation treatment as outpatient.  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 -AST 197 ALT 225 and alkaline phosphatase 337.  Bilirubin total at 2.0.  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  Sodium of 133 today.    Hypophosphatemia.  Phosphorus level of 1.3.  We will put the patient on Neutra-Phos orally.check levels in a.m.  Acute systolic heart failure with severe systolic dysfunction LVEF 20-25% with global hypokinesis.  EKG showed left bundle branch block of unclear chronicity.  On metoprolol, irbesartan.  Spironolactone has been added by cardiology today.  Clonidine dose has been decreased.  Plan ischemic evaluation with stress testing as per cardiology.   Possible atrial flutter versus NSVT Cardiology on board. On metoprolol.  Anemia of chronic disease No evidence of bleeding.  Check CBC closely  Constipation present on admission. improved after Magnesium citrate x1, continue MiraLAX, senna.  Dulcolax suppository x1.  Continue laxatives for now.  Emphysema.  No acute pulmonary issues.  Episode of fever.  Urine culture with more than 100,000 E. coli colonies.  We will put the patient on IV Rocephin for possible UTI.Marland Kitchen  T-max of 98.8 F.  Continue incentive spirometry.  Discussed in detail with family at bedside.  VTE Prophylaxis: Heparin subcu   Code Status: Full code  Family Communication: Multiple family members at bedside during husband and daughters.  Status is: Inpatient  Remains inpatient appropriate because:Inpatient level of care appropriate due to severity of illness, to light imbalance, cardiology follow-up   Dispo: The patient is  from: Home              Anticipated d/c is to: Home              Anticipated d/c date is: 1 day if  pain is better controlled, cardiology recommendation             Patient currently is not medically stable to d/c.    Consultants:  Oncology  Interventional radiology  Pulmonology  Procedures:  None  Antibiotics:   5/28 ultrasound-guided biopsy pathologic left supraclavicular lymph node  Anti-infectives (From admission, onward)   Start     Dose/Rate Route Frequency Ordered Stop   06/28/19 0545  cefTRIAXone (ROCEPHIN) 1 g in sodium chloride 0.9 % 100 mL IVPB  Status:  Discontinued     1 g 200 mL/hr over 30 Minutes Intravenous Every 24 hours 06/28/19 0539 06/28/19 1224     Subjective: Today, patient was seen and examined at bedside.  Patient complains of left leg pain and does not feel at baseline for discharge.  Denies chest pain, shortness of breath or fever.   Objective: Vitals:   07/01/19 0544 07/01/19 1012  BP: 117/73 120/80  Pulse: (!) 108 (!) 111  Resp: 20 19  Temp: 98.2 F (36.8 C) 98.5 F (36.9 C)  SpO2: 94% 96%    Intake/Output Summary (Last 24 hours) at 07/01/2019 1401 Last data filed at 06/30/2019 2107 Gross per 24 hour  Intake 3 ml  Output --  Net 3 ml   Filed Weights   06/28/19 0032 06/30/19 0603 07/01/19 0500  Weight: 58.5 kg 54.8 kg 54.2 kg   Body mass index is 19.89 kg/m.   Physical Exam: GENERAL: Patient is alert awake and oriented. Not in obvious distress. HENT: No scleral pallor or icterus. Pupils equally reactive to light. Oral mucosa is moist NECK: is supple, no gross swelling noted. CHEST:   Diminished breath sounds bilaterally. CVS: S1 and S2 heard, no murmur. Regular rate and rhythm.  ABDOMEN: Soft, non-tender, bowel sounds are present. EXTREMITIES: No edema. CNS: Cranial nerves are intact. No focal motor deficits. SKIN: warm and dry without rashes.  Data Review: I have personally reviewed the following laboratory data and studies,  CBC: Recent Labs  Lab 06/28/19 0028 06/28/19 0533 06/29/19 0509 06/30/19 0511  WBC 12.3*  10.8* 11.3* 9.6  NEUTROABS 9.5*  --   --   --   HGB 11.9* 11.6* 10.2* 9.5*  HCT 36.7 35.0* 31.4* 29.7*  MCV 83.2 83.1 84.0 85.1  PLT 617* 540* 473* 696*   Basic Metabolic Panel: Recent Labs  Lab 06/28/19 0028 06/28/19 0533 06/29/19 0509 06/30/19 0511 07/01/19 0444  NA 132*  --  126* 135 133*  K 3.7  --  3.2* 3.4* 4.2  CL 96*  --  97* 104 101  CO2 21*  --  19* 21* 23  GLUCOSE 105*  --  91 88 97  BUN 20  --  13 9 8   CREATININE 1.11* 0.92 0.76 0.53 0.66  CALCIUM 9.5  --  8.1* 8.4* 8.5*  MG  --   --   --   --  2.5*  PHOS  --   --   --   --  1.3*   Liver Function Tests: Recent Labs  Lab 06/28/19 0028 06/29/19 0509 06/30/19 0511 07/01/19 0444  AST 264* 213* 134* 197*  ALT 375* 294* 217* 225*  ALKPHOS 351* 313* 298* 337*  BILITOT 1.0 2.1* 1.7* 2.0*  PROT 8.9*  7.1 7.1 7.0  ALBUMIN 4.0 2.9* 3.0* 2.7*   Recent Labs  Lab 06/28/19 0028  LIPASE 325*   No results for input(s): AMMONIA in the last 168 hours. Cardiac Enzymes: No results for input(s): CKTOTAL, CKMB, CKMBINDEX, TROPONINI in the last 168 hours. BNP (last 3 results) Recent Labs    06/28/19 0028  BNP 30.2    ProBNP (last 3 results) No results for input(s): PROBNP in the last 8760 hours.  CBG: No results for input(s): GLUCAP in the last 168 hours. Recent Results (from the past 240 hour(s))  SARS Coronavirus 2 by RT PCR (hospital order, performed in [facility] hospital lab) Nasopharyngeal Nasopharyngeal Swab     Status: None   Collection Time: 06/28/19  2:00 AM   Specimen: Nasopharyngeal Swab  Result Value Ref Range Status   SARS Coronavirus 2 NEGATIVE NEGATIVE Final    Comment: (NOTE) SARS-CoV-2 target nucleic acids are NOT DETECTED. The SARS-CoV-2 RNA is generally detectable in upper and lower respiratory specimens during the acute phase of infection. The lowest concentration of SARS-CoV-2 viral copies this assay can detect is 250 copies / mL. A negative result does not preclude SARS-CoV-2  infection and should not be used as the sole basis for treatment or other patient management decisions.  A negative result may occur with improper specimen collection / handling, submission of specimen other than nasopharyngeal swab, presence of viral mutation(s) within the areas targeted by this assay, and inadequate number of viral copies (<250 copies / mL). A negative result must be combined with clinical observations, patient history, and epidemiological information. Fact Sheet for Patients:   StrictlyIdeas.no Fact Sheet for Healthcare Providers: BankingDealers.co.za This test is not yet approved or cleared  by the [place] FDA and has been authorized for detection and/or diagnosis of SARS-CoV-2 by FDA under an Emergency Use Authorization (EUA).  This EUA will remain in effect (meaning this test can be used) for the duration of the COVID-19 declaration under Section 564(b)(1) of the Act, 21 U.S.C. section 360bbb-3(b)(1), unless the authorization is terminated or revoked sooner. Performed at [facility], [place] [address], [place], [place] [address]   Urine culture     Status: Abnormal (Preliminary result)   Collection Time: 06/28/19  5:43 AM   Specimen: Urine, Random  Result Value Ref Range Status   Specimen Description   Final    URINE, RANDOM Performed at [facility], [place] [address] [place], [place] [address]    Special Requests   Final    NONE Performed at [facility], [place] [address] [place], [place] [address]    Culture (A)  Final    >=100,000 COLONIES/mL ESCHERICHIA COLI REPEATING SENSITIVITIES Performed at [facility], [place] [address], [place], [place] [address]    Report Status PENDING  Incomplete     Studies: No results found.    Flora Lipps, MD  [facility] 07/01/2019

## 2019-07-01 NOTE — Plan of Care (Signed)
  Problem: Health Behavior/Discharge Planning: Goal: Ability to manage health-related needs will improve Outcome: Progressing   Problem: Activity: Goal: Risk for activity intolerance will decrease Outcome: Progressing   Problem: Coping: Goal: Level of anxiety will decrease Outcome: Progressing   

## 2019-07-02 ENCOUNTER — Telehealth: Payer: Self-pay

## 2019-07-02 ENCOUNTER — Other Ambulatory Visit: Payer: Self-pay | Admitting: Student

## 2019-07-02 DIAGNOSIS — I4892 Unspecified atrial flutter: Secondary | ICD-10-CM

## 2019-07-02 DIAGNOSIS — C770 Secondary and unspecified malignant neoplasm of lymph nodes of head, face and neck: Secondary | ICD-10-CM

## 2019-07-02 DIAGNOSIS — J439 Emphysema, unspecified: Secondary | ICD-10-CM

## 2019-07-02 LAB — CBC
HCT: 30.7 % — ABNORMAL LOW (ref 36.0–46.0)
Hemoglobin: 9.9 g/dL — ABNORMAL LOW (ref 12.0–15.0)
MCH: 27.2 pg (ref 26.0–34.0)
MCHC: 32.2 g/dL (ref 30.0–36.0)
MCV: 84.3 fL (ref 80.0–100.0)
Platelets: 524 10*3/uL — ABNORMAL HIGH (ref 150–400)
RBC: 3.64 MIL/uL — ABNORMAL LOW (ref 3.87–5.11)
RDW: 15 % (ref 11.5–15.5)
WBC: 7.6 10*3/uL (ref 4.0–10.5)
nRBC: 0 % (ref 0.0–0.2)

## 2019-07-02 LAB — COMPREHENSIVE METABOLIC PANEL
ALT: 278 U/L — ABNORMAL HIGH (ref 0–44)
AST: 275 U/L — ABNORMAL HIGH (ref 15–41)
Albumin: 2.9 g/dL — ABNORMAL LOW (ref 3.5–5.0)
Alkaline Phosphatase: 386 U/L — ABNORMAL HIGH (ref 38–126)
Anion gap: 10 (ref 5–15)
BUN: 9 mg/dL (ref 8–23)
CO2: 22 mmol/L (ref 22–32)
Calcium: 8.5 mg/dL — ABNORMAL LOW (ref 8.9–10.3)
Chloride: 101 mmol/L (ref 98–111)
Creatinine, Ser: 0.59 mg/dL (ref 0.44–1.00)
GFR calc Af Amer: >60 mL/min (ref >60)
GFR calc non Af Amer: >60 mL/min (ref >60)
Glucose, Bld: 112 mg/dL — ABNORMAL HIGH (ref 70–99)
Potassium: 4.5 mmol/L (ref 3.5–5.1)
Sodium: 133 mmol/L — ABNORMAL LOW (ref 135–145)
Total Bilirubin: 2.3 mg/dL — ABNORMAL HIGH (ref 0.3–1.2)
Total Protein: 7.3 g/dL (ref 6.5–8.1)

## 2019-07-02 LAB — URINE CULTURE: Culture: >=100000 — AB

## 2019-07-02 LAB — MAGNESIUM: Magnesium: 2.3 mg/dL (ref 1.7–2.4)

## 2019-07-02 LAB — PHOSPHORUS: Phosphorus: 3.1 mg/dL (ref 2.5–4.6)

## 2019-07-02 MED ORDER — METOPROLOL SUCCINATE ER 25 MG PO TB24
75.0000 mg | ORAL_TABLET | Freq: Every day | ORAL | 1 refills | Status: DC
Start: 2019-07-02 — End: 2019-07-13

## 2019-07-02 MED ORDER — CLONIDINE HCL 0.2 MG PO TABS: 0.2000 mg | ORAL_TABLET | Freq: Two times a day (BID) | ORAL | 0 refills | Status: AC

## 2019-07-02 MED ORDER — IRBESARTAN 150 MG PO TABS: 150.0000 mg | ORAL_TABLET | Freq: Every day | ORAL | 2 refills | Status: AC

## 2019-07-02 MED ORDER — OXYCODONE HCL ER 15 MG PO T12A: 15.0000 mg | EXTENDED_RELEASE_TABLET | Freq: Two times a day (BID) | ORAL | 0 refills | Status: DC

## 2019-07-02 MED ORDER — METOPROLOL TARTRATE 50 MG PO TABS
50.0000 mg | ORAL_TABLET | Freq: Two times a day (BID) | ORAL | Status: DC
  Administered 2019-07-02: 50 mg via ORAL
  Filled 2019-07-02: qty 1

## 2019-07-02 MED ORDER — SENNA 8.6 MG PO TABS: 1.0000 | ORAL_TABLET | Freq: Two times a day (BID) | ORAL | 0 refills | Status: AC

## 2019-07-02 MED ORDER — NICOTINE 21 MG/24HR TD PT24: 21.0000 mg | MEDICATED_PATCH | Freq: Every day | TRANSDERMAL | 0 refills | Status: AC

## 2019-07-02 MED ORDER — SPIRONOLACTONE 25 MG PO TABS: 12.5000 mg | ORAL_TABLET | Freq: Every day | ORAL | 0 refills | Status: AC

## 2019-07-02 MED ORDER — OXYCODONE HCL ER 10 MG PO T12A: 10.0000 mg | EXTENDED_RELEASE_TABLET | Freq: Two times a day (BID) | ORAL | 0 refills | Status: DC

## 2019-07-02 MED ORDER — OXYCODONE HCL 5 MG PO TABS: 5.0000 mg | ORAL_TABLET | Freq: Four times a day (QID) | ORAL | 0 refills | Status: DC | PRN

## 2019-07-02 NOTE — Progress Notes (Addendum)
Progress Note  Patient Name: Alisha Goodman Date of Encounter: 07/02/2019  Primary Cardiologist: Pixie Casino, MD   Subjective   No acute overnight events. Patient states she feels like she is breathing fine, not much change from admission. No palpitations. No chest pain.   Inpatient Medications    Scheduled Meds: . cloNIDine  0.2 mg Oral BID  . feeding supplement (ENSURE ENLIVE)  237 mL Oral BID BM  . gabapentin  100 mg Oral Daily  . heparin  5,000 Units Subcutaneous Q8H  . irbesartan  150 mg Oral Daily  . metoprolol tartrate  50 mg Oral BID  . multivitamin with minerals  1 tablet Oral Daily  . nicotine  21 mg Transdermal Daily  . oxyCODONE  10 mg Oral Q12H  . phosphorus  500 mg Oral TID  . polyethylene glycol  17 g Oral BID  . senna  1 tablet Oral QHS  . sodium chloride flush  3 mL Intravenous Q12H  . spironolactone  12.5 mg Oral Daily   Continuous Infusions: . cefTRIAXone (ROCEPHIN)  IV Stopped (07/01/19 2331)   PRN Meds: acetaminophen **OR** acetaminophen, albuterol, HYDROmorphone (DILAUDID) injection, ondansetron **OR** ondansetron (ZOFRAN) IV, oxyCODONE   Vital Signs    Vitals:   07/01/19 2104 07/02/19 0023 07/02/19 0438 07/02/19 0438  BP: (!) 147/79   134/72  Pulse: (!) 115   (!) 107  Resp: (!) 22 (!) 27  20  Temp: 98.9 F (37.2 C)   99 F (37.2 C)  TempSrc: Oral   Oral  SpO2: 99%   95%  Weight:   55 kg   Height:        Intake/Output Summary (Last 24 hours) at 07/02/2019 0956 Last data filed at 07/01/2019 2106 Gross per 24 hour  Intake 123 ml  Output --  Net 123 ml   Last 3 Weights 07/02/2019 07/01/2019 06/30/2019  Weight (lbs) 121 lb 4.1 oz 119 lb 8 oz 120 lb 14.4 oz  Weight (kg) 55 kg 54.205 kg 54.84 kg      Telemetry    Rates ranging from the 90's to 120's. Currently in the 110's to 120's. Looks like sinus tachycardia. - Personally Reviewed  ECG    No new ECG tracing today. - Personally Reviewed  Physical Exam   GEN: No acute distress.    Neck: No JVD. Cardiac: Tachycardic with regular rhythm. No murmurs, rubs, or gallops.  Respiratory: Clear to auscultation bilaterally. GI: Soft, non-distended, and non-tender. Bowel sounds present. MS: No lower extremity edema. No deformity. Skin: Warm and dry. Neuro:  No focal deficits. Psych: Normal affect. Responds appropriately.  Labs    High Sensitivity Troponin:  No results for input(s): TROPONINIHS in the last 720 hours.    Chemistry Recent Labs  Lab 06/30/19 0511 07/01/19 0444 07/02/19 0415  NA 135 133* 133*  K 3.4* 4.2 4.5  CL 104 101 101  CO2 21* 23 22  GLUCOSE 88 97 112*  BUN 9 8 9   CREATININE 0.53 0.66 0.59  CALCIUM 8.4* 8.5* 8.5*  PROT 7.1 7.0 7.3  ALBUMIN 3.0* 2.7* 2.9*  AST 134* 197* 275*  ALT 217* 225* 278*  ALKPHOS 298* 337* 386*  BILITOT 1.7* 2.0* 2.3*  GFRNONAA >60 >60 >60  GFRAA >60 >60 >60  ANIONGAP 10 9 10      Hematology Recent Labs  Lab 06/29/19 0509 06/30/19 0511 07/02/19 0415  WBC 11.3* 9.6 7.6  RBC 3.74* 3.49* 3.64*  HGB 10.2* 9.5* 9.9*  HCT 31.4* 29.7* 30.7*  MCV 84.0 85.1 84.3  MCH 27.3 27.2 27.2  MCHC 32.5 32.0 32.2  RDW 14.9 15.0 15.0  PLT 473* 486* 524*    BNP Recent Labs  Lab 06/28/19 0028  BNP 30.2     DDimer No results for input(s): DDIMER in the last 168 hours.   Radiology    No results found.  Cardiac Studies   Echocardiogram 06/28/2019: Impressions: 1. No apical thrombus by Definity contrast. Left ventricular ejection  fraction, by estimation, is 20 to 25%. The left ventricle has severely  decreased function. The left ventricle demonstrates global hypokinesis.  The left ventricular internal cavity  size was mildly dilated. indeterminate diastolic function, due to what  appears to be atrial flutter.  2. Right ventricular systolic function is hyperdynamic. The right  ventricular size is normal.  3. The mitral valve is grossly normal. No evidence of mitral valve  regurgitation.  4. The aortic  valve was not well visualized. Aortic valve regurgitation  is not visualized. Mild aortic valve sclerosis is present, with no  evidence of aortic valve stenosis.  5. The inferior vena cava is normal in size with greater than 50%  respiratory variability, suggesting right atrial pressure of 3 mmHg.   Conclusion(s)/Recommendation(s): Rhythm appears to be atrial flutter, I  have also reviewed the EKG which demonstrates flutter waves.   Patient Profile     74 y.o. female with a history of hypertension, lumbar radiculopathy, and tobacco abuse, who was admitted on 06/28/2019 with back/pelvic pain and was found to have metastatic cancer with diffuse and multifocal malignancy throughout chest and abdomen. Primary is favored to be lung or pancreatic. Cardiology was consulted on 06/30/2019 for evaluation of new onset systolic CHF and possible atrial flutter at the request of Dr. Sarajane Jews.  Assessment & Plan    New Onset Acute Systolic CHF - Echo showed LVEF of 20-25% with global hypokinesis.  - Appears euvolemic on exam. - Started on Irbesartan 150mg  daily on 5/30. Can continue, consider switching to entresto as outpatient - Will increase Lopressor to 50mg  twice daily. Will likely consolidate to Toprol-XL prior to discharge. - Started on Spironolactone on 5/31.  Will continue - Continue to monitor daily weights, strict I/O's, and renal function. - Could consider ischemic evaluation with stress testing as outpatient but likely not a candidate for revascularization given metastatic cancer. - Discussed importance of daily weights and sodium/fluid restriction once discharged.  Possible Atrial Flutter vs Atrial Tachycardia / NSVT - Rates currently in the 120's. EKG confirms sinus tachycardia - Increase Lopressor to 50mg  twice daily.  - If atrial flutter is confirmed, will need to increase anticoagulation from prophylaxis dose to treatment dose. If rhythm remains unclear, will likely discharge patient on  outpatient monitor.  Hypertension - BP relatively well controlled but still elevated at time.  - Home Amlodipine switched to Irbesartan as above. - Continue Lopressor and Spironolactone as above. - Weaning Clonidine to allow for further titration of GDMT for CHF.   Metastatic Cancer - Newly diagnosed on admission. Unknown primary but felt to be lung or pancreas. - Management per Oncology.  Disposition: planning discharge today  [facility] will sign off.   Medication Recommendations:  Irbesartan 150 mg daily (can transition to [facility] as outpatient), toprol XL 75 mg daily, spironolactone 12.5 mg daily, clonidine 0.2 mg BID (can continue to wean off as outpatient).  Other recommendations (labs, testing, etc):  BMET in 1 week.  Zio patch x 2 weeks  to monitor for arrhythmia Follow up as an outpatient:  Will schedule f/u with Dr Debara Pickett   For questions or updates, please contact [place]  Please consult [email or web address] for contact info under       Signed, Darreld Mclean, PA-C  07/02/2019, 9:56 AM    Patient seen and examined.  Agree with above documentation.  On exam, patient is alert and oriented, regular rate, tachycardic, no murmurs, lungs CTAB, no LE edema, +JVD.  She denies any chest pain or dyspnea.  Labs notable for stable renal function (creatinine 0.6 ).  ECG this morning with sinus tachycardia, rate 111, left bundle branch block.  Telemetry shows sinus rhythm with rates 90s to 120s.  For her newly diagnosed combined systolic and diastolic heart failure, will continue irbesartan and spironolactone.  Will increase Lopressor and consolidate to Toprol-XL on discharge.  No clear atrial flutter, remains in sinus tachycardia.  We will plan for cardiac monitor on discharge.  Donato Heinz, MD

## 2019-07-02 NOTE — Progress Notes (Signed)
HEMATOLOGY-ONCOLOGY PROGRESS NOTE  SUBJECTIVE: Reports improvement of her pain this morning.  Had biopsy of the left supraclavicular lymph node performed by IR on 06/28/2019.  Biopsy results are pending.  For possible DC later today.  REVIEW OF SYSTEMS:   Constitutional: Denies fevers, chills  Respiratory: Denies cough, dyspnea or wheezes Cardiovascular: Denies palpitation, chest discomfort Gastrointestinal:  Denies nausea, heartburn or change in bowel habits Skin: Denies abnormal skin rashes All other systems were reviewed with the patient and are negative.  I have reviewed the past medical history, past surgical history, social history and family history with the patient and they are unchanged from previous note.   PHYSICAL EXAMINATION: ECOG PERFORMANCE STATUS: 3 - Symptomatic, >50% confined to bed  Vitals:   07/02/19 0023 07/02/19 0438  BP:  134/72  Pulse:  (!) 107  Resp: (!) 27 20  Temp:  99 F (37.2 C)  SpO2:  95%   Filed Weights   06/30/19 0603 07/01/19 0500 07/02/19 0438  Weight: 54.8 kg 54.2 kg 55 kg    Intake/Output from previous day: 05/31 0701 - 06/01 0700 In: 223 [P.O.:220; I.V.:3] Out: -   GENERAL:alert, no distress and comfortable SKIN: skin color, texture, turgor are normal, no rashes or significant lesions NEURO: alert & oriented x 3 with fluent speech, no focal motor/sensory deficits  LABORATORY DATA:  I have reviewed the data as listed CMP Latest Ref Rng & Units 07/02/2019 07/01/2019 06/30/2019  Glucose 70 - 99 mg/dL 112(H) 97 88  BUN 8 - 23 mg/dL 9 8 9   Creatinine 0.44 - 1.00 mg/dL 0.59 0.66 0.53  Sodium 135 - 145 mmol/L 133(L) 133(L) 135  Potassium 3.5 - 5.1 mmol/L 4.5 4.2 3.4(L)  Chloride 98 - 111 mmol/L 101 101 104  CO2 22 - 32 mmol/L 22 23 21(L)  Calcium 8.9 - 10.3 mg/dL 8.5(L) 8.5(L) 8.4(L)  Total Protein 6.5 - 8.1 g/dL 7.3 7.0 7.1  Total Bilirubin 0.3 - 1.2 mg/dL 2.3(H) 2.0(H) 1.7(H)  Alkaline Phos 38 - 126 U/L 386(H) 337(H) 298(H)  AST 15 -  41 U/L 275(H) 197(H) 134(H)  ALT 0 - 44 U/L 278(H) 225(H) 217(H)    Lab Results  Component Value Date   WBC 7.6 07/02/2019   HGB 9.9 (L) 07/02/2019   HCT 30.7 (L) 07/02/2019   MCV 84.3 07/02/2019   PLT 524 (H) 07/02/2019   NEUTROABS 9.5 (H) 06/28/2019    DG Chest 2 View  Result Date: 06/28/2019 CLINICAL DATA:  Shortness of breath, weakness EXAM: CHEST - 2 VIEW COMPARISON:  Radiograph 06/22/2004 FINDINGS: Irregular dense opacity is seen along the right mediastinal border and projecting over the hilum with a possible hilum overlay sign suggesting either anterior or posterior mediastinal origins. However, suspect that this process may be involving the right central airways given more peripheral wedge like opacity throughout the right lung which could reflect a postobstructive infectious or inflammatory process. Central pulmonary arteries are enlarged. Cardiac size within normal limits. No pneumothorax or visible effusion. No acute osseous or soft tissue abnormality. Telemetry leads overlie the chest. IMPRESSION: Irregular dense opacity along the right mediastinal border and projecting over the hilum with possible involvement of the airways given more peripheral opacity which could reflect a postobstructive process. Further evaluation with cross-sectional imaging is warranted. Central pulmonary arterial enlargement noted as well. May reflect pulmonary artery hypertension. Electronically Signed   By: Lovena Le M.D.   On: 06/28/2019 01:37   CT Chest W Contrast  Result Date: 06/28/2019 CLINICAL  DATA:  Mediastinal mass on x-ray. Elevated LFTs. Left back pain. EXAM: CT CHEST, ABDOMEN, AND PELVIS WITH CONTRAST TECHNIQUE: Multidetector CT imaging of the chest, abdomen and pelvis was performed following the standard protocol during bolus administration of intravenous contrast. CONTRAST:  143m OMNIPAQUE IOHEXOL 300 MG/ML  SOLN COMPARISON:  Chest radiograph earlier this day. Remote abdominal CT 1126  FINDINGS: CT CHEST FINDINGS Cardiovascular: Aortic atherosclerosis. Aortic tortuosity without aneurysm. Heart is normal in size. Coronary artery calcifications. No pericardial effusion. Extensive mediastinal adenopathy causes mass effect and narrowing of the right pulmonary arteries. No obvious intraluminal filling defect. Mediastinum/Nodes: Extensive mediastinal and right hilar adenopathy, with confluent nodal conglomerate extending from the subcarinal station to the level of the mid trachea. Lymph nodes causes mass effect and narrowing of the right upper lobe bronchus bronchus intermedius and both lower lobe bronchi. Representative measurement at the level of the carina of 6.8 x 5.3 cm, series 1, image 23. Subcarinal nodal conglomerate measures 3.4 x 5.2 cm, series 1, image 30. Prevascular nodal mass measuring 5.4 x 2.3 cm, series 1, image 25. Left supraclavicular adenopathy measuring 2 x 2 cm, series 1, image 5. Additional adenopathy at multiple stations. Many of these lymph nodes are partially calcified and low-density/necrotic. There is no left hilar adenopathy. There is no axillary adenopathy. Slight mass effect on the esophagus from nodal conglomerate, which is difficult to delineate in the mid distal portion. No visualized thyroid nodule. Lungs/Pleura: There are multiple right pulmonary nodules. It is difficult to delineate nodules from adenopathy in the central right lung. Adenopathy is irregular borders. Multiple branching nodules in the right upper lobe which may represent lymphangitic spread, for example series 3, image 54. There is a 10 mm medial right lower lobe pulmonary nodule, series 3, image 73. 9 mm right lower lobe nodule series 3, image 87. There is pleural based nodularity in the posterior right upper lobe that is contiguous with adenopathy. Adenopathy causes mass effect and narrowing of the right upper lobe bronchus, bronchus intermedius and right middle and lower lobe bronchi. No significant  pleural effusion. No discrete left lung pulmonary nodules.  Mild emphysema. Musculoskeletal: No blastic or destructive lytic lesions. Right chest wall metastasis, series 1, image 35. CT ABDOMEN PELVIS FINDINGS Hepatobiliary: Large irregular hypodense liver lesion in the inferior liver tip measures 6.1 x 4.3 cm. There is right greater than left intrahepatic biliary ductal dilatation, likely due to mass effect from periportal adenopathy on the common bile duct. Gallbladder is not well visualized. Pancreas: Heterogeneous mass in the pancreatic body measuring 4.3 x 2.5 cm. There is no distal atrophy. Mild proximal ductal dilatation of 5 mm. Spleen: Calcified granuloma.  Splenule inferiorly. Adrenals/Urinary Tract: Nodular thickening of the left adrenal gland up to 11 mm. Heterogeneous right adrenal mass measures 3.3 x 2.6 cm, increased in size from prior exam. There is prominent right renal parenchymal thinning with calyceal dilatation of the upper and lower poles. Large intra caliceal stones in the lower right kidney. There is no significant perinephric edema. Urinary bladder is distended but unremarkable. Stomach/Bowel: No obvious gastric mass. No small bowel obstruction or inflammatory change. High-density material throughout the colon may be contrast or bismuth containing products. Moderate stool proximally. No obvious colonic mass. Appendix is normal. Vascular/Lymphatic: Multifocal abdominal adenopathy. Periportal adenopathy with 3 x 2.3 cm lymph node, series 1, image 66. There are additional enlarged periportal nodes. Multiple enlarged upper abdominal nodes in the retroperitoneum and peripancreatic space. Representative right retroperitoneal node measures 3.0 x 1.8  cm, series 1, image 65. There is an enlarged central mesenteric node at 9 mm, series 1, image 75. Right retroperitoneal adenopathy causes mild mass effect on the IVC. There is a retrocrural node adjacent to the distal aorta. No pelvic adenopathy. Aortic  and branch atherosclerosis. The portal vein is attenuated with mass effect from adjacent adenopathy. Reproductive: The uterus is not seen.  No obvious adnexal mass. Other: No ascites or omental thickening.  No free air. Musculoskeletal: Left paraspinal met measures 3.1 x 2.2 cm, series 168 at the level of L3-L4. No evidence of blastic or destructive lytic lesion. Scoliotic curvature of the spine. IMPRESSION: 1. 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 Site of primary malignancy is not definitively determined, however but favored to represent right lung primary. There is also pancreatic lesion which may be an alternative primary. Tissue sampling is recommended. 2. Bulky mediastinal and right hilar adenopathy, nodal conglomerate causes narrowing of the right upper lobe bronchus, right bronchus intermedius, upper and lower lobe bronchi. Multiple right-sided pulmonary nodules, including pleural based nodules posteriorly. 3. Large liver lesion suspicious for metastasis. There is biliary dilatation which is likely secondary to mass effect on the common bile duct from periportal adenopathy. 4. Multifocal abdominal adenopathy throughout the retroperitoneum, periportal region, central mesentery. 5. Pancreatic mass measuring 4.3 cm which may be metastasis or primary malignancy. 6. Right adrenal mass and left adrenal thickening. 7. Subcutaneous metastasis to the right chest wall. Left paraspinal intramuscular metastasis at the level of L3. 8. Non oncologic findings of right renal atrophy with large nonobstructing stones in the lower right kidney. Aortic Atherosclerosis (ICD10-I70.0) and Emphysema (ICD10-J43.9). Electronically Signed   By: Keith Rake M.D.   On: 06/28/2019 03:46   CT ABDOMEN PELVIS W CONTRAST  Result Date: 06/28/2019 CLINICAL DATA:  Mediastinal mass on x-ray. Elevated LFTs. Left back pain. EXAM: CT CHEST, ABDOMEN, AND PELVIS WITH CONTRAST TECHNIQUE: Multidetector CT imaging  of the chest, abdomen and pelvis was performed following the standard protocol during bolus administration of intravenous contrast. CONTRAST:  183m OMNIPAQUE IOHEXOL 300 MG/ML  SOLN COMPARISON:  Chest radiograph earlier this day. Remote abdominal CT 1126 FINDINGS: CT CHEST FINDINGS Cardiovascular: Aortic atherosclerosis. Aortic tortuosity without aneurysm. Heart is normal in size. Coronary artery calcifications. No pericardial effusion. Extensive mediastinal adenopathy causes mass effect and narrowing of the right pulmonary arteries. No obvious intraluminal filling defect. Mediastinum/Nodes: Extensive mediastinal and right hilar adenopathy, with confluent nodal conglomerate extending from the subcarinal station to the level of the mid trachea. Lymph nodes causes mass effect and narrowing of the right upper lobe bronchus bronchus intermedius and both lower lobe bronchi. Representative measurement at the level of the carina of 6.8 x 5.3 cm, series 1, image 23. Subcarinal nodal conglomerate measures 3.4 x 5.2 cm, series 1, image 30. Prevascular nodal mass measuring 5.4 x 2.3 cm, series 1, image 25. Left supraclavicular adenopathy measuring 2 x 2 cm, series 1, image 5. Additional adenopathy at multiple stations. Many of these lymph nodes are partially calcified and low-density/necrotic. There is no left hilar adenopathy. There is no axillary adenopathy. Slight mass effect on the esophagus from nodal conglomerate, which is difficult to delineate in the mid distal portion. No visualized thyroid nodule. Lungs/Pleura: There are multiple right pulmonary nodules. It is difficult to delineate nodules from adenopathy in the central right lung. Adenopathy is irregular borders. Multiple branching nodules in the right upper lobe which may represent lymphangitic spread, for example series 3, image 54. There is  a 10 mm medial right lower lobe pulmonary nodule, series 3, image 73. 9 mm right lower lobe nodule series 3, image 87.  There is pleural based nodularity in the posterior right upper lobe that is contiguous with adenopathy. Adenopathy causes mass effect and narrowing of the right upper lobe bronchus, bronchus intermedius and right middle and lower lobe bronchi. No significant pleural effusion. No discrete left lung pulmonary nodules.  Mild emphysema. Musculoskeletal: No blastic or destructive lytic lesions. Right chest wall metastasis, series 1, image 35. CT ABDOMEN PELVIS FINDINGS Hepatobiliary: Large irregular hypodense liver lesion in the inferior liver tip measures 6.1 x 4.3 cm. There is right greater than left intrahepatic biliary ductal dilatation, likely due to mass effect from periportal adenopathy on the common bile duct. Gallbladder is not well visualized. Pancreas: Heterogeneous mass in the pancreatic body measuring 4.3 x 2.5 cm. There is no distal atrophy. Mild proximal ductal dilatation of 5 mm. Spleen: Calcified granuloma.  Splenule inferiorly. Adrenals/Urinary Tract: Nodular thickening of the left adrenal gland up to 11 mm. Heterogeneous right adrenal mass measures 3.3 x 2.6 cm, increased in size from prior exam. There is prominent right renal parenchymal thinning with calyceal dilatation of the upper and lower poles. Large intra caliceal stones in the lower right kidney. There is no significant perinephric edema. Urinary bladder is distended but unremarkable. Stomach/Bowel: No obvious gastric mass. No small bowel obstruction or inflammatory change. High-density material throughout the colon may be contrast or bismuth containing products. Moderate stool proximally. No obvious colonic mass. Appendix is normal. Vascular/Lymphatic: Multifocal abdominal adenopathy. Periportal adenopathy with 3 x 2.3 cm lymph node, series 1, image 66. There are additional enlarged periportal nodes. Multiple enlarged upper abdominal nodes in the retroperitoneum and peripancreatic space. Representative right retroperitoneal node measures 3.0  x 1.8 cm, series 1, image 65. There is an enlarged central mesenteric node at 9 mm, series 1, image 75. Right retroperitoneal adenopathy causes mild mass effect on the IVC. There is a retrocrural node adjacent to the distal aorta. No pelvic adenopathy. Aortic and branch atherosclerosis. The portal vein is attenuated with mass effect from adjacent adenopathy. Reproductive: The uterus is not seen.  No obvious adnexal mass. Other: No ascites or omental thickening.  No free air. Musculoskeletal: Left paraspinal met measures 3.1 x 2.2 cm, series 168 at the level of L3-L4. No evidence of blastic or destructive lytic lesion. Scoliotic curvature of the spine. IMPRESSION: 1. 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 Site of primary malignancy is not definitively determined, however but favored to represent right lung primary. There is also pancreatic lesion which may be an alternative primary. Tissue sampling is recommended. 2. Bulky mediastinal and right hilar adenopathy, nodal conglomerate causes narrowing of the right upper lobe bronchus, right bronchus intermedius, upper and lower lobe bronchi. Multiple right-sided pulmonary nodules, including pleural based nodules posteriorly. 3. Large liver lesion suspicious for metastasis. There is biliary dilatation which is likely secondary to mass effect on the common bile duct from periportal adenopathy. 4. Multifocal abdominal adenopathy throughout the retroperitoneum, periportal region, central mesentery. 5. Pancreatic mass measuring 4.3 cm which may be metastasis or primary malignancy. 6. Right adrenal mass and left adrenal thickening. 7. Subcutaneous metastasis to the right chest wall. Left paraspinal intramuscular metastasis at the level of L3. 8. Non oncologic findings of right renal atrophy with large nonobstructing stones in the lower right kidney. Aortic Atherosclerosis (ICD10-I70.0) and Emphysema (ICD10-J43.9). Electronically Signed   By:  Aurther Loft.D.  On: 06/28/2019 03:46   ECHOCARDIOGRAM COMPLETE  Result Date: 06/28/2019    ECHOCARDIOGRAM REPORT   Patient Name:   ALYIA LACERTE Date of Exam: 06/28/2019 Medical Rec #:  [id number]       Height:       65.0 in Accession #:    [id number]      Weight:       129.0 lb Date of Birth:  08/31/1949        BSA:          1.642 m Patient Age:    47 years        BP:           133/81 mmHg Patient Gender: F               HR:           115 bpm. Exam Location:  Inpatient Procedure: 2D Echo, Cardiac Doppler, Color Doppler and Intracardiac            Opacification Agent Indications:    Chest Pain 786.50 / R07.9  History:        Patient has no prior history of Echocardiogram examinations.                 Risk Factors:Former Smoker.  Sonographer:    Vickie Epley RDCS Referring Phys: [id number] [facility] A THOM[facility]  Sonographer Comments: Suboptimal parasternal window. IMPRESSIONS  1. No apical thrombus by Definity contrast. Left ventricular ejection fraction, by estimation, is 20 to 25%. The left ventricle has severely decreased function. The left ventricle demonstrates global hypokinesis. The left ventricular internal cavity size was mildly dilated. indeterminate diastolic function, due to what appears to be atrial flutter.  2. Right ventricular systolic function is hyperdynamic. The right ventricular size is normal.  3. The mitral valve is grossly normal. No evidence of mitral valve regurgitation.  4. The aortic valve was not well visualized. Aortic valve regurgitation is not visualized. Mild aortic valve sclerosis is present, with no evidence of aortic valve stenosis.  5. The inferior vena cava is normal in size with greater than 50% respiratory variability, suggesting right atrial pressure of 3 mmHg. Conclusion(s)/Recommendation(s): Rhythm appears to be atrial flutter, I have also reviewed the EKG which demonstrates flutter waves. FINDINGS  Left Ventricle: No apical thrombus by Definity contrast. Left ventricular  ejection fraction, by estimation, is 20 to 25%. The left ventricle has severely decreased function. The left ventricle demonstrates global hypokinesis. Definity contrast agent was  given IV to delineate the left ventricular endocardial borders. The left ventricular internal cavity size was mildly dilated. There is no left ventricular hypertrophy. Left ventricular diastolic function could not be evaluated due to possible atrial flutter. Indeterminate diastolic function, due to what appears to be atrial flutter. Right Ventricle: The right ventricular size is normal. No increase in right ventricular wall thickness. Right ventricular systolic function is hyperdynamic. Left Atrium: Left atrial size was normal in size. Right Atrium: Right atrial size was normal in size. Pericardium: There is no evidence of pericardial effusion. Mitral Valve: The mitral valve is grossly normal. No evidence of mitral valve regurgitation. Tricuspid Valve: The tricuspid valve is grossly normal. Tricuspid valve regurgitation is not demonstrated. Aortic Valve: The aortic valve was not well visualized. Aortic valve regurgitation is not visualized. Mild aortic valve sclerosis is present, with no evidence of aortic valve stenosis. Pulmonic Valve: The pulmonic valve was normal in structure. Pulmonic valve regurgitation is not visualized. Aorta: The aortic root and ascending  aorta are structurally normal, with no evidence of dilitation. Venous: The inferior vena cava is normal in size with greater than 50% respiratory variability, suggesting right atrial pressure of 3 mmHg. IAS/Shunts: No atrial level shunt detected by color flow Doppler.  LEFT VENTRICLE PLAX 2D LVIDd:         4.04 cm LVIDs:         3.62 cm LV PW:         0.76 cm LV IVS:        0.77 cm LVOT diam:     1.90 cm LV SV:         48 LV SV Index:   29 LVOT Area:     2.84 cm  LV Volumes (MOD) LV vol d, MOD A2C: 71.0 ml LV vol d, MOD A4C: 68.8 ml LV vol s, MOD A2C: 49.9 ml LV vol s, MOD A4C:  60.4 ml LV SV MOD A2C:     21.1 ml LV SV MOD A4C:     68.8 ml LV SV MOD BP:      14.9 ml RIGHT VENTRICLE TAPSE (M-mode): 2.2 cm LEFT ATRIUM             Index       RIGHT ATRIUM          Index LA diam:        2.50 cm 1.52 cm/m  RA Area:     8.27 cm LA Vol (A2C):   31.1 ml 18.94 ml/m RA Volume:   15.30 ml 9.32 ml/m LA Vol (A4C):   18.0 ml 10.96 ml/m LA Biplane Vol: 23.7 ml 14.44 ml/m  AORTIC VALVE LVOT Vmax:   113.00 cm/s LVOT Vmean:  73.900 cm/s LVOT VTI:    0.169 m  AORTA Ao Root diam: 2.90 cm  SHUNTS Systemic VTI:  0.17 m Systemic Diam: 1.90 cm Lyman Bishop MD Electronically signed by Lyman Bishop MD Signature Date/Time: 06/28/2019/3:34:15 PM    Final    [place] CORE BIOPSY (LYMPH NODES)  Result Date: 06/28/2019 INDICATION: 74 year old female with a 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 ANESTHESIA/SEDATION: None FLUOROSCOPY TIME:  None COMPLICATIONS: None PROCEDURE: Informed written consent was obtained from the patient after a thorough discussion of the procedural risks, benefits and alternatives. All questions were addressed. Maximal Sterile Barrier Technique was utilized including caps, mask, sterile gowns, sterile gloves, sterile drape, hand hygiene and skin antiseptic. A timeout was performed prior to the initiation of the procedure. Ultrasound survey was performed with images stored and sent to PACs. The left neck was prepped with chlorhexidine in a sterile fashion, and a sterile drape was applied covering the operative field. A sterile gown and sterile gloves were used for the procedure. Local anesthesia was provided with 1% Lidocaine. Ultrasound guidance was used to infiltrate the region with 1% lidocaine for local anesthesia. A small stab incision was made with 11 blade scalpel. Using ultrasound guidance, multiple 18 gauge core biopsy were acquired of the pathologic left  supraclavicular node. These were placed in day fresh specimen container. Final image was stored after biopsy. Patient tolerated the procedure well and remained hemodynamically stable throughout. No complications were encountered and no significant blood loss was encounter IMPRESSION: Status post ultrasound-guided biopsy of left supraclavicular pathologic node. Signed, Dulcy Fanny. Dellia Nims, RPVI [facility] Electronically Signed   By: Corrie Mckusick D.O.   On: 06/28/2019 17:11  ASSESSMENT AND PLAN: 74 year old African-American female  1.  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  Primary is likely lung cancer or pancreatic cancer. 2.  Left hip and thigh pain, possible related to the left paraspinal metastatic lesion 3. Weight loss  4.  Systolic heart failure 5.  Atrial flutter versus atrial tachycardia 6.  Hypertension  -Discussed with the patient and her family that biopsy results are still currently pending.  Anticipate we may have a preliminary result tomorrow. -Her pain is currently well controlled.  She is hoping to discharge to home later today.  We can ask radiation oncology to see her as an outpatient for consideration of palliative radiation to the left paraspinal mass. -I have arranged outpatient follow-up with Dr. Burr Medico at the cancer center on 07/04/2019.   LOS: 4 days   Mikey Bussing, DNP, AGPCNP-BC, AOCNP 07/02/19

## 2019-07-02 NOTE — Discharge Instructions (Signed)

## 2019-07-02 NOTE — Telephone Encounter (Signed)
Called & spoke to pt's Husband...went over monitor instructions. verified address. 2 week Zio ordered and mailed to pt.

## 2019-07-02 NOTE — TOC Initial Note (Signed)
Transition of Care [facility]) - Initial/Assessment Note    Patient Details  Name: Alisha Goodman MRN: [id number] Date of Birth: [birth year]  Transition of Care [facility]) CM/SW Contact:    Dessa Phi, RN Phone Number: 07/02/2019, 11:09 AM  Clinical Narrative: Patient has [id number] to dtr Denman George in rm about d/c plans-corrected DOB in our system by admitting-chose [facility] for HHTP-rep Santiago Glad aware-declined rw,3n1. No further CM needs.                   Expected Discharge Plan: [place] Barriers to Discharge: No Barriers Identified   Patient Goals and CMS Choice Patient states their goals for this hospitalization and ongoing recovery are:: go home CMS Medicare.gov Compare Post Acute Care list provided to:: Patient Represenative (must comment)(dtr [facility]) Choice offered to / list presented to : Adult Children  Expected Discharge Plan and Services Expected Discharge Plan: [place]   Discharge Planning Services: CM Consult Post Acute Care Choice: [place] arrangements for the past 2 months: Single Family Home                 DME Arranged: Other see comment(decline rw,3n1)         HH Arranged: PT [place]: [facility] ([place]) Date [place]: 07/02/19 Time [place]: 1108 Representative spoke with at [place]: Santiago Glad  Prior Living Arrangements/Services Living arrangements for the past 2 months: [place] with:: Spouse Patient language and need for interpreter reviewed:: Yes Do you feel safe going back to the place where you live?: Yes      Need for Family Participation in Patient Care: No (Comment) Care giver support system in place?: Yes (comment) Current home services: DME Criminal Activity/Legal Involvement Pertinent to Current Situation/Hospitalization: No - Comment as needed  Activities of Daily Living Home Assistive Devices/Equipment: Eyeglasses, Dentures (specify  type)(upper/lower dentures) ADL Screening (condition at time of admission) Patient's cognitive ability adequate to safely complete daily activities?: Yes Is the patient deaf or have difficulty hearing?: No Does the patient have difficulty seeing, even when wearing glasses/contacts?: No Does the patient have difficulty concentrating, remembering, or making decisions?: Yes Patient able to express need for assistance with ADLs?: No Does the patient have difficulty dressing or bathing?: No Independently performs ADLs?: Yes (appropriate for developmental age) Does the patient have difficulty walking or climbing stairs?: No Weakness of Legs: None Weakness of Arms/Hands: None  Permission Sought/Granted Permission sought to share information with : Case Manager Permission granted to share information with : Yes, Verbal Permission Granted  Share Information with NAME: Case Manager  Permission granted to share info w AGENCY: [facility]     Permission granted to share info w Contact Information: Yolanda Pritchett dtr [phone].  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 Chronic systolic CHF (congestive heart failure) ([place]) 06/30/2019  . Pain of metastatic malignancy   . Acute systolic (congestive) heart failure ([place]) 06/29/2019  . Atrial flutter ([place]) 06/29/2019  . Hyponatremia 06/29/2019  . Elevated LFTs 06/29/2019  . Aortic atherosclerosis ([place]) 06/29/2019  . Emphysema of lung ([place]) 06/29/2019  . Metastatic cancer ([place]) 06/28/2019  PCP:  Jolinda Croak, MD Pharmacy:    [facility] [address] [place] [address] Phone: [phone] Fax: [phone]     Social Determinants of Health (SDOH) Interventions    Readmission Risk Interventions No flowsheet data found.

## 2019-07-02 NOTE — Care Management Important Message (Signed)
Important Message  Patient Details IM Letter given to Dessa Phi RN Case Manager to present to the Patient Name: Alisha Goodman MRN: [id number] Date of Birth: [birth year]   Medicare Important Message Given:  Yes     Kerin Salen 07/02/2019, 12:06 PM

## 2019-07-02 NOTE — Progress Notes (Signed)
Surgical path still pending  Has onc follow up and is being planned for dc We will be available for re-consultation as out pt if needed.   Erick Colace ACNP-BC [place] Pager # [phone] OR # [phone] if no answer

## 2019-07-02 NOTE — Discharge Summary (Signed)
Physician Discharge Summary  Alisha Goodman FGH:[id number] DOB: 05/02/1945 DOA: 06/28/2019  PCP: Jolinda Croak, MD  Admit date: 06/28/2019 Discharge date: 07/02/2019  Admitted From: Home  Discharge disposition: Home health PT  Recommendations for Outpatient Follow-Up:   . Follow up with your primary care provider in one week.  . Check CBC, BMP , magnesium in the next visit. . Follow-up with cardiology clinic as scheduled by the cardiology clinic.  Patient will need Zio patch after discharge. . Follow-up with Dr. Burr Medico oncology as scheduled on 07/04/2019 . Your medication has been changed please take a note. . Please discuss with your oncologist/primary care physician for ongoing treatment with your pain medication. . Dose of clonidine has been decreased with plan for subsequent discontinuation.   Discharge Diagnosis:   Principal Problem:   Metastatic cancer ([place]) Active Problems:   Acute systolic (congestive) heart failure (HCC)   Atrial flutter (HCC)   Hyponatremia   Elevated LFTs   Aortic atherosclerosis (HCC)   Emphysema of lung (HCC)   Pain of metastatic malignancy   Chronic systolic CHF (congestive heart failure) ([employer])  Discharge Condition: Improved.  Diet recommendation: Low sodium, heart healthy.  .  Wound care: None.  Code status: Full.   History of Present Illness:    Patient is a 74 year old woman PMH lumbar radiculopathy, smoker, presented to the emergency department with progressive left leg pain for several weeks with weight loss.  She was then found to have widely metastatic disease of unknown primary.  She was admitted for treatment of leg pain and further evaluation.  Seen by interventional radiology and underwent left supraclavicular node biopsy 5/28.  Seen by oncology with recommendation for close outpatient follow-up, Dr. Burr Medico in communication with radiation oncology and will coordinate care for consideration of radiation therapy as outpatient..   Also seen by pulmonology for metastatic disease.  Pain control improved.  New diagnosis of systolic CHF in the hospital and the patient was followed by cardiology.    Hospital Course:   Following conditions were addressed during hospitalization as listed below,  Metastatic cancer with multifocal lesions throughout the chest and abdomen , primary unclear but possibly right lung primary versus pancreatic mass.  Patient complains of left leg pain likely secondary to left paraspinal mass.  Patient also has right renal mass, subcutaneous mass in the right chest wall, mediastinal and hilar lymphadenopathy with narrowing of the right lung bronchus.  Patient will need home PT on discharge.  Patient has been changed to long-acting narcotics with better pain control.  Underwent biopsy on 5/28 which is pending at this time.  Will be followed by oncology Dr. Burr Medico as outpatient will likely need radiation treatment as outpatient.   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 -AST 197 ALT 225 and alkaline phosphatase 337.  Bilirubin total at 2.0.  Recommend outpatient monitoring with LFTs.   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  Sodium of 133 today discussed with.     Hypophosphatemia.  Improved.  Phosphorus of 3.1 on discharge.   Acute systolic heart failure with severe systolic dysfunction LVEF 20-25% with global hypokinesis.    Cardiology was consulted.  EKG showed left bundle branch block of unclear chronicity.   Continue metoprolol, irbesartan on discharge..  Lisinopril-HCTZ and amlodipine has been discontinued by cardiology. Spironolactone has been added as well. Clonidine dose has been decreased with plans for subsequent discontinuation as outpatient..  Cardiology has seen the patient  today and recommend oral medications and outpatient follow-up with Zio patch.  Spoke with cardiology prior to discharge.     Possible atrial flutter versus NSVT Cardiology saw the patient.  Continue metoprolol on discharge.  Will need Zio patch on discharge.  Cardiology clinic to make arrangements for that.   Anemia of chronic disease No evidence of bleeding.  Check CBC closely    Emphysema.  No acute pulmonary issues.   Episode of fever.    Resolved.  No urinary symptoms.  Urine culture showed E. coli.  Received IV Rocephin while in the hospital.  Disposition.  At this time, patient is stable for disposition home with home health.  Spoke with the patient's daughter on the phone regarding disposition.  Also spoke with the patient's husband and son at bedside.  Medical Consultants:    Oncology  Interventional radiology  Pulmonology  Procedures:     5/28 ultrasound-guided biopsy pathologic left supraclavicular lymph node Subjective:   Today, patient is okay.  Denies overt shortness of breath, cough, chest pain, feels better with the leg pain.  Discharge Exam:   Vitals:   07/02/19 0438 07/02/19 1329  BP: 134/72 122/68  Pulse: (!) 107 93  Resp: 20 18  Temp: 99 F (37.2 C) (!) 97.3 F (36.3 C)  SpO2: 95% 100%   Vitals:   07/02/19 0023 07/02/19 0438 07/02/19 0438 07/02/19 1329  BP:   134/72 122/68  Pulse:   (!) 107 93  Resp: (!) 27  20 18   Temp:   99 F (37.2 C) (!) 97.3 F (36.3 C)  TempSrc:   Oral Oral  SpO2:   95% 100%  Weight:  55 kg    Height:        General: Alert awake, not in obvious distress HENT: pupils equally reacting to light,  No scleral pallor or icterus noted. Oral mucosa is moist.  Chest:  Clear breath sounds.  Diminished breath sounds bilaterally. No crackles or wheezes.  CVS: S1 &S2 heard. No murmur.  Regular rate and rhythm. Abdomen: Soft, nontender, nondistended.  Bowel sounds are heard.   Extremities: No cyanosis, clubbing or edema.  Peripheral pulses are palpable. Psych: Alert, awake and oriented, normal mood CNS:  No cranial nerve deficits.  Power equal in  all extremities.   Skin: Warm and dry.  No rashes noted.  The results of significant diagnostics from this hospitalization (including imaging, microbiology, ancillary and laboratory) are listed below for reference.     Diagnostic Studies:   DG Chest 2 View  Result Date: 06/28/2019 CLINICAL DATA:  Shortness of breath, weakness EXAM: CHEST - 2 VIEW COMPARISON:  Radiograph 06/22/2004 FINDINGS: Irregular dense opacity is seen along the right mediastinal border and projecting over the hilum with a possible hilum overlay sign suggesting either anterior or posterior mediastinal origins. However, suspect that this process may be involving the right central airways given more peripheral wedge like opacity throughout the right lung which could reflect a postobstructive infectious or inflammatory process. Central pulmonary arteries are enlarged. Cardiac size within normal limits. No pneumothorax or visible effusion. No acute osseous or soft tissue abnormality. Telemetry leads overlie the chest. IMPRESSION: Irregular dense opacity along the right mediastinal border and projecting over the hilum with possible involvement of the airways given more peripheral opacity which could reflect a postobstructive process. Further evaluation with cross-sectional imaging is warranted. Central pulmonary arterial enlargement noted as well. May reflect pulmonary artery hypertension. Electronically Signed   By: Elwin Sleight.D.  On: 06/28/2019 01:37   CT Chest W Contrast  Result Date: 06/28/2019 CLINICAL DATA:  Mediastinal mass on x-ray. Elevated LFTs. Left back pain. EXAM: CT CHEST, ABDOMEN, AND PELVIS WITH CONTRAST TECHNIQUE: Multidetector CT imaging of the chest, abdomen and pelvis was performed following the standard protocol during bolus administration of intravenous contrast. CONTRAST:  1105m OMNIPAQUE IOHEXOL 300 MG/ML  SOLN COMPARISON:  Chest radiograph earlier this day. Remote abdominal CT 1126 FINDINGS: CT CHEST  FINDINGS Cardiovascular: Aortic atherosclerosis. Aortic tortuosity without aneurysm. Heart is normal in size. Coronary artery calcifications. No pericardial effusion. Extensive mediastinal adenopathy causes mass effect and narrowing of the right pulmonary arteries. No obvious intraluminal filling defect. Mediastinum/Nodes: Extensive mediastinal and right hilar adenopathy, with confluent nodal conglomerate extending from the subcarinal station to the level of the mid trachea. Lymph nodes causes mass effect and narrowing of the right upper lobe bronchus bronchus intermedius and both lower lobe bronchi. Representative measurement at the level of the carina of 6.8 x 5.3 cm, series 1, image 23. Subcarinal nodal conglomerate measures 3.4 x 5.2 cm, series 1, image 30. Prevascular nodal mass measuring 5.4 x 2.3 cm, series 1, image 25. Left supraclavicular adenopathy measuring 2 x 2 cm, series 1, image 5. Additional adenopathy at multiple stations. Many of these lymph nodes are partially calcified and low-density/necrotic. There is no left hilar adenopathy. There is no axillary adenopathy. Slight mass effect on the esophagus from nodal conglomerate, which is difficult to delineate in the mid distal portion. No visualized thyroid nodule. Lungs/Pleura: There are multiple right pulmonary nodules. It is difficult to delineate nodules from adenopathy in the central right lung. Adenopathy is irregular borders. Multiple branching nodules in the right upper lobe which may represent lymphangitic spread, for example series 3, image 54. There is a 10 mm medial right lower lobe pulmonary nodule, series 3, image 73. 9 mm right lower lobe nodule series 3, image 87. There is pleural based nodularity in the posterior right upper lobe that is contiguous with adenopathy. Adenopathy causes mass effect and narrowing of the right upper lobe bronchus, bronchus intermedius and right middle and lower lobe bronchi. No significant pleural effusion.  No discrete left lung pulmonary nodules.  Mild emphysema. Musculoskeletal: No blastic or destructive lytic lesions. Right chest wall metastasis, series 1, image 35. CT ABDOMEN PELVIS FINDINGS Hepatobiliary: Large irregular hypodense liver lesion in the inferior liver tip measures 6.1 x 4.3 cm. There is right greater than left intrahepatic biliary ductal dilatation, likely due to mass effect from periportal adenopathy on the common bile duct. Gallbladder is not well visualized. Pancreas: Heterogeneous mass in the pancreatic body measuring 4.3 x 2.5 cm. There is no distal atrophy. Mild proximal ductal dilatation of 5 mm. Spleen: Calcified granuloma.  Splenule inferiorly. Adrenals/Urinary Tract: Nodular thickening of the left adrenal gland up to 11 mm. Heterogeneous right adrenal mass measures 3.3 x 2.6 cm, increased in size from prior exam. There is prominent right renal parenchymal thinning with calyceal dilatation of the upper and lower poles. Large intra caliceal stones in the lower right kidney. There is no significant perinephric edema. Urinary bladder is distended but unremarkable. Stomach/Bowel: No obvious gastric mass. No small bowel obstruction or inflammatory change. High-density material throughout the colon may be contrast or bismuth containing products. Moderate stool proximally. No obvious colonic mass. Appendix is normal. Vascular/Lymphatic: Multifocal abdominal adenopathy. Periportal adenopathy with 3 x 2.3 cm lymph node, series 1, image 66. There are additional enlarged periportal nodes. Multiple enlarged upper abdominal nodes  in the retroperitoneum and peripancreatic space. Representative right retroperitoneal node measures 3.0 x 1.8 cm, series 1, image 65. There is an enlarged central mesenteric node at 9 mm, series 1, image 75. Right retroperitoneal adenopathy causes mild mass effect on the IVC. There is a retrocrural node adjacent to the distal aorta. No pelvic adenopathy. Aortic and branch  atherosclerosis. The portal vein is attenuated with mass effect from adjacent adenopathy. Reproductive: The uterus is not seen.  No obvious adnexal mass. Other: No ascites or omental thickening.  No free air. Musculoskeletal: Left paraspinal met measures 3.1 x 2.2 cm, series 168 at the level of L3-L4. No evidence of blastic or destructive lytic lesion. Scoliotic curvature of the spine. IMPRESSION: 1. 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 Site of primary malignancy is not definitively determined, however but favored to represent right lung primary. There is also pancreatic lesion which may be an alternative primary. Tissue sampling is recommended. 2. Bulky mediastinal and right hilar adenopathy, nodal conglomerate causes narrowing of the right upper lobe bronchus, right bronchus intermedius, upper and lower lobe bronchi. Multiple right-sided pulmonary nodules, including pleural based nodules posteriorly. 3. Large liver lesion suspicious for metastasis. There is biliary dilatation which is likely secondary to mass effect on the common bile duct from periportal adenopathy. 4. Multifocal abdominal adenopathy throughout the retroperitoneum, periportal region, central mesentery. 5. Pancreatic mass measuring 4.3 cm which may be metastasis or primary malignancy. 6. Right adrenal mass and left adrenal thickening. 7. Subcutaneous metastasis to the right chest wall. Left paraspinal intramuscular metastasis at the level of L3. 8. Non oncologic findings of right renal atrophy with large nonobstructing stones in the lower right kidney. Aortic Atherosclerosis (ICD10-I70.0) and Emphysema (ICD10-J43.9). Electronically Signed   By: Keith Rake M.D.   On: 06/28/2019 03:46   CT ABDOMEN PELVIS W CONTRAST  Result Date: 06/28/2019 CLINICAL DATA:  Mediastinal mass on x-ray. Elevated LFTs. Left back pain. EXAM: CT CHEST, ABDOMEN, AND PELVIS WITH CONTRAST TECHNIQUE: Multidetector CT imaging of the  chest, abdomen and pelvis was performed following the standard protocol during bolus administration of intravenous contrast. CONTRAST:  11m OMNIPAQUE IOHEXOL 300 MG/ML  SOLN COMPARISON:  Chest radiograph earlier this day. Remote abdominal CT 1126 FINDINGS: CT CHEST FINDINGS Cardiovascular: Aortic atherosclerosis. Aortic tortuosity without aneurysm. Heart is normal in size. Coronary artery calcifications. No pericardial effusion. Extensive mediastinal adenopathy causes mass effect and narrowing of the right pulmonary arteries. No obvious intraluminal filling defect. Mediastinum/Nodes: Extensive mediastinal and right hilar adenopathy, with confluent nodal conglomerate extending from the subcarinal station to the level of the mid trachea. Lymph nodes causes mass effect and narrowing of the right upper lobe bronchus bronchus intermedius and both lower lobe bronchi. Representative measurement at the level of the carina of 6.8 x 5.3 cm, series 1, image 23. Subcarinal nodal conglomerate measures 3.4 x 5.2 cm, series 1, image 30. Prevascular nodal mass measuring 5.4 x 2.3 cm, series 1, image 25. Left supraclavicular adenopathy measuring 2 x 2 cm, series 1, image 5. Additional adenopathy at multiple stations. Many of these lymph nodes are partially calcified and low-density/necrotic. There is no left hilar adenopathy. There is no axillary adenopathy. Slight mass effect on the esophagus from nodal conglomerate, which is difficult to delineate in the mid distal portion. No visualized thyroid nodule. Lungs/Pleura: There are multiple right pulmonary nodules. It is difficult to delineate nodules from adenopathy in the central right lung. Adenopathy is irregular borders. Multiple branching nodules in the right upper  lobe which may represent lymphangitic spread, for example series 3, image 54. There is a 10 mm medial right lower lobe pulmonary nodule, series 3, image 73. 9 mm right lower lobe nodule series 3, image 87. There is  pleural based nodularity in the posterior right upper lobe that is contiguous with adenopathy. Adenopathy causes mass effect and narrowing of the right upper lobe bronchus, bronchus intermedius and right middle and lower lobe bronchi. No significant pleural effusion. No discrete left lung pulmonary nodules.  Mild emphysema. Musculoskeletal: No blastic or destructive lytic lesions. Right chest wall metastasis, series 1, image 35. CT ABDOMEN PELVIS FINDINGS Hepatobiliary: Large irregular hypodense liver lesion in the inferior liver tip measures 6.1 x 4.3 cm. There is right greater than left intrahepatic biliary ductal dilatation, likely due to mass effect from periportal adenopathy on the common bile duct. Gallbladder is not well visualized. Pancreas: Heterogeneous mass in the pancreatic body measuring 4.3 x 2.5 cm. There is no distal atrophy. Mild proximal ductal dilatation of 5 mm. Spleen: Calcified granuloma.  Splenule inferiorly. Adrenals/Urinary Tract: Nodular thickening of the left adrenal gland up to 11 mm. Heterogeneous right adrenal mass measures 3.3 x 2.6 cm, increased in size from prior exam. There is prominent right renal parenchymal thinning with calyceal dilatation of the upper and lower poles. Large intra caliceal stones in the lower right kidney. There is no significant perinephric edema. Urinary bladder is distended but unremarkable. Stomach/Bowel: No obvious gastric mass. No small bowel obstruction or inflammatory change. High-density material throughout the colon may be contrast or bismuth containing products. Moderate stool proximally. No obvious colonic mass. Appendix is normal. Vascular/Lymphatic: Multifocal abdominal adenopathy. Periportal adenopathy with 3 x 2.3 cm lymph node, series 1, image 66. There are additional enlarged periportal nodes. Multiple enlarged upper abdominal nodes in the retroperitoneum and peripancreatic space. Representative right retroperitoneal node measures 3.0 x 1.8 cm,  series 1, image 65. There is an enlarged central mesenteric node at 9 mm, series 1, image 75. Right retroperitoneal adenopathy causes mild mass effect on the IVC. There is a retrocrural node adjacent to the distal aorta. No pelvic adenopathy. Aortic and branch atherosclerosis. The portal vein is attenuated with mass effect from adjacent adenopathy. Reproductive: The uterus is not seen.  No obvious adnexal mass. Other: No ascites or omental thickening.  No free air. Musculoskeletal: Left paraspinal met measures 3.1 x 2.2 cm, series 168 at the level of L3-L4. No evidence of blastic or destructive lytic lesion. Scoliotic curvature of the spine. IMPRESSION: 1. 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 Site of primary malignancy is not definitively determined, however but favored to represent right lung primary. There is also pancreatic lesion which may be an alternative primary. Tissue sampling is recommended. 2. Bulky mediastinal and right hilar adenopathy, nodal conglomerate causes narrowing of the right upper lobe bronchus, right bronchus intermedius, upper and lower lobe bronchi. Multiple right-sided pulmonary nodules, including pleural based nodules posteriorly. 3. Large liver lesion suspicious for metastasis. There is biliary dilatation which is likely secondary to mass effect on the common bile duct from periportal adenopathy. 4. Multifocal abdominal adenopathy throughout the retroperitoneum, periportal region, central mesentery. 5. Pancreatic mass measuring 4.3 cm which may be metastasis or primary malignancy. 6. Right adrenal mass and left adrenal thickening. 7. Subcutaneous metastasis to the right chest wall. Left paraspinal intramuscular metastasis at the level of L3. 8. Non oncologic findings of right renal atrophy with large nonobstructing stones in the lower right kidney. Aortic Atherosclerosis (  ICD10-I70.0) and Emphysema (ICD10-J43.9). Electronically Signed   By: Keith Rake  M.D.   On: 06/28/2019 03:46   ECHOCARDIOGRAM COMPLETE  Result Date: 06/28/2019    ECHOCARDIOGRAM REPORT   Patient Name:   CRYSTALLE POPWELL Date of Exam: 06/28/2019 Medical Rec #:  [id number]       Height:       65.0 in Accession #:    [id number]      Weight:       129.0 lb Date of Birth:  08/31/1949        BSA:          1.642 m Patient Age:    64 years        BP:           133/81 mmHg Patient Gender: F               HR:           115 bpm. Exam Location:  Inpatient Procedure: 2D Echo, Cardiac Doppler, Color Doppler and Intracardiac            Opacification Agent Indications:    Chest Pain 786.50 / R07.9  History:        Patient has no prior history of Echocardiogram examinations.                 Risk Factors:Former Smoker.  Sonographer:    Vickie Epley RDCS Referring Phys: [id number] [facility] A THOMAS  Sonographer Comments: Suboptimal parasternal window. IMPRESSIONS  1. No apical thrombus by Definity contrast. Left ventricular ejection fraction, by estimation, is 20 to 25%. The left ventricle has severely decreased function. The left ventricle demonstrates global hypokinesis. The left ventricular internal cavity size was mildly dilated. indeterminate diastolic function, due to what appears to be atrial flutter.  2. Right ventricular systolic function is hyperdynamic. The right ventricular size is normal.  3. The mitral valve is grossly normal. No evidence of mitral valve regurgitation.  4. The aortic valve was not well visualized. Aortic valve regurgitation is not visualized. Mild aortic valve sclerosis is present, with no evidence of aortic valve stenosis.  5. The inferior vena cava is normal in size with greater than 50% respiratory variability, suggesting right atrial pressure of 3 mmHg. Conclusion(s)/Recommendation(s): Rhythm appears to be atrial flutter, I have also reviewed the EKG which demonstrates flutter waves. FINDINGS  Left Ventricle: No apical thrombus by Definity contrast. Left ventricular ejection  fraction, by estimation, is 20 to 25%. The left ventricle has severely decreased function. The left ventricle demonstrates global hypokinesis. Definity contrast agent was  given IV to delineate the left ventricular endocardial borders. The left ventricular internal cavity size was mildly dilated. There is no left ventricular hypertrophy. Left ventricular diastolic function could not be evaluated due to possible atrial flutter. Indeterminate diastolic function, due to what appears to be atrial flutter. Right Ventricle: The right ventricular size is normal. No increase in right ventricular wall thickness. Right ventricular systolic function is hyperdynamic. Left Atrium: Left atrial size was normal in size. Right Atrium: Right atrial size was normal in size. Pericardium: There is no evidence of pericardial effusion. Mitral Valve: The mitral valve is grossly normal. No evidence of mitral valve regurgitation. Tricuspid Valve: The tricuspid valve is grossly normal. Tricuspid valve regurgitation is not demonstrated. Aortic Valve: The aortic valve was not well visualized. Aortic valve regurgitation is not visualized. Mild aortic valve sclerosis is present, with no evidence of aortic valve stenosis. Pulmonic Valve: The pulmonic valve  was normal in structure. Pulmonic valve regurgitation is not visualized. Aorta: The aortic root and ascending aorta are structurally normal, with no evidence of dilitation. Venous: The inferior vena cava is normal in size with greater than 50% respiratory variability, suggesting right atrial pressure of 3 mmHg. IAS/Shunts: No atrial level shunt detected by color flow Doppler.  LEFT VENTRICLE PLAX 2D LVIDd:         4.04 cm LVIDs:         3.62 cm LV PW:         0.76 cm LV IVS:        0.77 cm LVOT diam:     1.90 cm LV SV:         48 LV SV Index:   29 LVOT Area:     2.84 cm  LV Volumes (MOD) LV vol d, MOD A2C: 71.0 ml LV vol d, MOD A4C: 68.8 ml LV vol s, MOD A2C: 49.9 ml LV vol s, MOD A4C: 60.4 ml  LV SV MOD A2C:     21.1 ml LV SV MOD A4C:     68.8 ml LV SV MOD BP:      14.9 ml RIGHT VENTRICLE TAPSE (M-mode): 2.2 cm LEFT ATRIUM             Index       RIGHT ATRIUM          Index LA diam:        2.50 cm 1.52 cm/m  RA Area:     8.27 cm LA Vol (A2C):   31.1 ml 18.94 ml/m RA Volume:   15.30 ml 9.32 ml/m LA Vol (A4C):   18.0 ml 10.96 ml/m LA Biplane Vol: 23.7 ml 14.44 ml/m  AORTIC VALVE LVOT Vmax:   113.00 cm/s LVOT Vmean:  73.900 cm/s LVOT VTI:    0.169 m  AORTA Ao Root diam: 2.90 cm  SHUNTS Systemic VTI:  0.17 m Systemic Diam: 1.90 cm Lyman Bishop MD Electronically signed by Lyman Bishop MD Signature Date/Time: 06/28/2019/3:34:15 PM    Final    [place] CORE BIOPSY (LYMPH NODES)  Result Date: 06/28/2019 INDICATION: 74 year old female with a 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 ANESTHESIA/SEDATION: None FLUOROSCOPY TIME:  None COMPLICATIONS: None PROCEDURE: Informed written consent was obtained from the patient after a thorough discussion of the procedural risks, benefits and alternatives. All questions were addressed. Maximal Sterile Barrier Technique was utilized including caps, mask, sterile gowns, sterile gloves, sterile drape, hand hygiene and skin antiseptic. A timeout was performed prior to the initiation of the procedure. Ultrasound survey was performed with images stored and sent to PACs. The left neck was prepped with chlorhexidine in a sterile fashion, and a sterile drape was applied covering the operative field. A sterile gown and sterile gloves were used for the procedure. Local anesthesia was provided with 1% Lidocaine. Ultrasound guidance was used to infiltrate the region with 1% lidocaine for local anesthesia. A small stab incision was made with 11 blade scalpel. Using ultrasound guidance, multiple 18 gauge core biopsy were acquired of the pathologic left supraclavicular node.  These were placed in day fresh specimen container. Final image was stored after biopsy. Patient tolerated the procedure well and remained hemodynamically stable throughout. No complications were encountered and no significant blood loss was encounter IMPRESSION: Status post ultrasound-guided biopsy of left supraclavicular pathologic node. Signed, Dulcy Fanny. Dellia Nims, [facility]  [facility] Radiology Electronically Signed   By: Corrie Mckusick D.O.   On: 06/28/2019 17:11     Labs:   Basic Metabolic Panel: Recent Labs  Lab 06/28/19 0028 06/28/19 0028 06/28/19 0533 06/29/19 9371 06/29/19 6967 06/30/19 8938 06/30/19 0511 07/01/19 0444 07/02/19 0415  NA 132*  --   --  126*  --  135  --  133* 133*  K 3.7   < >  --  3.2*   < > 3.4*   < > 4.2 4.5  CL 96*  --   --  97*  --  104  --  101 101  CO2 21*  --   --  19*  --  21*  --  23 22  GLUCOSE 105*  --   --  91  --  88  --  97 112*  BUN 20  --   --  13  --  9  --  8 9  CREATININE 1.11*   < > 0.92 0.76  --  0.53  --  0.66 0.59  CALCIUM 9.5  --   --  8.1*  --  8.4*  --  8.5* 8.5*  MG  --   --   --   --   --   --   --  2.5* 2.3  PHOS  --   --   --   --   --   --   --  1.3* 3.1   < > = values in this interval not displayed.   GFR Estimated Creatinine Clearance: 53.6 mL/min (by C-G formula based on SCr of 0.59 mg/dL). Liver Function Tests: Recent Labs  Lab 06/28/19 0028 06/29/19 0509 06/30/19 0511 07/01/19 0444 07/02/19 0415  AST 264* 213* 134* 197* 275*  ALT 375* 294* 217* 225* 278*  ALKPHOS 351* 313* 298* 337* 386*  BILITOT 1.0 2.1* 1.7* 2.0* 2.3*  PROT 8.9* 7.1 7.1 7.0 7.3  ALBUMIN 4.0 2.9* 3.0* 2.7* 2.9*   Recent Labs  Lab 06/28/19 0028  LIPASE 325*   No results for input(s): AMMONIA in the last 168 hours. Coagulation profile No results for input(s): INR, PROTIME in the last 168 hours.  CBC: Recent Labs  Lab 06/28/19 0028 06/28/19 0533 06/29/19 0509 06/30/19 0511  07/02/19 0415  WBC 12.3* 10.8* 11.3* 9.6 7.6  NEUTROABS 9.5*  --   --   --   --   HGB 11.9* 11.6* 10.2* 9.5* 9.9*  HCT 36.7 35.0* 31.4* 29.7* 30.7*  MCV 83.2 83.1 84.0 85.1 84.3  PLT 617* 540* 473* 486* 524*   Cardiac Enzymes: No results for input(s): CKTOTAL, CKMB, CKMBINDEX, TROPONINI in the last 168 hours. BNP: Invalid input(s): POCBNP CBG: No results for input(s): GLUCAP in the last 168 hours. D-Dimer No results for input(s): DDIMER in the last 72 hours. Hgb A1c No results for input(s): HGBA1C in the last 72 hours. Lipid Profile No results for input(s): CHOL, HDL, LDLCALC, TRIG, CHOLHDL, LDLDIRECT in the last 72 hours. Thyroid function studies No results for input(s): TSH, T4TOTAL, T3FREE, THYROIDAB in the last 72 hours.  Invalid input(s): FREET3 Anemia work up No results for input(s): VITAMINB12, FOLATE, FERRITIN, TIBC, IRON, RETICCTPCT in the last 72 hours. Microbiology Recent Results (from the past 240 hour(s))  SARS Coronavirus 2 by RT PCR (hospital order, performed in [facility] hospital lab) Nasopharyngeal Nasopharyngeal Swab     Status: None   Collection Time: 06/28/19  2:00 AM   Specimen: Nasopharyngeal Swab  Result Value  Ref Range Status   SARS Coronavirus 2 NEGATIVE NEGATIVE Final    Comment: (NOTE) SARS-CoV-2 target nucleic acids are NOT DETECTED. The SARS-CoV-2 RNA is generally detectable in upper and lower respiratory specimens during the acute phase of infection. The lowest concentration of SARS-CoV-2 viral copies this assay can detect is 250 copies / mL. A negative result does not preclude SARS-CoV-2 infection and should not be used as the sole basis for treatment or other patient management decisions.  A negative result may occur with improper specimen collection / handling, submission of specimen other than nasopharyngeal swab, presence of viral mutation(s) within the areas targeted by this assay, and inadequate number of viral copies (<250 copies /  mL). A negative result must be combined with clinical observations, patient history, and epidemiological information. Fact Sheet for Patients:   StrictlyIdeas.no Fact Sheet for Healthcare Providers: BankingDealers.co.za This test is not yet approved or cleared  by the [place] FDA and has been authorized for detection and/or diagnosis of SARS-CoV-2 by FDA under an Emergency Use Authorization (EUA).  This EUA will remain in effect (meaning this test can be used) for the duration of the COVID-19 declaration under Section 564(b)(1) of the Act, 21 U.S.C. section 360bbb-3(b)(1), unless the authorization is terminated or revoked sooner. Performed at [facility], [place] [address] [place], [place] [address]   Urine culture     Status: Abnormal   Collection Time: 06/28/19  5:43 AM   Specimen: Urine, Random  Result Value Ref Range Status   Specimen Description   Final    URINE, RANDOM Performed at [facility] [address] [place], [place] [address]    Special Requests   Final    NONE Performed at [facility], [place] [address], [place], [place] [address]    Culture >=100,000 COLONIES/mL ESCHERICHIA COLI (A)  Final   Report Status 07/02/2019 FINAL  Final     Discharge Instructions:   Discharge Instructions     Diet - low sodium heart healthy   Complete by: As directed    Discharge instructions   Complete by: As directed    Please follow-up with your primary care physician in 1 week.  Check blood work in 1 week.  Follow-up with cardiology as scheduled by the cardiology clinic.  Follow-up with Dr. Burr Medico oncology on 07/04/2019.   Increase activity slowly   Complete by: As directed       Allergies as of 07/02/2019       Reactions   Sulfa Antibiotics    Rash     Aspirin Nausea Only        Medication List     STOP taking these medications    amLODipine 10 MG  tablet Commonly known as: NORVASC   lisinopril-hydrochlorothiazide 20-25 MG tablet Commonly known as: ZESTORETIC       TAKE these medications    cloNIDine 0.2 MG tablet Commonly known as: CATAPRES Take 1 tablet (0.2 mg total) by mouth 2 (two) times daily. What changed:   medication strength  how much to take   DSS 100 MG Caps Take 100 mg by mouth in the morning and at bedtime.   gabapentin 100 MG capsule Commonly known as: NEURONTIN Take 100 mg by mouth daily.   irbesartan 150 MG tablet Commonly known as: AVAPRO Take 1 tablet (150 mg total) by mouth daily. Start taking on: July 03, 2019   metoprolol succinate 25 MG 24 hr tablet Commonly known as:  Toprol XL Take 3 tablets (75 mg total) by mouth daily.   nicotine 21 mg/24hr patch Commonly known as: NICODERM CQ - dosed in mg/24 hours Place 1 patch (21 mg total) onto the skin daily. Start taking on: July 03, 2019   oxyCODONE 5 MG immediate release tablet Commonly known as: Oxy IR/ROXICODONE Take 1 tablet (5 mg total) by mouth every 6 (six) hours as needed for up to 5 days for breakthrough pain.   oxyCODONE 10 mg 12 hr tablet Commonly known as: OXYCONTIN Take 1 tablet (10 mg total) by mouth every 12 (twelve) hours for 5 days.   senna 8.6 MG Tabs tablet Commonly known as: SENOKOT Take 1 tablet (8.6 mg total) by mouth in the morning and at bedtime.   simvastatin 10 MG tablet Commonly known as: ZOCOR Take 10 mg by mouth at bedtime.   spironolactone 25 MG tablet Commonly known as: ALDACTONE Take 0.5 tablets (12.5 mg total) by mouth daily. Start taking on: July 03, 2019               Durable Medical Equipment  (From admission, onward)           Start     Ordered   06/29/19 1102  For home use only DME 3 n 1  Once     06/29/19 1101   06/29/19 1101  For home use only DME Walker rolling  Once    Question Answer Comment  Walker: With 5 Inch Wheels   Patient needs a walker to treat with the following  condition Metastatic cancer ([place])      06/29/19 1101           Follow-up Information     Truitt Merle, MD Follow up.   Specialties: Hematology, Oncology Why: Call office to [facility] appointment for week of 6/1 to 6/4. Contact information: [place] [address] [phone]         Almyra Deforest, [place] Follow up.   Specialties: Cardiology, Radiology Why: Hospital follow-up schedule for 07/10/2019 at 9:15am with Almyra Deforest, one of our office's PAs. Please arrive 15 minutes early for check-in. If this date/time does not work for you, please call our office to reschedule.  Contact information: [address] [place] [address] [phone]           Time coordinating discharge: 39 minutes  Signed:  Azrael Maddix  [facility] 07/02/2019, 2:46 PM

## 2019-07-02 NOTE — Progress Notes (Signed)
Physical Therapy Treatment Patient Details Name: Alisha Goodman MRN: [id number] DOB: Mar 23, 1945 Today's Date: 07/02/2019    History of Present Illness Alisha Goodman is a 74 year old female with past medical history of hypertension, heavy smoker, presented with progressive of left back and leg pain, CT scan showed bulky mediastinal and right hilar adenopathy, multiple right-sided pulmonary nodules, large liver lesion, and a pancreatic mass.    PT Comments    Pt in bed with spouse and 2 children visiting.  Pt hopeful to D/C to home today.  Assisted OOB to amb in hallway.  General transfer comment: trial used Rollator with 25% VC's on proper use, tech and brakes. General Gait Details: prior pt did NOT use any AD.  Trial used a Rollator pt was able to amb a greater distance and present with no LOB.  Did c/o L lateral upper thigh pain that "started with all this" (Dx METS) with mild instability/deviation.  Pt did require one seated rest break after 40 feet.  Instructed on Rollator safety of locking brakes and placing walker up against a wall before sitting on seat.  Pt appreciative of Rollator suggestion but declines the need for one. Assisted back to bed per pt request.   Follow Up Recommendations  Home health PT;Supervision for mobility/OOB     Equipment Recommendations  3in1 (PT);Other (comment)(Rollator)  Pt declines need for any AD at this time   Recommendations for Other Services       Precautions / Restrictions Precautions Precautions: Fall Precaution Comments: L LE lateral upper thigh pain (new past couple of days) Restrictions Weight Bearing Restrictions: No Other Position/Activity Restrictions: WBAT    Mobility  Bed Mobility Overal bed mobility: Modified Independent             General bed mobility comments: increased time  Transfers Overall transfer level: Needs assistance Equipment used: None;4-wheeled walker Transfers: Sit to/from Stand;Stand Pivot Transfers Sit to  Stand: Supervision Stand pivot transfers: Supervision;Min guard       General transfer comment: trial used Rollator with 25% VC's on proper use, tech and brakes.  Ambulation/Gait Ambulation/Gait assistance: Supervision Gait Distance (Feet): 75 Feet Assistive device: 4-wheeled walker Gait Pattern/deviations: Step-through pattern     General Gait Details: prior pt did NOT use any AD.  Trial used a Rollator pt was able to amb a greater distance and present with no LOB.  Did c/o L lateral upper thigh pain that "started with all this" (Dx METS) with mild instability/deviation.  Pt did require one seated rest break after 40 feet.  Instructed on Rollator safety of locking brakes and placing walker up against a wall before sitting on seat.  Pt appreciative of Rollator suggestion but declines the need for one.   Stairs             Wheelchair Mobility    Modified Rankin (Stroke Patients Only)       Balance                                            Cognition Arousal/Alertness: Awake/alert Behavior During Therapy: WFL for tasks assessed/performed Overall Cognitive Status: Within Functional Limits for tasks assessed                                 General Comments: AxO x 3 pleasant  Exercises      General Comments        Pertinent Vitals/Pain Pain Assessment: Faces Faces Pain Scale: Hurts a little bit Pain Location: L thigh Pain Descriptors / Indicators: Grimacing;Guarding Pain Intervention(s): Monitored during session    Home Living                      Prior Function            PT Goals (current goals can now be found in the care plan section) Progress towards PT goals: Progressing toward goals    Frequency    Min 3X/week      PT Plan Current plan remains appropriate    Co-evaluation              AM-PAC PT "6 Clicks" Mobility   Outcome Measure  Help needed turning from your back to your side  while in a flat bed without using bedrails?: None Help needed moving from lying on your back to sitting on the side of a flat bed without using bedrails?: None Help needed moving to and from a bed to a chair (including a wheelchair)?: None Help needed standing up from a chair using your arms (e.g., wheelchair or bedside chair)?: A Little Help needed to walk in hospital room?: A Little Help needed climbing 3-5 steps with a railing? : A Little 6 Click Score: 21    End of Session Equipment Utilized During Treatment: Gait belt Activity Tolerance: Patient tolerated treatment well Patient left: in bed;with call bell/phone within reach;with family/visitor present Nurse Communication: Mobility status PT Visit Diagnosis: Difficulty in walking, not elsewhere classified (R26.2);Pain Pain - Right/Left: Left Pain - part of body: Leg     Time: 1047-1105 PT Time Calculation (min) (ACUTE ONLY): 18 min  Charges:  $Gait Training: 8-22 mins                     Rica Koyanagi  PTA Acute  Rehabilitation Services Pager      [phone] Office      [phone]

## 2019-07-02 NOTE — TOC Transition Note (Signed)
Transition of Care [facility]) - CM/SW Discharge Note   Patient Details  Name: KUSHI KUN MRN: [id number] Date of Birth: [birth year]  [facility] CM/SW Contact:  Dessa Phi, RN Phone Number: 07/02/2019, 3:03 PM   Clinical Narrative:  D/c home w/HHC. No further CM needsl     Final next level of care: [place] Barriers to Discharge: No Barriers Identified   Patient Goals and CMS Choice Patient states their goals for this hospitalization and ongoing recovery are:: go home CMS Medicare.gov Compare Post Acute Care list provided to:: Patient Represenative (must comment)(dtr [facility]) Choice offered to / list presented to : Adult Children  Discharge Placement                       Discharge Plan and Services   Discharge Planning Services: CM Consult Post Acute Care Choice: Home Health          DME Arranged: Other see comment(decline rw,3n1)         HH Arranged: PT [place]: [place] ([place]) Date [place]: 07/02/19 Time [place]: 1108 Representative spoke with at [place]: [place] (SDOH) Interventions     Readmission Risk Interventions No flowsheet data found.

## 2019-07-03 ENCOUNTER — Ambulatory Visit
Admission: RE | Admit: 2019-07-03 | Discharge: 2019-07-03 | Disposition: A | Discharge status: Home / Self Care | Payer: Medicare (Managed Care) | Source: Ambulatory Visit | Attending: Radiation Oncology | Admitting: Radiation Oncology

## 2019-07-03 ENCOUNTER — Encounter: Payer: Self-pay | Admitting: Radiation Oncology

## 2019-07-03 ENCOUNTER — Other Ambulatory Visit: Payer: Self-pay

## 2019-07-03 ENCOUNTER — Other Ambulatory Visit: Payer: Self-pay | Admitting: Radiation Oncology

## 2019-07-03 VITALS — BP 147/76 | HR 109 | Temp 97.8°F | Resp 20 | Ht 65.0 in | Wt 122.4 lb

## 2019-07-03 DIAGNOSIS — Z87891 Personal history of nicotine dependence: Secondary | ICD-10-CM | POA: Diagnosis not present

## 2019-07-03 DIAGNOSIS — I10 Essential (primary) hypertension: Secondary | ICD-10-CM | POA: Insufficient documentation

## 2019-07-03 DIAGNOSIS — Z510 Encounter for antineoplastic radiation therapy: Secondary | ICD-10-CM | POA: Insufficient documentation

## 2019-07-03 DIAGNOSIS — C771 Secondary and unspecified malignant neoplasm of intrathoracic lymph nodes: Secondary | ICD-10-CM

## 2019-07-03 DIAGNOSIS — C787 Secondary malignant neoplasm of liver and intrahepatic bile duct: Secondary | ICD-10-CM | POA: Diagnosis not present

## 2019-07-03 DIAGNOSIS — C778 Secondary and unspecified malignant neoplasm of lymph nodes of multiple regions: Secondary | ICD-10-CM | POA: Insufficient documentation

## 2019-07-03 DIAGNOSIS — I700 Atherosclerosis of aorta: Secondary | ICD-10-CM | POA: Diagnosis not present

## 2019-07-03 DIAGNOSIS — G893 Neoplasm related pain (acute) (chronic): Secondary | ICD-10-CM | POA: Diagnosis not present

## 2019-07-03 DIAGNOSIS — I4892 Unspecified atrial flutter: Secondary | ICD-10-CM | POA: Diagnosis not present

## 2019-07-03 DIAGNOSIS — K869 Disease of pancreas, unspecified: Secondary | ICD-10-CM | POA: Insufficient documentation

## 2019-07-03 DIAGNOSIS — C7801 Secondary malignant neoplasm of right lung: Secondary | ICD-10-CM | POA: Diagnosis not present

## 2019-07-03 DIAGNOSIS — E785 Hyperlipidemia, unspecified: Secondary | ICD-10-CM | POA: Insufficient documentation

## 2019-07-03 DIAGNOSIS — C801 Malignant (primary) neoplasm, unspecified: Secondary | ICD-10-CM | POA: Insufficient documentation

## 2019-07-03 DIAGNOSIS — E279 Disorder of adrenal gland, unspecified: Secondary | ICD-10-CM | POA: Insufficient documentation

## 2019-07-03 DIAGNOSIS — C3490 Malignant neoplasm of unspecified part of unspecified bronchus or lung: Secondary | ICD-10-CM

## 2019-07-03 DIAGNOSIS — E509 Vitamin A deficiency, unspecified: Secondary | ICD-10-CM | POA: Insufficient documentation

## 2019-07-03 NOTE — Progress Notes (Signed)
GI Location of Tumor / Histology: Primary cancer unknown- probable lung or pancreatic.  Metastatic malignancy to mediastinal and abdominal nodes,   Alisha Goodman presented with right thigh pain about a year ago and was seen by her PCP who recommended PT.  The pain resolved.  She noticed worsening left hip and thigh pain about a few weeks ago, especially with position changes and walking.  She also reports intermittent hoarseness for the past year, and a total of about 10 pounds weight loss in the past year.  CT CAP 06/28/2019: bulky mediastinal and right hilar adenopathy, multiple right sided pulmonary nodules, large liver lesion, and a pancreatic mass.  Biopsies of Supraclavicular Lymph Node 06/28/2019:  -Results Pending   Past/Anticipated interventions by surgeon, if any:   Past/Anticipated interventions by medical oncology, if any:  Dr. Burr Medico 06/28/2019 -Metastatic malignancy to mediastinal and abdominal nodes, lung, liver, and pancreatic mass.  Primary is likely lung cancer or pancreatic cancer. -Left hip and thigh pain, possible related to the left paraspinal metastatic lesion. -I recommend IR ultrasound-guided left supraclavicular lymph node biopsy.  This is palpable.  I spoke with IR Dr. 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  -If her pain is controlled in the next few days, it is okay to discharge from oncology standpoint.  I will be back to office on Wednesday, and I will follow-up the pathology results and see her back in my office next week.  If her pain is not able to well controlled, please consult rad/onc over the weekend, to consider urgent radiation. she will benefit from palliative radiation to the left paraspinal mass to improve her leg pain. Otherwise I will send a urgent referral to radiation oncology and see her as outpt next week.  -Follow-up  with Dr. Burr Medico 07/04/2019    Weight changes, if any: 5-10 pound weight loss over the past 6 months.  Bowel/Bladder complaints, if any: She has occasional constipation, she is taking stool softeners.  Nausea / Vomiting, if any: No  Pain issues, if any:  Left leg pain.    SAFETY ISSUES:  Prior radiation? No  Pacemaker/ICD? No  Possible current pregnancy? Hysterectomy  Is the patient on methotrexate? No  Current Complaints/Details:

## 2019-07-03 NOTE — Progress Notes (Signed)
Radiation Oncology         [phone] ________________________________  Name: Alisha Goodman        MRN: [id number]  Date of Service: 07/03/2019 DOB: 04/12/1945  CC:Manfredi, Brenda L, MD  Feng, Yan, MD     REFERRING PHYSICIAN: Feng, Yan, MD   DIAGNOSIS: The primary encounter diagnosis was Pain of metastatic malignancy. 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   HISTORY OF PRESENT ILLNESS: Alisha Goodman is a 73 y.o. female seen at the request of Dr. Feng for metastatic cancer, likely originating in the lung. The patient presented to the ED with progressive left leg pain and was found on diagnostic work up to have multifocal areas of malignant appearing findings including adenopathy in the right supraclavicular station, and bulky disease in the mediastinum causing mass effect on the right upper lobe bronchus, bronchus intermedius and right middle and lower lobe Bronchi, and mediastinal adenopathy causing compression of the esophagus, multiple pulmonary nodules, a large liver lesion, and subsequent biliary dilatation, multifocal abdominal and retroperitoneal adenopathy, a 4.3 cm mass in the head of the pancreas, left adrenal thickening, right adrenal mass measuring 3.2 x 2.6 cm, a subcutaneous right chest wall mass and left paraspinal intramuscular metastasis at the level of L3.     PREVIOUS RADIATION THERAPY: No   PAST MEDICAL HISTORY:  Past Medical History:  Diagnosis Date  . Aortic atherosclerosis (HCC) 06/29/2019  . Atrial flutter (HCC) 06/29/2019   possible dx  . Chronic systolic CHF (congestive heart failure) (HCC) 06/29/2019  . Emphysema of lung (HCC) 06/29/2019  . Hyperlipidemia   . Hypertension   . Lumbar radiculopathy   . Metastatic cancer (HCC) 06/28/2019  . Pain of metastatic malignancy    left paraspinal mass in lumbar area       PAST SURGICAL HISTORY: Past Surgical History:    Procedure Laterality Date  . ABDOMINAL HYSTERECTOMY       FAMILY HISTORY:  Family History  Problem Relation Age of Onset  . Breast cancer Neg Hx      SOCIAL HISTORY:  reports that she has quit smoking. Her smoking use included cigarettes. She has never used smokeless tobacco. She reports that she does not drink alcohol or use drugs. The patient is married and lives in [place]. She is accompanied by her husband and daughter.    ALLERGIES: Sulfa antibiotics and Aspirin   MEDICATIONS:  Current Outpatient Medications  Medication Sig Dispense Refill  . cloNIDine (CATAPRES) 0.2 MG tablet Take 1 tablet (0.2 mg total) by mouth 2 (two) times daily. 60 tablet 0  . Docusate Sodium (DSS) 100 MG CAPS Take 100 mg by mouth in the morning and at bedtime.     . gabapentin (NEURONTIN) 100 MG capsule Take 100 mg by mouth daily.    . irbesartan (AVAPRO) 150 MG tablet Take 1 tablet (150 mg total) by mouth daily. 30 tablet 2  . metoprolol succinate (TOPROL XL) 25 MG 24 hr tablet Take 3 tablets (75 mg total) by mouth daily. 90 tablet 1  . nicotine (NICODERM CQ - DOSED IN MG/24 HOURS) 21 mg/24hr patch Place 1 patch (21 mg total) onto the skin daily. 28 patch 0  . oxyCODONE (OXY IR/ROXICODONE) 5 MG immediate release tablet Take 1 tablet (5 mg total) by mouth every 6 (six) hours as needed for up to 5 days for breakthrough pain. 30 tablet 0  . oxyCODONE (OXYCONTIN)   15 mg 12 hr tablet Take 1 tablet (15 mg total) by mouth every 12 (twelve) hours for 5 days. 10 tablet 0  . senna (SENOKOT) 8.6 MG TABS tablet Take 1 tablet (8.6 mg total) by mouth in the morning and at bedtime. 120 tablet 0  . simvastatin (ZOCOR) 10 MG tablet Take 10 mg by mouth at bedtime.    . spironolactone (ALDACTONE) 25 MG tablet Take 0.5 tablets (12.5 mg total) by mouth daily. 30 tablet 0  . cyclobenzaprine (FLEXERIL) 10 MG tablet Take 10 mg by mouth 2 (two) times daily as needed.     No current facility-administered medications for this  encounter.     REVIEW OF SYSTEMS: On review of systems, the patient reports that she is doing okay with her pain management. Her pain is really in the left hip and upper thigh and has been progressively worse in the weeks leading up to her hospitalization. She describes hoarseness for the past year or so. She denies any shortness of breath currently. No other complaints are noted.       PHYSICAL EXAM:  Wt Readings from Last 3 Encounters:  07/03/19 122 lb 6.4 oz (55.5 kg)  07/02/19 121 lb 4.1 oz (55 kg)   Temp Readings from Last 3 Encounters:  07/03/19 97.8 F (36.6 C)  07/02/19 (!) 97.3 F (36.3 C) (Oral)   BP Readings from Last 3 Encounters:  07/03/19 (!) 147/76  07/02/19 122/68   Pulse Readings from Last 3 Encounters:  07/03/19 (!) 109  07/02/19 93     In general this is a well appearing African American female in no acute distress. She's alert and oriented x4 and appropriate throughout the examination. Cardiopulmonary assessment is negative for acute distress and she exhibits normal effort.    ECOG = 1  0 - Asymptomatic (Fully active, able to carry on all predisease activities without restriction)  1 - Symptomatic but completely ambulatory (Restricted in physically strenuous activity but ambulatory and able to carry out work of a light or sedentary nature. For example, light housework, office work)  2 - Symptomatic, <50% in bed during the day (Ambulatory and capable of all self care but unable to carry out any work activities. Up and about more than 50% of waking hours)  3 - Symptomatic, >50% in bed, but not bedbound (Capable of only limited self-care, confined to bed or chair 50% or more of waking hours)  4 - Bedbound (Completely disabled. Cannot carry on any self-care. Totally confined to bed or chair)  5 - Death   Oken MM, Creech RH, Tormey DC, et al. (1982). "Toxicity and response criteria of the Eastern Cooperative Oncology Group". Am. J. Clin. Oncol. 5 (6):  649-55    LABORATORY DATA:  Lab Results  Component Value Date   WBC 7.6 07/02/2019   HGB 9.9 (L) 07/02/2019   HCT 30.7 (L) 07/02/2019   MCV 84.3 07/02/2019   PLT 524 (H) 07/02/2019   Lab Results  Component Value Date   NA 133 (L) 07/02/2019   K 4.5 07/02/2019   CL 101 07/02/2019   CO2 22 07/02/2019   Lab Results  Component Value Date   ALT 278 (H) 07/02/2019   AST 275 (H) 07/02/2019   ALKPHOS 386 (H) 07/02/2019   BILITOT 2.3 (H) 07/02/2019      RADIOGRAPHY: DG Chest 2 View  Result Date: 06/28/2019 CLINICAL DATA:  Shortness of breath, weakness EXAM: CHEST - 2 VIEW COMPARISON:  Radiograph 06/22/2004 FINDINGS: Irregular   dense opacity is seen along the right mediastinal border and projecting over the hilum with a possible hilum overlay sign suggesting either anterior or posterior mediastinal origins. However, suspect that this process may be involving the right central airways given more peripheral wedge like opacity throughout the right lung which could reflect a postobstructive infectious or inflammatory process. Central pulmonary arteries are enlarged. Cardiac size within normal limits. No pneumothorax or visible effusion. No acute osseous or soft tissue abnormality. Telemetry leads overlie the chest. IMPRESSION: Irregular dense opacity along the right mediastinal border and projecting over the hilum with possible involvement of the airways given more peripheral opacity which could reflect a postobstructive process. Further evaluation with cross-sectional imaging is warranted. Central pulmonary arterial enlargement noted as well. May reflect pulmonary artery hypertension. Electronically Signed   By: Price  DeHay M.D.   On: 06/28/2019 01:37   CT Chest W Contrast  Result Date: 06/28/2019 CLINICAL DATA:  Mediastinal mass on x-ray. Elevated LFTs. Left back pain. EXAM: CT CHEST, ABDOMEN, AND PELVIS WITH CONTRAST TECHNIQUE: Multidetector CT imaging of the chest, abdomen and pelvis was  performed following the standard protocol during bolus administration of intravenous contrast. CONTRAST:  100mL OMNIPAQUE IOHEXOL 300 MG/ML  SOLN COMPARISON:  Chest radiograph earlier this day. Remote abdominal CT 1126 FINDINGS: CT CHEST FINDINGS Cardiovascular: Aortic atherosclerosis. Aortic tortuosity without aneurysm. Heart is normal in size. Coronary artery calcifications. No pericardial effusion. Extensive mediastinal adenopathy causes mass effect and narrowing of the right pulmonary arteries. No obvious intraluminal filling defect. Mediastinum/Nodes: Extensive mediastinal and right hilar adenopathy, with confluent nodal conglomerate extending from the subcarinal station to the level of the mid trachea. Lymph nodes causes mass effect and narrowing of the right upper lobe bronchus bronchus intermedius and both lower lobe bronchi. Representative measurement at the level of the carina of 6.8 x 5.3 cm, series 1, image 23. Subcarinal nodal conglomerate measures 3.4 x 5.2 cm, series 1, image 30. Prevascular nodal mass measuring 5.4 x 2.3 cm, series 1, image 25. Left supraclavicular adenopathy measuring 2 x 2 cm, series 1, image 5. Additional adenopathy at multiple stations. Many of these lymph nodes are partially calcified and low-density/necrotic. There is no left hilar adenopathy. There is no axillary adenopathy. Slight mass effect on the esophagus from nodal conglomerate, which is difficult to delineate in the mid distal portion. No visualized thyroid nodule. Lungs/Pleura: There are multiple right pulmonary nodules. It is difficult to delineate nodules from adenopathy in the central right lung. Adenopathy is irregular borders. Multiple branching nodules in the right upper lobe which may represent lymphangitic spread, for example series 3, image 54. There is a 10 mm medial right lower lobe pulmonary nodule, series 3, image 73. 9 mm right lower lobe nodule series 3, image 87. There is pleural based nodularity in the  posterior right upper lobe that is contiguous with adenopathy. Adenopathy causes mass effect and narrowing of the right upper lobe bronchus, bronchus intermedius and right middle and lower lobe bronchi. No significant pleural effusion. No discrete left lung pulmonary nodules.  Mild emphysema. Musculoskeletal: No blastic or destructive lytic lesions. Right chest wall metastasis, series 1, image 35. CT ABDOMEN PELVIS FINDINGS Hepatobiliary: Large irregular hypodense liver lesion in the inferior liver tip measures 6.1 x 4.3 cm. There is right greater than left intrahepatic biliary ductal dilatation, likely due to mass effect from periportal adenopathy on the common bile duct. Gallbladder is not well visualized. Pancreas: Heterogeneous mass in the pancreatic body measuring 4.3 x 2.5 cm.   There is no distal atrophy. Mild proximal ductal dilatation of 5 mm. Spleen: Calcified granuloma.  Splenule inferiorly. Adrenals/Urinary Tract: Nodular thickening of the left adrenal gland up to 11 mm. Heterogeneous right adrenal mass measures 3.3 x 2.6 cm, increased in size from prior exam. There is prominent right renal parenchymal thinning with calyceal dilatation of the upper and lower poles. Large intra caliceal stones in the lower right kidney. There is no significant perinephric edema. Urinary bladder is distended but unremarkable. Stomach/Bowel: No obvious gastric mass. No small bowel obstruction or inflammatory change. High-density material throughout the colon may be contrast or bismuth containing products. Moderate stool proximally. No obvious colonic mass. Appendix is normal. Vascular/Lymphatic: Multifocal abdominal adenopathy. Periportal adenopathy with 3 x 2.3 cm lymph node, series 1, image 66. There are additional enlarged periportal nodes. Multiple enlarged upper abdominal nodes in the retroperitoneum and peripancreatic space. Representative right retroperitoneal node measures 3.0 x 1.8 cm, series 1, image 65. There is an  enlarged central mesenteric node at 9 mm, series 1, image 75. Right retroperitoneal adenopathy causes mild mass effect on the IVC. There is a retrocrural node adjacent to the distal aorta. No pelvic adenopathy. Aortic and branch atherosclerosis. The portal vein is attenuated with mass effect from adjacent adenopathy. Reproductive: The uterus is not seen.  No obvious adnexal mass. Other: No ascites or omental thickening.  No free air. Musculoskeletal: Left paraspinal met measures 3.1 x 2.2 cm, series 168 at the level of L3-L4. No evidence of blastic or destructive lytic lesion. Scoliotic curvature of the spine. IMPRESSION: 1. 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 Site of primary malignancy is not definitively determined, however but favored to represent right lung primary. There is also pancreatic lesion which may be an alternative primary. Tissue sampling is recommended. 2. Bulky mediastinal and right hilar adenopathy, nodal conglomerate causes narrowing of the right upper lobe bronchus, right bronchus intermedius, upper and lower lobe bronchi. Multiple right-sided pulmonary nodules, including pleural based nodules posteriorly. 3. Large liver lesion suspicious for metastasis. There is biliary dilatation which is likely secondary to mass effect on the common bile duct from periportal adenopathy. 4. Multifocal abdominal adenopathy throughout the retroperitoneum, periportal region, central mesentery. 5. Pancreatic mass measuring 4.3 cm which may be metastasis or primary malignancy. 6. Right adrenal mass and left adrenal thickening. 7. Subcutaneous metastasis to the right chest wall. Left paraspinal intramuscular metastasis at the level of L3. 8. Non oncologic findings of right renal atrophy with large nonobstructing stones in the lower right kidney. Aortic Atherosclerosis (ICD10-I70.0) and Emphysema (ICD10-J43.9). Electronically Signed   By: Melanie  Sanford M.D.   On: 06/28/2019 03:46    CT ABDOMEN PELVIS W CONTRAST  Result Date: 06/28/2019 CLINICAL DATA:  Mediastinal mass on x-ray. Elevated LFTs. Left back pain. EXAM: CT CHEST, ABDOMEN, AND PELVIS WITH CONTRAST TECHNIQUE: Multidetector CT imaging of the chest, abdomen and pelvis was performed following the standard protocol during bolus administration of intravenous contrast. CONTRAST:  100mL OMNIPAQUE IOHEXOL 300 MG/ML  SOLN COMPARISON:  Chest radiograph earlier this day. Remote abdominal CT 1126 FINDINGS: CT CHEST FINDINGS Cardiovascular: Aortic atherosclerosis. Aortic tortuosity without aneurysm. Heart is normal in size. Coronary artery calcifications. No pericardial effusion. Extensive mediastinal adenopathy causes mass effect and narrowing of the right pulmonary arteries. No obvious intraluminal filling defect. Mediastinum/Nodes: Extensive mediastinal and right hilar adenopathy, with confluent nodal conglomerate extending from the subcarinal station to the level of the mid trachea. Lymph nodes causes mass effect and narrowing of   the right upper lobe bronchus bronchus intermedius and both lower lobe bronchi. Representative measurement at the level of the carina of 6.8 x 5.3 cm, series 1, image 23. Subcarinal nodal conglomerate measures 3.4 x 5.2 cm, series 1, image 30. Prevascular nodal mass measuring 5.4 x 2.3 cm, series 1, image 25. Left supraclavicular adenopathy measuring 2 x 2 cm, series 1, image 5. Additional adenopathy at multiple stations. Many of these lymph nodes are partially calcified and low-density/necrotic. There is no left hilar adenopathy. There is no axillary adenopathy. Slight mass effect on the esophagus from nodal conglomerate, which is difficult to delineate in the mid distal portion. No visualized thyroid nodule. Lungs/Pleura: There are multiple right pulmonary nodules. It is difficult to delineate nodules from adenopathy in the central right lung. Adenopathy is irregular borders. Multiple branching nodules in the  right upper lobe which may represent lymphangitic spread, for example series 3, image 54. There is a 10 mm medial right lower lobe pulmonary nodule, series 3, image 73. 9 mm right lower lobe nodule series 3, image 87. There is pleural based nodularity in the posterior right upper lobe that is contiguous with adenopathy. Adenopathy causes mass effect and narrowing of the right upper lobe bronchus, bronchus intermedius and right middle and lower lobe bronchi. No significant pleural effusion. No discrete left lung pulmonary nodules.  Mild emphysema. Musculoskeletal: No blastic or destructive lytic lesions. Right chest wall metastasis, series 1, image 35. CT ABDOMEN PELVIS FINDINGS Hepatobiliary: Large irregular hypodense liver lesion in the inferior liver tip measures 6.1 x 4.3 cm. There is right greater than left intrahepatic biliary ductal dilatation, likely due to mass effect from periportal adenopathy on the common bile duct. Gallbladder is not well visualized. Pancreas: Heterogeneous mass in the pancreatic body measuring 4.3 x 2.5 cm. There is no distal atrophy. Mild proximal ductal dilatation of 5 mm. Spleen: Calcified granuloma.  Splenule inferiorly. Adrenals/Urinary Tract: Nodular thickening of the left adrenal gland up to 11 mm. Heterogeneous right adrenal mass measures 3.3 x 2.6 cm, increased in size from prior exam. There is prominent right renal parenchymal thinning with calyceal dilatation of the upper and lower poles. Large intra caliceal stones in the lower right kidney. There is no significant perinephric edema. Urinary bladder is distended but unremarkable. Stomach/Bowel: No obvious gastric mass. No small bowel obstruction or inflammatory change. High-density material throughout the colon may be contrast or bismuth containing products. Moderate stool proximally. No obvious colonic mass. Appendix is normal. Vascular/Lymphatic: Multifocal abdominal adenopathy. Periportal adenopathy with 3 x 2.3 cm lymph  node, series 1, image 66. There are additional enlarged periportal nodes. Multiple enlarged upper abdominal nodes in the retroperitoneum and peripancreatic space. Representative right retroperitoneal node measures 3.0 x 1.8 cm, series 1, image 65. There is an enlarged central mesenteric node at 9 mm, series 1, image 75. Right retroperitoneal adenopathy causes mild mass effect on the IVC. There is a retrocrural node adjacent to the distal aorta. No pelvic adenopathy. Aortic and branch atherosclerosis. The portal vein is attenuated with mass effect from adjacent adenopathy. Reproductive: The uterus is not seen.  No obvious adnexal mass. Other: No ascites or omental thickening.  No free air. Musculoskeletal: Left paraspinal met measures 3.1 x 2.2 cm, series 168 at the level of L3-L4. No evidence of blastic or destructive lytic lesion. Scoliotic curvature of the spine. IMPRESSION: 1. 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 Site of primary malignancy is not definitively determined, however but favored to represent right  lung primary. There is also pancreatic lesion which may be an alternative primary. Tissue sampling is recommended. 2. Bulky mediastinal and right hilar adenopathy, nodal conglomerate causes narrowing of the right upper lobe bronchus, right bronchus intermedius, upper and lower lobe bronchi. Multiple right-sided pulmonary nodules, including pleural based nodules posteriorly. 3. Large liver lesion suspicious for metastasis. There is biliary dilatation which is likely secondary to mass effect on the common bile duct from periportal adenopathy. 4. Multifocal abdominal adenopathy throughout the retroperitoneum, periportal region, central mesentery. 5. Pancreatic mass measuring 4.3 cm which may be metastasis or primary malignancy. 6. Right adrenal mass and left adrenal thickening. 7. Subcutaneous metastasis to the right chest wall. Left paraspinal intramuscular metastasis at the  level of L3. 8. Non oncologic findings of right renal atrophy with large nonobstructing stones in the lower right kidney. Aortic Atherosclerosis (ICD10-I70.0) and Emphysema (ICD10-J43.9). Electronically Signed   By: Melanie  Sanford M.D.   On: 06/28/2019 03:46   ECHOCARDIOGRAM COMPLETE  Result Date: 06/28/2019    ECHOCARDIOGRAM REPORT   Patient Name:   Alisha Goodman Date of Exam: 06/28/2019 Medical Rec #:  [id number]       Height:       65.0 in Accession #:    [id number]      Weight:       129.0 lb Date of Birth:  08/31/1949        BSA:          1.642 m Patient Age:    74 years        BP:           133/81 mmHg Patient Gender: F               HR:           115 bpm. Exam Location:  Inpatient Procedure: 2D Echo, Cardiac Doppler, Color Doppler and Intracardiac            Opacification Agent Indications:    Chest Pain 786.50 / R07.9  History:        Patient has no prior history of Echocardiogram examinations.                 Risk Factors:Former Smoker.  Sonographer:    Julia Swaim RDCS Referring Phys: [id number] SARA-MAIZ A THOMAS  Sonographer Comments: Suboptimal parasternal window. IMPRESSIONS  1. No apical thrombus by Definity contrast. Left ventricular ejection fraction, by estimation, is 20 to 25%. The left ventricle has severely decreased function. The left ventricle demonstrates global hypokinesis. The left ventricular internal cavity size was mildly dilated. indeterminate diastolic function, due to what appears to be atrial flutter.  2. Right ventricular systolic function is hyperdynamic. The right ventricular size is normal.  3. The mitral valve is grossly normal. No evidence of mitral valve regurgitation.  4. The aortic valve was not well visualized. Aortic valve regurgitation is not visualized. Mild aortic valve sclerosis is present, with no evidence of aortic valve stenosis.  5. The inferior vena cava is normal in size with greater than 50% respiratory variability, suggesting right atrial pressure of 3 mmHg.  Conclusion(s)/Recommendation(s): Rhythm appears to be atrial flutter, I have also reviewed the EKG which demonstrates flutter waves. FINDINGS  Left Ventricle: No apical thrombus by Definity contrast. Left ventricular ejection fraction, by estimation, is 20 to 25%. The left ventricle has severely decreased function. The left ventricle demonstrates global hypokinesis. Definity contrast agent was  given IV to delineate the left ventricular endocardial borders. The left   ventricular internal cavity size was mildly dilated. There is no left ventricular hypertrophy. Left ventricular diastolic function could not be evaluated due to possible atrial flutter. Indeterminate diastolic function, due to what appears to be atrial flutter. Right Ventricle: The right ventricular size is normal. No increase in right ventricular wall thickness. Right ventricular systolic function is hyperdynamic. Left Atrium: Left atrial size was normal in size. Right Atrium: Right atrial size was normal in size. Pericardium: There is no evidence of pericardial effusion. Mitral Valve: The mitral valve is grossly normal. No evidence of mitral valve regurgitation. Tricuspid Valve: The tricuspid valve is grossly normal. Tricuspid valve regurgitation is not demonstrated. Aortic Valve: The aortic valve was not well visualized. Aortic valve regurgitation is not visualized. Mild aortic valve sclerosis is present, with no evidence of aortic valve stenosis. Pulmonic Valve: The pulmonic valve was normal in structure. Pulmonic valve regurgitation is not visualized. Aorta: The aortic root and ascending aorta are structurally normal, with no evidence of dilitation. Venous: The inferior vena cava is normal in size with greater than 50% respiratory variability, suggesting right atrial pressure of 3 mmHg. IAS/Shunts: No atrial level shunt detected by color flow Doppler.  LEFT VENTRICLE PLAX 2D LVIDd:         4.04 cm LVIDs:         3.62 cm LV PW:         0.76 cm LV  IVS:        0.77 cm LVOT diam:     1.90 cm LV SV:         48 LV SV Index:   29 LVOT Area:     2.84 cm  LV Volumes (MOD) LV vol d, MOD A2C: 71.0 ml LV vol d, MOD A4C: 68.8 ml LV vol s, MOD A2C: 49.9 ml LV vol s, MOD A4C: 60.4 ml LV SV MOD A2C:     21.1 ml LV SV MOD A4C:     68.8 ml LV SV MOD BP:      14.9 ml RIGHT VENTRICLE TAPSE (M-mode): 2.2 cm LEFT ATRIUM             Index       RIGHT ATRIUM          Index LA diam:        2.50 cm 1.52 cm/m  RA Area:     8.27 cm LA Vol (A2C):   31.1 ml 18.94 ml/m RA Volume:   15.30 ml 9.32 ml/m LA Vol (A4C):   18.0 ml 10.96 ml/m LA Biplane Vol: 23.7 ml 14.44 ml/m  AORTIC VALVE LVOT Vmax:   113.00 cm/s LVOT Vmean:  73.900 cm/s LVOT VTI:    0.169 m  AORTA Ao Root diam: 2.90 cm  SHUNTS Systemic VTI:  0.17 m Systemic Diam: 1.90 cm Lyman Bishop MD Electronically signed by Lyman Bishop MD Signature Date/Time: 06/28/2019/3:34:15 PM    Final    [place] CORE BIOPSY (LYMPH NODES)  Result Date: 06/28/2019 INDICATION: 74 year old female with a 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 ANESTHESIA/SEDATION: None FLUOROSCOPY TIME:  None COMPLICATIONS: None PROCEDURE: Informed written consent was obtained from the patient after a thorough discussion of the procedural risks, benefits and alternatives. All questions were addressed. Maximal Sterile Barrier Technique was utilized including caps, mask, sterile gowns, sterile gloves, sterile drape, hand hygiene and skin antiseptic. A timeout was performed prior to the initiation of the procedure. Ultrasound survey  was performed with images stored and sent to PACs. The left neck was prepped with chlorhexidine in a sterile fashion, and a sterile drape was applied covering the operative field. A sterile gown and sterile gloves were used for the procedure. Local anesthesia was provided with 1% Lidocaine. Ultrasound guidance was used to  infiltrate the region with 1% lidocaine for local anesthesia. A small stab incision was made with 11 blade scalpel. Using ultrasound guidance, multiple 18 gauge core biopsy were acquired of the pathologic left supraclavicular node. These were placed in day fresh specimen container. Final image was stored after biopsy. Patient tolerated the procedure well and remained hemodynamically stable throughout. No complications were encountered and no significant blood loss was encounter IMPRESSION: Status post ultrasound-guided biopsy of left supraclavicular pathologic node. Signed, Dulcy Fanny. Dellia Nims, RPVI [facility] Radiology Electronically Signed   By: Corrie Mckusick D.O.   On: 06/28/2019 17:11       IMPRESSION/PLAN: 1. 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 Dr. Lisbeth Renshaw discusses the pathology findings and reviews the nature of small cell lung cancer. He reviews her imaging to date and would recommend a palliative course of radiotherapy to the chest including the mediastinum, as well as the L3-4 region including the paraspinous disease seen on her CT imaging. He discusses the rationale for PET scan and MRI of the brain which we will obtain as well. We discussed the risks, benefits, short, and long term effects of radiotherapy, and the patient is interested in proceeding. Dr. Lisbeth Renshaw discusses the delivery and logistics of radiotherapy and anticipates a course of 2-3 weeks of radiotherapy depending on her chemo start time, leaning towards 2 weeks for now. Written consent is obtained and placed in the chart, a copy was provided to the patient. She will simulate today, and begin radiotherapy on 07/08/19. We anticipate her treatment would be completed on 07/19/19 unless Dr. Burr Medico needed [place] to give treatment over 3 weeks to allow for ongoing chemotherapy. 2 Plans for cardiac heart monitor. During her recent hospitalization when she  was being worked up for her leg pain, she was seen by cardiology and diagnosed with new onset CHF, and possible Aflutter. Plans were to proceed with definitive cardiac monitor as an outpatient. In order to administer radiotherapy and not have interference from a device, I've reached out to cardiology to see if they are willing to let us complete radiotherapy prior to proceeding with the heart monitor.  In a visit lasting 60 minutes, greater than 50% of the time was spent face to face discussing the patient's condition, in preparation for the discussion, and coordinating the patient's care.    The above documentation reflects my direct findings during this shared patient visit. Please see the separate note by Dr. Lisbeth Renshaw on this date for the remainder of the patient's plan of care.    Carola Rhine, PAC

## 2019-07-04 ENCOUNTER — Other Ambulatory Visit: Payer: Self-pay

## 2019-07-04 ENCOUNTER — Inpatient Hospital Stay: Payer: Medicare (Managed Care)

## 2019-07-04 ENCOUNTER — Other Ambulatory Visit: Payer: Self-pay | Admitting: *Deleted

## 2019-07-04 ENCOUNTER — Encounter: Payer: Self-pay | Admitting: General Practice

## 2019-07-04 ENCOUNTER — Encounter: Payer: Self-pay | Admitting: Nurse Practitioner

## 2019-07-04 ENCOUNTER — Inpatient Hospital Stay: Payer: Medicare (Managed Care) | Attending: Nurse Practitioner | Admitting: Nurse Practitioner

## 2019-07-04 VITALS — BP 134/73 | HR 101 | Temp 97.9°F | Resp 18 | Ht 65.0 in | Wt 120.3 lb

## 2019-07-04 DIAGNOSIS — K831 Obstruction of bile duct: Secondary | ICD-10-CM | POA: Diagnosis not present

## 2019-07-04 DIAGNOSIS — J439 Emphysema, unspecified: Secondary | ICD-10-CM | POA: Insufficient documentation

## 2019-07-04 DIAGNOSIS — Z886 Allergy status to analgesic agent status: Secondary | ICD-10-CM | POA: Insufficient documentation

## 2019-07-04 DIAGNOSIS — C3481 Malignant neoplasm of overlapping sites of right bronchus and lung: Secondary | ICD-10-CM

## 2019-07-04 DIAGNOSIS — M79652 Pain in left thigh: Secondary | ICD-10-CM | POA: Insufficient documentation

## 2019-07-04 DIAGNOSIS — Z79899 Other long term (current) drug therapy: Secondary | ICD-10-CM | POA: Insufficient documentation

## 2019-07-04 DIAGNOSIS — C7A8 Other malignant neuroendocrine tumors: Secondary | ICD-10-CM | POA: Diagnosis not present

## 2019-07-04 DIAGNOSIS — R634 Abnormal weight loss: Secondary | ICD-10-CM | POA: Insufficient documentation

## 2019-07-04 DIAGNOSIS — E279 Disorder of adrenal gland, unspecified: Secondary | ICD-10-CM | POA: Diagnosis not present

## 2019-07-04 DIAGNOSIS — E871 Hypo-osmolality and hyponatremia: Secondary | ICD-10-CM | POA: Insufficient documentation

## 2019-07-04 DIAGNOSIS — K869 Disease of pancreas, unspecified: Secondary | ICD-10-CM | POA: Insufficient documentation

## 2019-07-04 DIAGNOSIS — R2242 Localized swelling, mass and lump, left lower limb: Secondary | ICD-10-CM | POA: Diagnosis not present

## 2019-07-04 DIAGNOSIS — F419 Anxiety disorder, unspecified: Secondary | ICD-10-CM | POA: Diagnosis not present

## 2019-07-04 DIAGNOSIS — N200 Calculus of kidney: Secondary | ICD-10-CM | POA: Diagnosis not present

## 2019-07-04 DIAGNOSIS — Z66 Do not resuscitate: Secondary | ICD-10-CM | POA: Insufficient documentation

## 2019-07-04 DIAGNOSIS — C7A1 Malignant poorly differentiated neuroendocrine tumors: Secondary | ICD-10-CM | POA: Diagnosis present

## 2019-07-04 DIAGNOSIS — K7290 Hepatic failure, unspecified without coma: Secondary | ICD-10-CM | POA: Diagnosis not present

## 2019-07-04 DIAGNOSIS — I5022 Chronic systolic (congestive) heart failure: Secondary | ICD-10-CM | POA: Insufficient documentation

## 2019-07-04 DIAGNOSIS — I700 Atherosclerosis of aorta: Secondary | ICD-10-CM | POA: Insufficient documentation

## 2019-07-04 DIAGNOSIS — C7B8 Other secondary neuroendocrine tumors: Secondary | ICD-10-CM | POA: Diagnosis not present

## 2019-07-04 DIAGNOSIS — M25559 Pain in unspecified hip: Secondary | ICD-10-CM | POA: Insufficient documentation

## 2019-07-04 DIAGNOSIS — Z882 Allergy status to sulfonamides status: Secondary | ICD-10-CM | POA: Diagnosis not present

## 2019-07-04 LAB — CMP (CANCER CENTER ONLY)
ALT: 871 U/L (ref 0–44)
AST: 802 U/L (ref 15–41)
Albumin: 2.9 g/dL — ABNORMAL LOW (ref 3.5–5.0)
Alkaline Phosphatase: 602 U/L — ABNORMAL HIGH (ref 38–126)
Anion gap: 11 (ref 5–15)
BUN: 9 mg/dL (ref 8–23)
CO2: 19 mmol/L — ABNORMAL LOW (ref 22–32)
Calcium: 9.8 mg/dL (ref 8.9–10.3)
Chloride: 97 mmol/L — ABNORMAL LOW (ref 98–111)
Creatinine: 0.82 mg/dL (ref 0.44–1.00)
GFR, Est AFR Am: >60 mL/min (ref >60)
GFR, Estimated: >60 mL/min (ref >60)
Glucose, Bld: 156 mg/dL — ABNORMAL HIGH (ref 70–99)
Potassium: 4.7 mmol/L (ref 3.5–5.1)
Sodium: 127 mmol/L — ABNORMAL LOW (ref 135–145)
Total Bilirubin: 5.2 mg/dL (ref 0.3–1.2)
Total Protein: 8.3 g/dL — ABNORMAL HIGH (ref 6.5–8.1)

## 2019-07-04 LAB — CBC WITH DIFFERENTIAL (CANCER CENTER ONLY)
Abs Immature Granulocytes: 0.11 10*3/uL — ABNORMAL HIGH (ref 0.00–0.07)
Basophils Absolute: 0 10*3/uL (ref 0.0–0.1)
Basophils Relative: 0 %
Eosinophils Absolute: 0 10*3/uL (ref 0.0–0.5)
Eosinophils Relative: 0 %
HCT: 31.1 % — ABNORMAL LOW (ref 36.0–46.0)
Hemoglobin: 10.3 g/dL — ABNORMAL LOW (ref 12.0–15.0)
Immature Granulocytes: 1 %
Lymphocytes Relative: 15 %
Lymphs Abs: 1.5 10*3/uL (ref 0.7–4.0)
MCH: 26.6 pg (ref 26.0–34.0)
MCHC: 33.1 g/dL (ref 30.0–36.0)
MCV: 80.4 fL (ref 80.0–100.0)
Monocytes Absolute: 0.9 10*3/uL (ref 0.1–1.0)
Monocytes Relative: 8 %
Neutro Abs: 7.9 10*3/uL — ABNORMAL HIGH (ref 1.7–7.7)
Neutrophils Relative %: 76 %
Platelet Count: 585 10*3/uL — ABNORMAL HIGH (ref 150–400)
RBC: 3.87 MIL/uL (ref 3.87–5.11)
RDW: 15.5 % (ref 11.5–15.5)
WBC Count: 10.4 10*3/uL (ref 4.0–10.5)
nRBC: 0 % (ref 0.0–0.2)

## 2019-07-04 LAB — SURGICAL PATHOLOGY

## 2019-07-04 MED ORDER — SODIUM CHLORIDE 0.9 % IV SOLN
Freq: Once | INTRAVENOUS | Status: AC
  Filled 2019-07-04: qty 250

## 2019-07-04 MED ORDER — ALPRAZOLAM 0.25 MG PO TABS: 0.2500 mg | ORAL_TABLET | Freq: Every evening | ORAL | 0 refills | Status: AC | PRN

## 2019-07-04 MED ORDER — OXYCODONE HCL 5 MG PO TABS: 5.0000 mg | ORAL_TABLET | Freq: Four times a day (QID) | ORAL | 0 refills | Status: AC | PRN

## 2019-07-04 MED ORDER — OXYCODONE HCL ER 15 MG PO T12A: 15.0000 mg | EXTENDED_RELEASE_TABLET | Freq: Two times a day (BID) | ORAL | 0 refills | Status: AC

## 2019-07-04 MED ORDER — MIRTAZAPINE 7.5 MG PO TABS
7.5000 mg | ORAL_TABLET | Freq: Every day | ORAL | 0 refills | Status: DC
Start: 2019-07-04 — End: 2019-07-30

## 2019-07-04 NOTE — Progress Notes (Signed)
ambu

## 2019-07-04 NOTE — Progress Notes (Signed)
Met with patient, her husband and daughter Denman George today at their initial medical oncology appointment with Cira Rue NP and Dr. Burr Medico s/p hospitalization.  I explained my role as GI nurse navigator.  They were given my business cards with my direct phone number and encouraged to call with any questions or concerns as they arise.  Nutritionally she is not doing well, according to her husband eating very little. She does like vanilla Ensure and tries to drink one or two a day.  She has been constipated and has been using Senokot. I suggested adding in Miralax as well to her daily routine.  I obtained a case of vanilla Ensure from nutrition and this was given to the patient.  I will also make a referral for our nutritionist to reach out to them.  This has been done.

## 2019-07-04 NOTE — Progress Notes (Addendum)
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 She is Goodman better than being in the hospital. Her left leg pain from knee to hip is controlled on oxycontin BID and breakthrough Oxy IR which she takes usually once per day but has not taken any today. Pain is 3-4 out of 10 now. She can walk short distances before pain and exertional dyspnea set in. She has intermittent cough with clear/white phlegm. No hemoptysis. Denies chest pain. No fever/chills. She is resting more than half of the day. Her appetite is poor but tolerating soft foods, eats maybe a 1/4 of what she used to but always "ate like a bird" her husband. She drinks supplements as available. Urine is dark. She is interested in appetite stimulant. Denies n/v/d. She has not had BM in 2 days, uses senakot.     MEDICAL HISTORY:  Past Medical History:  Diagnosis Date  . Aortic atherosclerosis ([place]) 06/29/2019  . Atrial flutter ([place]) 06/29/2019   possible dx  . Chronic systolic CHF (congestive heart failure) ([place]) 06/29/2019  . Emphysema of lung ([place]) 06/29/2019  . Hyperlipidemia   . Hypertension   . Lumbar radiculopathy   . Metastatic cancer ([place]) 06/28/2019  . Pain of metastatic malignancy    left paraspinal mass in lumbar area    SURGICAL HISTORY: Past Surgical History:  Procedure  Laterality Date  . ABDOMINAL HYSTERECTOMY      I have reviewed the social history and family history with the patient and they are unchanged from previous note.  ALLERGIES:  is allergic to sulfa antibiotics and aspirin.  MEDICATIONS:  Current Outpatient Medications  Medication Sig Dispense Refill  . cloNIDine (CATAPRES) 0.2 MG tablet Take 1 tablet (0.2 mg total) by mouth 2 (two) times daily. 60 tablet 0  . Docusate Sodium (DSS) 100 MG CAPS Take 100 mg by mouth in the morning and at bedtime.     . gabapentin (NEURONTIN) 100 MG capsule Take 100 mg by mouth daily.    . irbesartan (AVAPRO) 150 MG tablet Take 1 tablet (150 mg total) by mouth daily. 30 tablet 2  . metoprolol succinate (TOPROL XL) 25 MG 24 hr tablet Take 3 tablets (75 mg total) by mouth daily. 90 tablet 1  . nicotine (NICODERM CQ - DOSED IN MG/24 HOURS) 21 mg/24hr patch Place 1 patch (21 mg total) onto the skin daily. 28 patch 0  . oxyCODONE (OXY IR/ROXICODONE) 5 MG immediate release tablet Take 1 tablet (5 mg total) by mouth every 6 (six) hours as needed for breakthrough pain. 45 tablet 0  . oxyCODONE (OXYCONTIN) 15 mg 12 hr tablet Take 1 tablet (15 mg total) by mouth every 12 (twelve) hours. 60 tablet 0  . simvastatin (ZOCOR) 10 MG tablet Take 10 mg by mouth at bedtime.    Marland Kitchen spironolactone (ALDACTONE) 25 MG tablet Take 0.5 tablets (12.5 mg total) by mouth daily. 30 tablet 0  .  ALPRAZolam (XANAX) 0.25 MG tablet Take 1 tablet (0.25 mg total) by mouth at bedtime as needed for anxiety. 30 tablet 0  . cyclobenzaprine (FLEXERIL) 10 MG tablet Take 10 mg by mouth 2 (two) times daily as needed.    . mirtazapine (REMERON) 7.5 MG tablet Take 1 tablet (7.5 mg total) by mouth at bedtime. 30 tablet 0  . senna (SENOKOT) 8.6 MG TABS tablet Take 1 tablet (8.6 mg total) by mouth in the morning and at bedtime. (Patient not taking: Reported on 07/04/2019) 120 tablet 0   No current facility-administered medications for this visit.    PHYSICAL  EXAMINATION: ECOG PERFORMANCE STATUS: 3 - Symptomatic, >50% confined to bed  Vitals:   07/04/19 1515  BP: 134/73  Pulse: (!) 101  Resp: 18  Temp: 97.9 F (36.6 C)  SpO2: 100%   Filed Weights   07/04/19 1515  Weight: 120 lb 4.8 oz (54.6 kg)    GENERAL:alert, no distress and comfortable SKIN: no rash EYES: scleral icterus LYMPH:  Palpable left supraclavicular nodes  LUNGS: clear with normal breathing effort HEART: regular rate & rhythm, no lower extremity edema ABDOMEN: abdomen soft, non-tender, positive bowel sounds. Palpable liver mass  NEURO: alert & oriented x 3 with fluent speech, generalized weakness   LABORATORY DATA:  I have reviewed the data as listed CBC Latest Ref Rng & Units 07/04/2019 07/02/2019 06/30/2019  WBC 4.0 - 10.5 K/uL 10.4 7.6 9.6  Hemoglobin 12.0 - 15.0 g/dL 10.3(L) 9.9(L) 9.5(L)  Hematocrit 36.0 - 46.0 % 31.1(L) 30.7(L) 29.7(L)  Platelets 150 - 400 K/uL 585(H) 524(H) 486(H)     CMP Latest Ref Rng & Units 07/04/2019 07/02/2019 07/01/2019  Glucose 70 - 99 mg/dL 156(H) 112(H) 97  BUN 8 - 23 mg/dL 9 9 8   Creatinine 0.44 - 1.00 mg/dL 0.82 0.59 0.66  Sodium 135 - 145 mmol/L 127(L) 133(L) 133(L)  Potassium 3.5 - 5.1 mmol/L 4.7 4.5 4.2  Chloride 98 - 111 mmol/L 97(L) 101 101  CO2 22 - 32 mmol/L 19(L) 22 23  Calcium 8.9 - 10.3 mg/dL 9.8 8.5(L) 8.5(L)  Total Protein 6.5 - 8.1 g/dL 8.3(H) 7.3 7.0  Total Bilirubin 0.3 - 1.2 mg/dL 5.2(HH) 2.3(H) 2.0(H)  Alkaline Phos 38 - 126 U/L 602(H) 386(H) 337(H)  AST 15 - 41 U/L 802(HH) 275(H) 197(H)  ALT 0 - 44 U/L 871(HH) 278(H) 225(H)      RADIOGRAPHIC STUDIES: I have personally reviewed the radiological images as listed and agreed with the findings in the report. No results found.   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  -She underwent left Dukes LN biopsy which revealed metastatic high grade neuroendocrine carcinoma felt to arise from the pancreas. By [facility] cells are positive for cytokeratin-7, chromogranin, synaptoficin and TTF-1 but negative for cytokeratin 20. The proliferative rate by Ki-67 is 80% which makes this poorly differentiated.  -We reviewed the diagnosis and discussed the typical nature of this aggressive cancer which behaves similarly to small cell cancer. -She will begin palliative radiation to the lung mass and paraspinal met felt to be causing her left leg/hip pain on 07/08/19 by Dr. Lisbeth Renshaw  -We discussed that given the advanced and aggressive nature of her disease, she is not a candidate for surgery and therefore this is not curable, but still  treatable.  -We discussed standard first line chemo which would include Carboplatin on day 1 and Etoposide on days 1-3, with GCSF day 4, q21 days. We discussed expected side effects including but not limited to fatigue, nausea, vomiting, diarrhea, hair loss, neuropathy, fluid retention, renal and kidney dysfunction, neutropenic fever, need for blood transfusion, bleeding in great detail. She is interested in treatment if she is eligible.  -Ms. Goranson appears frail and weak. She has low PS and poor nutrition/hydration. Labs today concerning for rapid liver failure, Tbili increased from 2.3 to 5.2 in only 2 days. Due to her overall poor condition, unfortunately she is not a candidate for systemic chemo at this time.  -She is being referred for abd [place] to evaluate her liver, to see if she is a candidate for stenting to improve her liver function. Will f/u on the results.  -She will receive IV hydration today.  -we plan to discuss possibly shortening her course of palliative radiation so that we can start systemic chemotherapy sooner if her condition improves. I prescribed mirtazapine to help her appetite, sleep, and mood.  -We  briefly discussed palliative care home service and hospice care, which made her very anxious. I have prescribed low dose xanax PRN for anxiety.  -She has PT to begin at home next week, which I agree with for now.  -We plan to repeat lab and see her back next week in close f/u to monitor her condition and discuss further plan of care.   2.Left hip and thigh pain, likely related to the left paraspinal metastatic lesion -Pending palliative radiation per Dr. Lisbeth Renshaw to start 6/7 -pain controlled on oxycontin q12 BID and oxyIR q6 PRN, she takes it usually once per day   3. Juandice, secondary to liver metastasis  -Tbili increased from 3.2 on 6/1 to 5.2 today on 6/3 -She has rapidly progressing liver failure, unfortunately this appears to be a very aggressive disease   4. Weight loss, low performance status, secondary to #1 -She was encouraged to start supplements, given a box today  -I prescribed mirtazapine for appetite, sleep, and mood -To begin home PT next week   5. Goals of care  -She has very close, supportive family who wish to protect her from upsetting news, but we all agree she should know the gravity of her condition. -Appreciate SW involvement   -She understands her cancer is not curable, but treatable if her condition improves and she becomes candidate for chemotherapy.  -We did not discuss code status at today's visit, full code as of 06/28/19 while in the hospital  -Briefly discussed palliative care vs hospice, she is not ready to discuss hospice now  6. Hyponatremia  -Due to poor nutrition and hydration -Given IVF today and encouraged to increase fluids at home   PLAN: -Labs, path reviewed  -IVF today -ABD [place] on 07/05/19, will f/u on results  -Begin palliative RT per Dr. Lisbeth Renshaw on 07/08/19  -Lab and f/u next week  -Continue PT and nutrition support at home -Rx: mirtazapine, xanax; refill oxycontin and oxycodone   No problem-specific Assessment & Plan notes found for this  encounter.   Orders Placed This Encounter  Procedures  . US Abdomen Complete    Standing Status:   Future    Standing Expiration Date:   07/03/2020    Order Specific Question:   Reason for Exam (SYMPTOM  OR DIAGNOSIS REQUIRED)    Answer:   hyperbilirubinemia, evaluate ductal obstruction    Order Specific  Question:   Preferred imaging location?    Answer:   [facility]   All questions were answered. The patient knows to call the clinic with any problems, questions or concerns. No barriers to learning were detected.     Alisha Feeling, NP 07/04/19   Addendum  I have seen the patient, examined her. I agree with the assessment and and plan and have edited the notes.   Alisha Goodman has deteriorated quickly as I saw her in the hospital last week.  She is very fatigued, with poor appetite and oral intake, PS 3, and lab today reviewed significant worsening liver function, especially hyperbilirubinemia.  I reviewed her biopsy results in details.  Unfortunately she has poorly differentiated high-grade neuroendocrine tumor in pancreas, with diffuse metastasis to thoracic and abdominal adenopathy, liver and spinal bone metastasis.  We discussed in very aggressive nature of her malignancy, and treatment option, which is systemic chemotherapy, such as carboplatin and etoposide as first-line treatment.  However given her poor performance status, significant liver dysfunction, I think she is a poor candidate for chemo.  She is going to start palliative radiation tomorrow.  I think her hyperbilirubinemia is likely related to liver metastasis and possible obstruction from pancreatic lesions and portal adenopathy. I will get a abdominal US tomorrow to see if she is a candidate for biliary stent placement.  Patient's daughter had many questions, and the requested to talk to me outside the room.  She specifically asked about her timeline, which is probably weeks to a few months, to the best of my  knowledge.   We discussed symptom management.  I plan to see her back next week for close follow-up.  Truitt Merle  07/04/2019

## 2019-07-04 NOTE — Progress Notes (Signed)
[place] Initial Psychosocial Assessment Clinical Social Work  Clinical Social Work contacted by phone to assess psychosocial, emotional, mental health, and spiritual needs of the patient.   Barriers to care/review of distress screen:  - Transportation:  Do you anticipate any problems getting to appointments?  Do you have someone who can help run errands for you if you need it?  Husband and daughter will drive her to appointments.   - Help at home:  What is your living situation (alone, family, other)?  If you are physically unable to care for yourself, who would you call on to help you?  Lives w husband, he is in good health and able to help as needed.   - Support system:  What does your support system look like?  Who would you call on if you needed some kind of practical help?  What if you needed someone to talk to for emotional support?   Family, three adult children.  "I just deal w my family."   Was referred to [facility] for PT, has not "told them when to start bc I am going to the doctor and different places."   Spoke w daughter - "we are still in the stage of processing, we are still stunned, it came out of left field."  Family is trying to fully understand "what we are dealing with" in terms of cancer - don't know stage/prognosis/probability.  Family would like this information but does not want to overwhelm patient as she is already fearful.   - Finances:  Are you concerned about finances.  Considering returning to work?  If not, applying for disability?  Retired, fixed income.  [employer] Medicare.  So far, no financial distress.  What is your understanding of where you are with your cancer? Its cause?  Your treatment plan and what happens next?  74 year old married woman, newly diagnosed w metastatic cancer of unknown primary.  Was recently hospitalized, referred for [facility] PT at discharge.  Walking long distances is difficult.  Uses valet parking and wheelchair transport at [facility].  Leg pain  is primary distressing symptom.  Appetite is not good - has lost weight as a result.  "I just don't have an appetite."  Sleep is difficult due to pain, "I just don't fall asleep."  Diagnosis was traumatic "I was in shock."  "This is the hardest thing I have ever been through."  Has appreciated family support.  CSW and patient discussed common feeling and emotions when being diagnosed with cancer, and the importance of support during treatment. CSW informed patient of the support team and support services at [facility] CSW encouraged patient to call with any questions or concerns.  What are your worries for the future as you begin treatment for cancer?  Not yet "everything is so new."  Taking it one step at a time.  Daughter's main concern is "anxiety and fear about what she is going through."  Family wants to make it as easy as possible for patient to get through treatments, "keep her calm and not worry about the simple things that may arise."    What are your hopes and priorities during your treatment? What is important to you? What are your goals for your care?  Family is important, would like them included.  "I am just taking it as it comes."  Per family, patient likes to "deal with one thing at a time, not have too much going on."  They want to defer [facility] PT  so that patient can focus on getting through radiation - family will contact [facility] agency ([place] per daughter) to reschedule as needed.    CSW Summary:  Patient and family psychosocial functioning including strengths, limitations, and coping skills:  74 yo married female, newly diagnosed w metastatic cancer.  This was a shock to patient and family, they are still absorbing the information and family would like more information about stage, prognosis, treatments and similar.  They are concerned that patient is also having a hard time w this unexpected diagnosis - she is overwhelmed by too much information/too much going on at any one time.  Family  feels it is important to minimize stress on patient and keep treatments/information simply so she can focus on what she needs to do to treat her cancer.  Family is very supportive and patient feels/appreciates this support.  CSW mentioned the need to let providers know family would like more information on patient's situation and may need help from palliative care for symptom management in the future.    Identifications of barriers to care:  Family concerned about patient's anxiety and fears re treatments.  Patient gets overwhelmed by too much information - daughter will be the primary person who will accompany patient.  Would like husband to hear information at appointments - family wonders if two people can be at this appointment.    Availability of community resources:  [place] Worker follow up needed: No.  Please reconsult as needed.  Edwyna Shell, LCSW Clinical Social Worker Phone:  [phone] Cell:  [phone]

## 2019-07-04 NOTE — Progress Notes (Signed)
CRITICAL VALUE STICKER  CRITICAL VALUE: Bili 5.2, AST 802, ALT 871  RECEIVER (on-site recipient of call): Manuela Schwartz, [place] NOTIFIED: 07/04/19 at [place] (representative from lab): Rosann Auerbach  MD NOTIFIED: Cira Rue, NP  TIME OF NOTIFICATION: 1540  RESPONSE: Seeing patient now

## 2019-07-05 ENCOUNTER — Telehealth: Payer: Self-pay | Admitting: Nurse Practitioner

## 2019-07-05 ENCOUNTER — Ambulatory Visit (HOSPITAL_COMMUNITY)
Admission: RE | Admit: 2019-07-05 | Discharge: 2019-07-05 | Disposition: A | Discharge status: Home / Self Care | Payer: Medicare (Managed Care) | Source: Ambulatory Visit | Attending: Nurse Practitioner | Admitting: Nurse Practitioner

## 2019-07-05 ENCOUNTER — Telehealth: Payer: Self-pay | Admitting: Radiation Oncology

## 2019-07-05 NOTE — Telephone Encounter (Signed)
Scheduled appt per 6/4 sch message - unable to reach pt . Left message with appt date and time.

## 2019-07-05 NOTE — Progress Notes (Signed)
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 Site of primary malignancy is not definitively determined, however but favored to represent right lung primary. There is also pancreatic lesion which may be an alternative primary. Tissue sampling is recommended. 2. Bulky mediastinal and right hilar adenopathy, nodal conglomerate causes narrowing of the right upper lobe bronchus, right bronchus intermedius, upper and lower lobe bronchi. Multiple right-sided pulmonary nodules, including pleural based nodules posteriorly. 3. Large liver lesion suspicious for metastasis. There is biliary dilatation which is likely secondary to mass effect on the common bile duct from periportal adenopathy. 4. Multifocal abdominal adenopathy throughout the retroperitoneum, periportal region, central mesentery. 5. Pancreatic mass measuring 4.3 cm which may be metastasis or primary malignancy. 6. Right adrenal mass and left adrenal thickening. 7. Subcutaneous metastasis to the right chest wall. Left paraspinal intramuscular metastasis at the level of L3. 8. Non oncologic findings of right renal atrophy with large nonobstructing stones in the lower right kidney.   Aortic Atherosclerosis (ICD10-I70.0) and Emphysema (ICD10-J43.9).     06/28/2019 Initial Biopsy   FINAL MICROSCOPIC DIAGNOSIS:   A. Lymph node, biopsy:  -  Metastatic neuroendocrine carcinoma, high-grade  -  See comment   COMMENT:   By immunohistochemistry, the neoplastic cells are positive for  cytokeratin 7, chromogranin, synaptic ficin and TTF-1 but negative for  cytokeratin 20.  The proliferative rate by Ki-67 immunohistochemistry is  approximately 80%.  Overall, the findings are consistent with a  high-grade neuroendocrine carcinoma.  The differential includes large  cell neuroendocrine carcinoma.  Small cell carcinoma is less likely  given the overall cytologic appearance.   07/05/2019 Imaging   US Abdomen  IMPRESSION: 6.1 cm solitary mass seen in right hepatic lobe consistent with metastatic disease.   Multiple masses are seen involving the pancreas, with the largest measuring 3.9 cm in the pancreatic tail.   Common bile duct is mildly dilated at 8 mm concerning for distal common bile duct obstruction. Potentially this may be due to previously described masses.   Gallbladder is not visualized.   Calyceal dilatation is again noted involving the right kidney, with probable nonobstructive calculus seen in lower pole collecting system.   07/08/2019 Initial Diagnosis   High grade neuroendocrine carcinoma ([place])      CURRENT THERAPY:  PENDING Palliative RT starting 07/08/19   INTERVAL HISTORY:  Alisha Goodman is here for a follow up. She presents to the clinic with her family. She notes some improvement after IV Fluids. She is drinking juice and milk and urinating well. She is able to eat oatmeal, half a banana and coffee this morning. Yesterday she had soup for lunch and dinner. She is able to drink 1 ensure daily. She feels she does better with liquid. She denies trouble swallowing. She denies fever, chills or dizziness upon standing. She  ambulates with walker sometimes, but most of the time she can ambulate independently and hold onto the  wall. She notes her left thigh pain with intermittent cramping. Pain medication helps this. She takes OxyContin q12hr and using and needing less oxycodone. She feels her pain is still 5-6/10. Mirtazapine was called in but not sure if she is taking it.     REVIEW OF SYSTEMS:   Constitutional: Denies fevers, chills or abnormal weight loss (+) Decrease food intake, weight loss  Eyes: Denies blurriness of vision Ears, nose, mouth, throat, and face: Denies mucositis or sore throat Respiratory: Denies cough, dyspnea or wheezes Cardiovascular: Denies palpitation, chest discomfort or lower extremity swelling Gastrointestinal:  Denies nausea, heartburn or change in bowel habits Skin: Denies abnormal skin rashes MSK: (+)Intermittent left thigh cramp/spasm Lymphatics: Denies new lymphadenopathy or easy bruising Neurological:Denies numbness, tingling or new weaknesses Behavioral/Psych: Mood is stable, no new changes  All other systems were reviewed with the patient and are negative.  MEDICAL HISTORY:  Past Medical History:  Diagnosis Date   Aortic atherosclerosis ([place]) 06/29/2019   Atrial flutter ([place]) 06/29/2019   possible dx   Chronic systolic CHF (congestive heart failure) ([place]) 06/29/2019   Emphysema of lung ([place]) 06/29/2019   Hyperlipidemia    Hypertension    Lumbar radiculopathy    Metastatic cancer ([place]) 06/28/2019   Pain of metastatic malignancy    left paraspinal mass in lumbar area    SURGICAL HISTORY: Past Surgical History:  Procedure Laterality Date   ABDOMINAL HYSTERECTOMY      I have reviewed the social history and family history with the patient and they are unchanged from previous note.  ALLERGIES:  is allergic to sulfa antibiotics and aspirin.  MEDICATIONS:  No current facility-administered medications for this visit.   No current outpatient medications on file.   Facility-Administered Medications Ordered in Other Visits  Medication Dose Route Frequency  Provider Last Rate Last Admin   0.9 %  sodium chloride infusion   Intravenous Continuous Elwyn Reach, MD 100 mL/hr at 07/08/19 2103 New Bag at 07/08/19 2103   ALPRAZolam (XANAX) tablet 0.25 mg  0.25 mg Oral QHS PRN Elwyn Reach, MD   0.25 mg at 07/08/19 2041   cloNIDine (CATAPRES) tablet 0.2 mg  0.2 mg Oral BID Gala Romney L, MD   0.2 mg at 07/08/19 2102   enoxaparin (LOVENOX) injection 40 mg  40 mg Subcutaneous Q24H Elwyn Reach, MD       [START ON 07/09/2019] irbesartan (AVAPRO) tablet 150 mg  150 mg Oral Daily Elwyn Reach, MD       [START ON 07/09/2019] metoprolol succinate (TOPROL-XL) 24 hr tablet 25 mg  25 mg Oral Daily Gala Romney L, MD       mirtazapine (REMERON) tablet 7.5 mg  7.5 mg Oral QHS Gala Romney L, MD   7.5 mg at 07/08/19 2041   [START ON 07/09/2019] nicotine (NICODERM CQ - dosed in mg/24 hours) patch 21 mg  21 mg Transdermal Daily Garba, Mohammad L, MD       ondansetron (ZOFRAN) tablet 4 mg  4 mg Oral Q6H PRN Elwyn Reach, MD       Or   ondansetron (ZOFRAN) injection 4 mg  4 mg Intravenous Q6H PRN Elwyn Reach, MD       oxyCODONE (Oxy IR/ROXICODONE) immediate release tablet 5 mg  5 mg Oral Q6H PRN Elwyn Reach, MD   5 mg at 07/08/19 2043  oxyCODONE (OXYCONTIN) 12 hr tablet 15 mg  15 mg Oral Q12H Gala Romney L, MD   15 mg at 07/08/19 2102   senna (SENOKOT) tablet 8.6 mg  1 tablet Oral Daily PRN Elwyn Reach, MD       [START ON 07/09/2019] spironolactone (ALDACTONE) tablet 12.5 mg  12.5 mg Oral Daily Elwyn Reach, MD        PHYSICAL EXAMINATION: ECOG PERFORMANCE STATUS: 3 - Symptomatic, >50% confined to bed  Vitals:   07/08/19 1251  BP: 124/65  Pulse: (!) 108  Resp: 18  Temp: 97.9 F (36.6 C)  SpO2: 100%   Filed Weights   07/08/19 1251  Weight: 117 lb 9.6 oz (53.3 kg)    Due to COVID19 we will limit examination to appearance. Patient had no complaints.  GENERAL:alert, no distress and  comfortable SKIN: skin color normal, no rashes or significant lesions EYES: normal, Conjunctiva are pink and non-injected, sclera clear  NEURO: alert & oriented x 3 with fluent speech   LABORATORY DATA:  I have reviewed the data as listed CBC Latest Ref Rng & Units 07/08/2019 07/04/2019 07/02/2019  WBC 4.0 - 10.5 K/uL 15.9(H) 10.4 7.6  Hemoglobin 12.0 - 15.0 g/dL 10.3(L) 10.3(L) 9.9(L)  Hematocrit 36.0 - 46.0 % 30.6(L) 31.1(L) 30.7(L)  Platelets 150 - 400 K/uL 548(H) 585(H) 524(H)     CMP Latest Ref Rng & Units 07/08/2019 07/04/2019 07/02/2019  Glucose 70 - 99 mg/dL 120(H) 156(H) 112(H)  BUN 8 - 23 mg/dL _0 Creatinine 0.44 - 1.00 mg/dL 0.89 0.82 0.59  Sodium 135 - 145 mmol/L 127(L) 127(L) 133(L)  Potassium 3.5 - 5.1 mmol/L 4.6 4.7 4.5  Chloride 98 - 111 mmol/L 98 97(L) 101  CO2 22 - 32 mmol/L 19(L) 19(L) 22  Calcium 8.9 - 10.3 mg/dL 9.6 9.8 8.5(L)  Total Protein 6.5 - 8.1 g/dL 8.1 8.3(H) 7.3  Total Bilirubin 0.3 - 1.2 mg/dL 10.9(HH) 5.2(HH) 2.3(H)  Alkaline Phos 38 - 126 U/L 541(H) 602(H) 386(H)  AST 15 - 41 U/L 363(HH) 802(HH) 275(H)  ALT 0 - 44 U/L 706(HH) 871(HH) 278(H)      RADIOGRAPHIC STUDIES: I have personally reviewed the radiological images as listed and agreed with the findings in the report. No results found.   ASSESSMENT & PLAN:  Alisha Goodman is a 74 y.o. female with    1.Metastatic high grade neuroendocrine tumor of pancreas, with mediastinal and abdominal nodes, lung, liver and spinal metastases -She presented with left hip/leg pain, work up revealed widespread disease concerning for malignancy.  -She underwent left [place] LN biopsy on 06/28/19 which revealed metastatic high grade neuroendocrine carcinoma felt to arise from the pancreas. By [facility] cells are positive for cytokeratin-7, chromogranin, synaptoficin and TTF-1 but negative for cytokeratin 20. The proliferative rate by Ki-67 is 80% which makes this poorly differentiated and high grade. I previously discussed  the typical nature of this aggressive cancer behaves similarly to small cell cancer. -she is going to start palliative radiation to the lung mass and paraspinal met felt to be causing her left leg/hip pain on 07/08/19 by Dr. Lisbeth Renshaw  -We discussed standard first line chemo which would include Carboplatin on day 1 and Etoposide on days 1-3, with GCSF day 4, q21 days. She is interested in treatment if she is eligible.  -Unfortunately she has developed rapid worsening of liver function since last week.  I reviewed her CT and recent abdominal ultrasound results, which showed mild to moderate bile  duct dilatation, likely from external compression from portal adenopathy and pancreatic mass -Labs reviewed, WBC 15.9, HG 10.3, plt 548K, ANC 12.8, Na 127, BG 120, Albumin 2.6, AST 363, ALT 706, Alk Phos 541, Tbili 10.9.  Her bilirubin has increased from 2 a week ago to, now to 10.9 today.   -I discussed her case with low-power GI Drs Rush Landmark and Ardis Hughs about ERCP and stent placement for obstructive jaundice.  -Due to her rapid worsening of LFTs, and new leukocytosis today, I am concerned that she is developing acute cholangitis.  I recommend hospital admission -We also discussed the overall very poor prognosis, and the high likelihood that she would not be able to get a systemic chemotherapy.  We discussed the option of palliative care and hospice, means overall very poor prognosis. -After lengthy discussion, patient and her family decided to try ERCP, and hospital admission today  -I will f/u during hospital stay.   2.Left hip and thigh pain, likely related to the left paraspinal metastatic lesion -She plans to start palliative radiation with Dr. Lisbeth Renshaw to start 07/08/19 -Pain mostly controlled (5-6/10) on OxyContin q12 BID and oxyIR q6 PRN, she has not needed as much oxycodone. I may increase dose if not enough.   3. Juandice, secondary to external compression from portal adenopathy and pancreatic mass -Tbili  increased from 3.2 on 6/1 to 5.2 on 6/3, now 10.9 today  -She has rapidly progressing liver failure, unfortunately this appears to be a very aggressive disease  -I discussed admitting her to [facility] hospital today to get her stent placement procedure done sooner. She is agreeable.   4. Weight loss, low performance status, secondary to #1 -I prescribed mirtazapine for appetite, sleep, and mood. She has not picked up yet.  -She is doing PT.  -Her best input is with liquid with low food intake. I recommend she increase Ensure to 2-3 times a day.   5. Goals of care, Anxiety  -She has very close, supportive family who wish to protect her from upsetting news, but we all agree she should know the gravity of her condition. -Appreciate SW involvement   -She understands her cancer is not curable, but treatable if her condition improves and she becomes candidate for chemotherapy.  -I have discussed palliative care vs hospice, she is not ready to discuss hospice now   6. Hyponatremia  -Due to poor nutrition and hydration -Gave IVF 07/04/19 and instructed to increase fluids at home    PLAN: -Admit to [facility] hospital today, will consult [facility] for ERCP. I discussed with Dr. Fuller Plan today   -I will F/u in hospital  -please consult palliative care during her hospital stay  -I will informed Dr. Lisbeth Renshaw  -she is full code now, will continue discuss goal of care and code status after admission   No problem-specific Assessment & Plan notes found for this encounter.   No orders of the defined types were placed in this encounter.  All questions were answered. The patient knows to call the clinic with any problems, questions or concerns. No barriers to learning was detected. The total time spent in the appointment was 45 minutes.     Truitt Merle, MD 07/08/2019   I, Joslyn Devon, am acting as scribe for Truitt Merle, MD.   I have reviewed the above documentation for accuracy and completeness, and I agree with the  above.

## 2019-07-05 NOTE — Telephone Encounter (Signed)
Scheduled appt per 6/3 los.  Left a vm of the appt date and time. 

## 2019-07-08 ENCOUNTER — Inpatient Hospital Stay: Payer: Medicare (Managed Care)

## 2019-07-08 ENCOUNTER — Telehealth: Payer: Self-pay

## 2019-07-08 ENCOUNTER — Inpatient Hospital Stay (HOSPITAL_BASED_OUTPATIENT_CLINIC_OR_DEPARTMENT_OTHER): Payer: Medicare (Managed Care) | Admitting: Hematology

## 2019-07-08 ENCOUNTER — Inpatient Hospital Stay (HOSPITAL_COMMUNITY)
Admission: AD | Admit: 2019-07-08 | Discharge: 2019-07-13 | DRG: 435 | Disposition: A | Discharge status: Home / Self Care | Payer: Medicare (Managed Care) | Source: Ambulatory Visit | Attending: Internal Medicine | Admitting: Internal Medicine

## 2019-07-08 ENCOUNTER — Other Ambulatory Visit: Payer: Self-pay

## 2019-07-08 ENCOUNTER — Encounter (HOSPITAL_COMMUNITY): Payer: Self-pay | Admitting: Internal Medicine

## 2019-07-08 ENCOUNTER — Encounter: Payer: Self-pay | Admitting: Hematology

## 2019-07-08 ENCOUNTER — Ambulatory Visit
Admission: RE | Admit: 2019-07-08 | Discharge: 2019-07-08 | Disposition: A | Discharge status: Home / Self Care | Payer: Medicare (Managed Care) | Source: Ambulatory Visit | Attending: Radiation Oncology | Admitting: Radiation Oncology

## 2019-07-08 ENCOUNTER — Ambulatory Visit: Payer: Medicare (Managed Care) | Admitting: Radiation Oncology

## 2019-07-08 VITALS — BP 124/65 | HR 108 | Temp 97.9°F | Resp 18 | Ht 65.0 in | Wt 117.6 lb

## 2019-07-08 DIAGNOSIS — C7951 Secondary malignant neoplasm of bone: Secondary | ICD-10-CM | POA: Diagnosis present

## 2019-07-08 DIAGNOSIS — C3481 Malignant neoplasm of overlapping sites of right bronchus and lung: Secondary | ICD-10-CM

## 2019-07-08 DIAGNOSIS — R531 Weakness: Secondary | ICD-10-CM

## 2019-07-08 DIAGNOSIS — R000 Tachycardia, unspecified: Secondary | ICD-10-CM | POA: Diagnosis present

## 2019-07-08 DIAGNOSIS — F17210 Nicotine dependence, cigarettes, uncomplicated: Secondary | ICD-10-CM | POA: Diagnosis present

## 2019-07-08 DIAGNOSIS — Z923 Personal history of irradiation: Secondary | ICD-10-CM | POA: Diagnosis not present

## 2019-07-08 DIAGNOSIS — C787 Secondary malignant neoplasm of liver and intrahepatic bile duct: Secondary | ICD-10-CM | POA: Diagnosis present

## 2019-07-08 DIAGNOSIS — Z681 Body mass index (BMI) 19 or less, adult: Secondary | ICD-10-CM | POA: Diagnosis not present

## 2019-07-08 DIAGNOSIS — K8689 Other specified diseases of pancreas: Secondary | ICD-10-CM

## 2019-07-08 DIAGNOSIS — R935 Abnormal findings on diagnostic imaging of other abdominal regions, including retroperitoneum: Secondary | ICD-10-CM | POA: Diagnosis not present

## 2019-07-08 DIAGNOSIS — Z5309 Procedure and treatment not carried out because of other contraindication: Secondary | ICD-10-CM | POA: Diagnosis present

## 2019-07-08 DIAGNOSIS — Z882 Allergy status to sulfonamides status: Secondary | ICD-10-CM

## 2019-07-08 DIAGNOSIS — K831 Obstruction of bile duct: Secondary | ICD-10-CM | POA: Diagnosis present

## 2019-07-08 DIAGNOSIS — Z7189 Other specified counseling: Secondary | ICD-10-CM

## 2019-07-08 DIAGNOSIS — E785 Hyperlipidemia, unspecified: Secondary | ICD-10-CM | POA: Diagnosis present

## 2019-07-08 DIAGNOSIS — I4892 Unspecified atrial flutter: Secondary | ICD-10-CM | POA: Diagnosis present

## 2019-07-08 DIAGNOSIS — N200 Calculus of kidney: Secondary | ICD-10-CM | POA: Diagnosis present

## 2019-07-08 DIAGNOSIS — E871 Hypo-osmolality and hyponatremia: Secondary | ICD-10-CM | POA: Diagnosis present

## 2019-07-08 DIAGNOSIS — R7989 Other specified abnormal findings of blood chemistry: Secondary | ICD-10-CM

## 2019-07-08 DIAGNOSIS — R509 Fever, unspecified: Secondary | ICD-10-CM | POA: Diagnosis present

## 2019-07-08 DIAGNOSIS — K5909 Other constipation: Secondary | ICD-10-CM

## 2019-07-08 DIAGNOSIS — D72829 Elevated white blood cell count, unspecified: Secondary | ICD-10-CM | POA: Diagnosis present

## 2019-07-08 DIAGNOSIS — K869 Disease of pancreas, unspecified: Secondary | ICD-10-CM

## 2019-07-08 DIAGNOSIS — K5900 Constipation, unspecified: Secondary | ICD-10-CM | POA: Diagnosis present

## 2019-07-08 DIAGNOSIS — I5022 Chronic systolic (congestive) heart failure: Secondary | ICD-10-CM | POA: Diagnosis present

## 2019-07-08 DIAGNOSIS — C7989 Secondary malignant neoplasm of other specified sites: Secondary | ICD-10-CM | POA: Diagnosis present

## 2019-07-08 DIAGNOSIS — Z886 Allergy status to analgesic agent status: Secondary | ICD-10-CM

## 2019-07-08 DIAGNOSIS — C259 Malignant neoplasm of pancreas, unspecified: Secondary | ICD-10-CM | POA: Diagnosis present

## 2019-07-08 DIAGNOSIS — J439 Emphysema, unspecified: Secondary | ICD-10-CM | POA: Diagnosis not present

## 2019-07-08 DIAGNOSIS — D649 Anemia, unspecified: Secondary | ICD-10-CM | POA: Diagnosis present

## 2019-07-08 DIAGNOSIS — R590 Localized enlarged lymph nodes: Secondary | ICD-10-CM | POA: Diagnosis present

## 2019-07-08 DIAGNOSIS — E440 Moderate protein-calorie malnutrition: Secondary | ICD-10-CM | POA: Insufficient documentation

## 2019-07-08 DIAGNOSIS — C7B8 Other secondary neuroendocrine tumors: Secondary | ICD-10-CM

## 2019-07-08 DIAGNOSIS — G893 Neoplasm related pain (acute) (chronic): Secondary | ICD-10-CM | POA: Diagnosis present

## 2019-07-08 DIAGNOSIS — C7971 Secondary malignant neoplasm of right adrenal gland: Secondary | ICD-10-CM | POA: Diagnosis present

## 2019-07-08 DIAGNOSIS — R911 Solitary pulmonary nodule: Secondary | ICD-10-CM | POA: Diagnosis present

## 2019-07-08 DIAGNOSIS — D6959 Other secondary thrombocytopenia: Secondary | ICD-10-CM | POA: Diagnosis present

## 2019-07-08 DIAGNOSIS — C7A1 Malignant poorly differentiated neuroendocrine tumors: Secondary | ICD-10-CM | POA: Diagnosis present

## 2019-07-08 DIAGNOSIS — Z20822 Contact with and (suspected) exposure to covid-19: Secondary | ICD-10-CM | POA: Diagnosis present

## 2019-07-08 DIAGNOSIS — Z515 Encounter for palliative care: Secondary | ICD-10-CM | POA: Diagnosis present

## 2019-07-08 DIAGNOSIS — I700 Atherosclerosis of aorta: Secondary | ICD-10-CM | POA: Diagnosis present

## 2019-07-08 DIAGNOSIS — K8309 Other cholangitis: Secondary | ICD-10-CM | POA: Diagnosis present

## 2019-07-08 DIAGNOSIS — I110 Hypertensive heart disease with heart failure: Secondary | ICD-10-CM | POA: Diagnosis present

## 2019-07-08 DIAGNOSIS — Z800 Family history of malignant neoplasm of digestive organs: Secondary | ICD-10-CM

## 2019-07-08 DIAGNOSIS — Z79899 Other long term (current) drug therapy: Secondary | ICD-10-CM

## 2019-07-08 DIAGNOSIS — R0600 Dyspnea, unspecified: Secondary | ICD-10-CM

## 2019-07-08 LAB — CMP (CANCER CENTER ONLY)
ALT: 706 U/L (ref 0–44)
AST: 363 U/L (ref 15–41)
Albumin: 2.6 g/dL — ABNORMAL LOW (ref 3.5–5.0)
Alkaline Phosphatase: 541 U/L — ABNORMAL HIGH (ref 38–126)
Anion gap: 10 (ref 5–15)
BUN: 14 mg/dL (ref 8–23)
CO2: 19 mmol/L — ABNORMAL LOW (ref 22–32)
Calcium: 9.6 mg/dL (ref 8.9–10.3)
Chloride: 98 mmol/L (ref 98–111)
Creatinine: 0.89 mg/dL (ref 0.44–1.00)
GFR, Est AFR Am: >60 mL/min (ref >60)
GFR, Estimated: >60 mL/min (ref >60)
Glucose, Bld: 120 mg/dL — ABNORMAL HIGH (ref 70–99)
Potassium: 4.6 mmol/L (ref 3.5–5.1)
Sodium: 127 mmol/L — ABNORMAL LOW (ref 135–145)
Total Bilirubin: 10.9 mg/dL (ref 0.3–1.2)
Total Protein: 8.1 g/dL (ref 6.5–8.1)

## 2019-07-08 LAB — CBC WITH DIFFERENTIAL (CANCER CENTER ONLY)
Abs Immature Granulocytes: 0.13 10*3/uL — ABNORMAL HIGH (ref 0.00–0.07)
Basophils Absolute: 0 10*3/uL (ref 0.0–0.1)
Basophils Relative: 0 %
Eosinophils Absolute: 0 10*3/uL (ref 0.0–0.5)
Eosinophils Relative: 0 %
HCT: 30.6 % — ABNORMAL LOW (ref 36.0–46.0)
Hemoglobin: 10.3 g/dL — ABNORMAL LOW (ref 12.0–15.0)
Immature Granulocytes: 1 %
Lymphocytes Relative: 10 %
Lymphs Abs: 1.6 10*3/uL (ref 0.7–4.0)
MCH: 27.4 pg (ref 26.0–34.0)
MCHC: 33.7 g/dL (ref 30.0–36.0)
MCV: 81.4 fL (ref 80.0–100.0)
Monocytes Absolute: 1.4 10*3/uL — ABNORMAL HIGH (ref 0.1–1.0)
Monocytes Relative: 9 %
Neutro Abs: 12.8 10*3/uL — ABNORMAL HIGH (ref 1.7–7.7)
Neutrophils Relative %: 80 %
Platelet Count: 548 10*3/uL — ABNORMAL HIGH (ref 150–400)
RBC: 3.76 MIL/uL — ABNORMAL LOW (ref 3.87–5.11)
RDW: 16.6 % — ABNORMAL HIGH (ref 11.5–15.5)
WBC Count: 15.9 10*3/uL — ABNORMAL HIGH (ref 4.0–10.5)
nRBC: 0 % (ref 0.0–0.2)

## 2019-07-08 MED ORDER — SODIUM CHLORIDE 0.9 % IV SOLN: INTRAVENOUS | Status: DC

## 2019-07-08 MED ORDER — ENOXAPARIN SODIUM 40 MG/0.4ML ~~LOC~~ SOLN: 40.0000 mg | SUBCUTANEOUS | Status: DC

## 2019-07-08 MED ORDER — NICOTINE 21 MG/24HR TD PT24
21.0000 mg | MEDICATED_PATCH | Freq: Every day | TRANSDERMAL | Status: DC
  Administered 2019-07-09 – 2019-07-13 (×5): 21 mg via TRANSDERMAL
  Filled 2019-07-08 (×5): qty 1

## 2019-07-08 MED ORDER — CLONIDINE HCL 0.2 MG PO TABS
0.2000 mg | ORAL_TABLET | Freq: Two times a day (BID) | ORAL | Status: DC
  Administered 2019-07-08 – 2019-07-09 (×2): 0.2 mg via ORAL
  Filled 2019-07-08 (×2): qty 1

## 2019-07-08 MED ORDER — MIRTAZAPINE 15 MG PO TABS
7.5000 mg | ORAL_TABLET | Freq: Every day | ORAL | Status: DC
  Administered 2019-07-08 – 2019-07-12 (×5): 7.5 mg via ORAL
  Filled 2019-07-08 (×5): qty 1

## 2019-07-08 MED ORDER — OXYCODONE HCL ER 15 MG PO T12A
15.0000 mg | EXTENDED_RELEASE_TABLET | Freq: Two times a day (BID) | ORAL | Status: DC
  Administered 2019-07-08 – 2019-07-13 (×10): 15 mg via ORAL
  Filled 2019-07-08 (×10): qty 1

## 2019-07-08 MED ORDER — ONDANSETRON HCL 4 MG/2ML IJ SOLN: 4.0000 mg | Freq: Four times a day (QID) | INTRAMUSCULAR | Status: DC | PRN

## 2019-07-08 MED ORDER — BOOST / RESOURCE BREEZE PO LIQD CUSTOM
1.0000 | Freq: Three times a day (TID) | ORAL | Status: DC
  Administered 2019-07-09 – 2019-07-13 (×7): 1 via ORAL

## 2019-07-08 MED ORDER — IRBESARTAN 150 MG PO TABS
150.0000 mg | ORAL_TABLET | Freq: Every day | ORAL | Status: DC
  Administered 2019-07-09: 150 mg via ORAL
  Filled 2019-07-08: qty 1

## 2019-07-08 MED ORDER — ENSURE ENLIVE PO LIQD
237.0000 mL | Freq: Two times a day (BID) | ORAL | Status: DC
  Administered 2019-07-13 (×2): 237 mL via ORAL

## 2019-07-08 MED ORDER — SPIRONOLACTONE 12.5 MG HALF TABLET
12.5000 mg | ORAL_TABLET | Freq: Every day | ORAL | Status: DC
  Administered 2019-07-09: 12.5 mg via ORAL
  Filled 2019-07-08: qty 1

## 2019-07-08 MED ORDER — ONDANSETRON HCL 4 MG PO TABS: 4.0000 mg | ORAL_TABLET | Freq: Four times a day (QID) | ORAL | Status: DC | PRN

## 2019-07-08 MED ORDER — ALPRAZOLAM 0.25 MG PO TABS
0.2500 mg | ORAL_TABLET | Freq: Every evening | ORAL | Status: DC | PRN
  Administered 2019-07-08 – 2019-07-12 (×3): 0.25 mg via ORAL
  Filled 2019-07-08 (×3): qty 1

## 2019-07-08 MED ORDER — SENNA 8.6 MG PO TABS: 1.0000 | ORAL_TABLET | Freq: Every day | ORAL | Status: DC | PRN

## 2019-07-08 MED ORDER — METOPROLOL SUCCINATE ER 25 MG PO TB24
25.0000 mg | ORAL_TABLET | Freq: Every day | ORAL | Status: DC
  Administered 2019-07-09 – 2019-07-11 (×3): 25 mg via ORAL
  Filled 2019-07-08 (×3): qty 1

## 2019-07-08 MED ORDER — OXYCODONE HCL 5 MG PO TABS
5.0000 mg | ORAL_TABLET | Freq: Four times a day (QID) | ORAL | Status: DC | PRN
  Administered 2019-07-08 – 2019-07-13 (×8): 5 mg via ORAL
  Filled 2019-07-08 (×8): qty 1

## 2019-07-08 NOTE — Telephone Encounter (Signed)
I spoke with Mr Aune and let him know Mrs Guyton's radiation appt was moved to 1400.  I reviewed her lab and Dr. Burr Medico appt times with him.  He verbalized understanding.

## 2019-07-08 NOTE — Telephone Encounter (Signed)
-----   Message from Irving Copas., MD sent at 07/07/2019 12:43 PM EDT ----- Looked at the images. I think the Lns in the porta are probably causing an extrinsic obstruction but the Pancreatic body lesion could be hitting there as well. Since there is some ductal dilation bilaterally, we could consider/try an ERCP and see if we could get stents on both sides to see if that helps. I don't have availability this week, though there could be a spot on 6/14 if we make some moves with my other patients. DJ and I can discuss about our schedule to see what could work separately. Lashon Beringer, please see if we can get her on my 6/14 date and start my day at 77.  We can push the other procedures to fit this one in. Thanks. GM ----- Message ----- From: Truitt Merle, MD Sent: 07/07/2019  12:09 AM EDT To: Milus Banister, MD, Kyung Rudd, MD, #  Hi Dan and Chester Holstein,  Could you do me a favor to review her recent CT and abdominal US to see if she is a candidate for biliary stent placement?  She has newly diagnosed high-grade neuroendocrine tumor from pancreas, with diffuse metastasis.  Her tbil is increased significantly in the past week, and I would not be able to give chemo if her LFTs continue worsen.  John, I think she is going to do poorly, may go to hospice soon. Do you want to shorten her radiation course?   Thanks much,  Krista Blue

## 2019-07-08 NOTE — H&P (Signed)
History and Physical   Alisha Goodman:[id number] DOB: 05/30/1945 DOA: 07/08/2019  Referring MD/NP/PA: Cancer center  PCP: Jolinda Croak, MD   Outpatient Specialists: Dr. Truitt Merle, oncology  Patient coming from: Cancer center  Chief Complaint: Nausea with vomiting  HPI: Alisha Goodman is a 74 y.o. 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  Patient has been referred to GI and was scheduled for ERCP with possible stenting on June 14.  She was seen today in the cancer center with worsening symptoms.  She got IV fluids.  Was able to tolerate some juice and milk as well as some oatmeal.  Patient however is deteriorating.  She was sent over to the hospital at this point for admission evaluation and possible ERCP.  Dr. Rush Landmark and GI because of GI aware.  She has had rapid worsening of her LFTs and also new leukocytosis.  There is worried that she may be developing acute cholangitis.  She is also scheduled for palliative radiation with Dr. Lisbeth Renshaw which will have started today.  Prognosis is noted to be poor and family fully informed.  We are admitting the patient with oncology to follow together with GI.Marland Kitchen  Review of Systems: As per HPI otherwise 10 point review of systems negative.    Past Medical History:  Diagnosis Date  . Aortic atherosclerosis ([place]) 06/29/2019  . Atrial flutter ([place]) 06/29/2019   possible dx  . Chronic systolic CHF (congestive heart failure) ([place]) 06/29/2019  . Emphysema of lung ([place]) 06/29/2019  . Hyperlipidemia   . Hypertension   . Lumbar radiculopathy   . Metastatic cancer ([place]) 06/28/2019  . Pain of metastatic malignancy    left paraspinal mass in lumbar area    Past Surgical History:  Procedure Laterality Date  . ABDOMINAL  HYSTERECTOMY       reports that she quit smoking about 4 months ago. Her smoking use included cigarettes. She has never used smokeless tobacco. She reports that she does not drink alcohol or use drugs.  Allergies  Allergen Reactions  . Sulfa Antibiotics     Rash    . Aspirin Nausea Only    Family History  Problem Relation Age of Onset  . Breast cancer Neg Hx      Prior to Admission medications   Medication Sig Start Date End Date Taking? Authorizing Provider  ALPRAZolam (XANAX) 0.25 MG tablet Take 1 tablet (0.25 mg total) by mouth at bedtime as needed for anxiety. 07/04/19  Yes Alla Feeling, NP  cloNIDine (CATAPRES) 0.2 MG tablet Take 1 tablet (0.2 mg total) by mouth 2 (two) times daily. 07/02/19  Yes Pokhrel, Laxman, MD  irbesartan (AVAPRO) 150 MG tablet Take 1 tablet (150 mg total) by mouth daily. 07/03/19  Yes Pokhrel, Laxman, MD  metoprolol succinate (TOPROL XL) 25 MG 24 hr tablet Take 3 tablets (75 mg total) by mouth daily. Patient taking differently: Take 25 mg by mouth in the morning, at noon, and at bedtime.  07/02/19 08/01/19 Yes Pokhrel, Laxman, MD  mirtazapine (REMERON) 7.5 MG tablet Take 1 tablet (7.5 mg total) by mouth at bedtime. 07/04/19  Yes Alla Feeling, NP  nicotine (NICODERM CQ - DOSED IN MG/24 HOURS) 21 mg/24hr patch Place 1 patch (21 mg total) onto the skin daily. 07/03/19  Yes Pokhrel, Corrie Mckusick, MD  oxyCODONE (  OXY IR/ROXICODONE) 5 MG immediate release tablet Take 1 tablet (5 mg total) by mouth every 6 (six) hours as needed for breakthrough pain. 07/04/19  Yes Alla Feeling, NP  oxyCODONE (OXYCONTIN) 15 mg 12 hr tablet Take 1 tablet (15 mg total) by mouth every 12 (twelve) hours. 07/04/19  Yes Alla Feeling, NP  senna (SENOKOT) 8.6 MG TABS tablet Take 1 tablet (8.6 mg total) by mouth in the morning and at bedtime. 07/02/19  Yes Pokhrel, Laxman, MD  spironolactone (ALDACTONE) 25 MG tablet Take 0.5 tablets (12.5 mg total) by mouth daily. 07/03/19  Yes Pokhrel, Corrie Mckusick, MD     Physical Exam: Vitals:   07/08/19 1755  BP: 102/72  Pulse: (!) 120  Resp: 16  Temp: 100 F (37.8 C)  TempSrc: Oral  SpO2: 98%  Weight: 53.3 kg  Height: 5\' 5"  (1.651 m)      Constitutional: Chronically ill looking, weak no distress Vitals:   07/08/19 1755  BP: 102/72  Pulse: (!) 120  Resp: 16  Temp: 100 F (37.8 C)  TempSrc: Oral  SpO2: 98%  Weight: 53.3 kg  Height: 5\' 5"  (1.651 m)   Eyes: PERRL, lids and conjunctivae jaundiced ENMT: Mucous membranes are dry. Posterior pharynx clear of any exudate or lesions.Normal dentition.  Neck: normal, supple, no masses, no thyromegaly Respiratory: clear to auscultation bilaterally, no wheezing, no crackles. Normal respiratory effort. No accessory muscle use.  Cardiovascular: Sinus tachycardia, no murmurs / rubs / gallops. No extremity edema. 2+ pedal pulses. No carotid bruits.  Abdomen: Distended, diffuse tenderness no masses palpated. No hepatosplenomegaly. Bowel sounds positive.  Musculoskeletal: no clubbing / cyanosis. No joint deformity upper and lower extremities. Good ROM, no contractures. Normal muscle tone.  Skin: no rashes, lesions, ulcers. No induration Neurologic: CN 2-12 grossly intact. Sensation intact, DTR normal. Strength 5/5 in all 4.  Psychiatric: Depressed, fully awake and oriented, withdrawn   Labs on Admission: I have personally reviewed following labs and imaging studies  CBC: Recent Labs  Lab 07/02/19 0415 07/04/19 1424 07/08/19 1219  WBC 7.6 10.4 15.9*  NEUTROABS  --  7.9* 12.8*  HGB 9.9* 10.3* 10.3*  HCT 30.7* 31.1* 30.6*  MCV 84.3 80.4 81.4  PLT 524* 585* 814*   Basic Metabolic Panel: Recent Labs  Lab 07/02/19 0415 07/04/19 1424 07/08/19 1219  NA 133* 127* 127*  K 4.5 4.7 4.6  CL 101 97* 98  CO2 22 19* 19*  GLUCOSE 112* 156* 120*  BUN 9 9 14   CREATININE 0.59 0.82 0.89  CALCIUM 8.5* 9.8 9.6  MG 2.3  --   --   PHOS 3.1  --   --    GFR: Estimated Creatinine Clearance: 46.7  mL/min (by C-G formula based on SCr of 0.89 mg/dL). Liver Function Tests: Recent Labs  Lab 07/02/19 0415 07/04/19 1424 07/08/19 1219  AST 275* 802* 363*  ALT 278* 871* 706*  ALKPHOS 386* 602* 541*  BILITOT 2.3* 5.2* 10.9*  PROT 7.3 8.3* 8.1  ALBUMIN 2.9* 2.9* 2.6*   No results for input(s): LIPASE, AMYLASE in the last 168 hours. No results for input(s): AMMONIA in the last 168 hours. Coagulation Profile: No results for input(s): INR, PROTIME in the last 168 hours. Cardiac Enzymes: No results for input(s): CKTOTAL, CKMB, CKMBINDEX, TROPONINI in the last 168 hours. BNP (last 3 results) No results for input(s): PROBNP in the last 8760 hours. HbA1C: No results for input(s): HGBA1C in the last 72 hours. CBG: No results for input(s): GLUCAP  in the last 168 hours. Lipid Profile: No results for input(s): CHOL, HDL, LDLCALC, TRIG, CHOLHDL, LDLDIRECT in the last 72 hours. Thyroid Function Tests: No results for input(s): TSH, T4TOTAL, FREET4, T3FREE, THYROIDAB in the last 72 hours. Anemia Panel: No results for input(s): VITAMINB12, FOLATE, FERRITIN, TIBC, IRON, RETICCTPCT in the last 72 hours. Urine analysis:    Component Value Date/Time   COLORURINE AMBER (A) 06/28/2019 0330   APPEARANCEUR CLOUDY (A) 06/28/2019 0330   LABSPEC 1.024 06/28/2019 0330   PHURINE 7.0 06/28/2019 0330   GLUCOSEU NEGATIVE 06/28/2019 0330   HGBUR SMALL (A) 06/28/2019 0330   BILIRUBINUR NEGATIVE 06/28/2019 0330   KETONESUR NEGATIVE 06/28/2019 0330   PROTEINUR 100 (A) 06/28/2019 0330   NITRITE NEGATIVE 06/28/2019 0330   LEUKOCYTESUR LARGE (A) 06/28/2019 0330   Sepsis Labs: @LABRCNTIP (procalcitonin:4,lacticidven:4) )No results found for this or any previous visit (from the past 240 hour(s)).   Radiological Exams on Admission: No results found.    Assessment/Plan Principal Problem:   Obstructive jaundice Active Problems:   Atrial flutter (HCC)   Hyponatremia   Emphysema of lung (H[facility])   Chronic  systolic CHF (congestive heart failure) (H[facility])   High grade neuroendocrine carcinoma (H[facility])     #1 obstructive jaundice: Secondary to metastatic pancreatic tumor.  Patient will be admitted.  We will continue with home regimen including pain management.  Dr. Pilar Plate and GI to follow.  All further treatment will be based on their recommendations.  We will continue supportive care.  #2 chronic CHF: Continue to monitor.  Appears compensated.  #3 metastatic pancreatic cancer: Palliation only.  Continue per oncology and radiation oncology.  #4 hyponatremia: Monitor sodium level.  #5 history of COPD: No exacerbation.   DVT prophylaxis: Lovenox Code Status: Full code Family Communication: Daughter is at bedside Disposition Plan: To be determined Consults called: Dr. Burr Medico Admission status: Inpatient  Severity of Illness: The appropriate patient status for this patient is INPATIENT. Inpatient status is judged to be reasonable and necessary in order to provide the required intensity of service to ensure the patient's safety. The patient's presenting symptoms, physical exam findings, and initial radiographic and laboratory data in the context of their chronic comorbidities is felt to place them at high risk for further clinical deterioration. Furthermore, it is not anticipated that the patient will be medically stable for discharge from the hospital within 2 midnights of admission. The following factors support the patient status of inpatient.   " The patient's presenting symptoms include abdominal pain nausea vomiting. " The worrisome physical exam findings include jaundiced. " The initial radiographic and laboratory data are worrisome because of elevated LFTs. " The chronic co-morbidities include metastatic pancreatic cancer.   * I certify that at the point of admission it is my clinical judgment that the patient will require inpatient hospital care spanning beyond 2 midnights from the point of  admission due to high intensity of service, high risk for further deterioration and high frequency of surveillance required.Barbette Merino MD [facility] Pager [phone]  If 7PM-7AM, please contact night-coverage [email or web address] Password [facility]  07/08/2019, 7:19 PM

## 2019-07-08 NOTE — Progress Notes (Signed)
Critical Values: T. Bili 10.9 AST 363 ALT 706 Dr. Burr Medico notified.

## 2019-07-08 NOTE — Telephone Encounter (Signed)
ERCP 07/15/19 730 am at [facility] with Dr Jerilynn Mages. COVID test on 6/10 at 1030 am.  Left message on machine to call back

## 2019-07-09 ENCOUNTER — Ambulatory Visit
Admission: RE | Admit: 2019-07-09 | Discharge: 2019-07-09 | Disposition: A | Discharge status: Home / Self Care | Payer: Medicare (Managed Care) | Source: Ambulatory Visit | Attending: Radiation Oncology | Admitting: Radiation Oncology

## 2019-07-09 ENCOUNTER — Ambulatory Visit: Payer: Medicare (Managed Care)

## 2019-07-09 DIAGNOSIS — E440 Moderate protein-calorie malnutrition: Secondary | ICD-10-CM | POA: Insufficient documentation

## 2019-07-09 DIAGNOSIS — R935 Abnormal findings on diagnostic imaging of other abdominal regions, including retroperitoneum: Secondary | ICD-10-CM

## 2019-07-09 LAB — CBC
HCT: 29 % — ABNORMAL LOW (ref 36.0–46.0)
Hemoglobin: 9.7 g/dL — ABNORMAL LOW (ref 12.0–15.0)
MCH: 27.6 pg (ref 26.0–34.0)
MCHC: 33.4 g/dL (ref 30.0–36.0)
MCV: 82.4 fL (ref 80.0–100.0)
Platelets: 519 10*3/uL — ABNORMAL HIGH (ref 150–400)
RBC: 3.52 MIL/uL — ABNORMAL LOW (ref 3.87–5.11)
RDW: 17.2 % — ABNORMAL HIGH (ref 11.5–15.5)
WBC: 13.1 10*3/uL — ABNORMAL HIGH (ref 4.0–10.5)
nRBC: 0 % (ref 0.0–0.2)

## 2019-07-09 LAB — COMPREHENSIVE METABOLIC PANEL
ALT: 457 U/L — ABNORMAL HIGH (ref 0–44)
AST: 191 U/L — ABNORMAL HIGH (ref 15–41)
Albumin: 2.9 g/dL — ABNORMAL LOW (ref 3.5–5.0)
Alkaline Phosphatase: 389 U/L — ABNORMAL HIGH (ref 38–126)
Anion gap: 13 (ref 5–15)
BUN: 19 mg/dL (ref 8–23)
CO2: 17 mmol/L — ABNORMAL LOW (ref 22–32)
Calcium: 8.5 mg/dL — ABNORMAL LOW (ref 8.9–10.3)
Chloride: 99 mmol/L (ref 98–111)
Creatinine, Ser: 0.75 mg/dL (ref 0.44–1.00)
GFR calc Af Amer: >60 mL/min (ref >60)
GFR calc non Af Amer: >60 mL/min (ref >60)
Glucose, Bld: 107 mg/dL — ABNORMAL HIGH (ref 70–99)
Potassium: 4.2 mmol/L (ref 3.5–5.1)
Sodium: 129 mmol/L — ABNORMAL LOW (ref 135–145)
Total Bilirubin: 11.6 mg/dL — ABNORMAL HIGH (ref 0.3–1.2)
Total Protein: 7.6 g/dL (ref 6.5–8.1)

## 2019-07-09 LAB — SARS CORONAVIRUS 2 BY RT PCR (HOSPITAL ORDER, PERFORMED IN ~~LOC~~ HOSPITAL LAB): SARS Coronavirus 2: NEGATIVE

## 2019-07-09 MED ORDER — PIPERACILLIN-TAZOBACTAM 3.375 G IVPB
3.3750 g | Freq: Three times a day (TID) | INTRAVENOUS | Status: DC
  Administered 2019-07-09 – 2019-07-13 (×12): 3.375 g via INTRAVENOUS
  Filled 2019-07-09 (×13): qty 50

## 2019-07-09 MED ORDER — SENNOSIDES-DOCUSATE SODIUM 8.6-50 MG PO TABS
2.0000 | ORAL_TABLET | Freq: Every day | ORAL | Status: DC
  Administered 2019-07-10 – 2019-07-13 (×3): 2 via ORAL
  Filled 2019-07-09 (×5): qty 2

## 2019-07-09 MED ORDER — SENNOSIDES-DOCUSATE SODIUM 8.6-50 MG PO TABS
1.0000 | ORAL_TABLET | Freq: Every day | ORAL | Status: DC
  Administered 2019-07-10 – 2019-07-12 (×3): 1 via ORAL
  Filled 2019-07-09 (×3): qty 1

## 2019-07-09 MED ORDER — PIPERACILLIN-TAZOBACTAM 3.375 G IVPB 30 MIN: 3.3750 g | Freq: Three times a day (TID) | INTRAVENOUS | Status: DC

## 2019-07-09 NOTE — Progress Notes (Addendum)
PROGRESS NOTE    Alisha Goodman  TGY:[id number] DOB: 1945/07/27 DOA: 07/08/2019 PCP: Jolinda Croak, MD    Brief Narrative: HPI per Dr. Jonelle Sidle 07/08/2019 Alisha Goodman is a 74 y.o. 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  Patient has been referred to GI and was scheduled for ERCP with possible stenting on June 14.  She was seen today in the cancer center with worsening symptoms.  She got IV fluids.  Was able to tolerate some juice and milk as well as some oatmeal.  Patient however is deteriorating.  She was sent over to the hospital at this point for admission evaluation and possible ERCP.  Dr. Rush Landmark and GI because of GI aware.  She has had rapid worsening of her LFTs and also new leukocytosis.  There is worried that she may be developing acute cholangitis.  She is also scheduled for palliative radiation with Dr. Lisbeth Renshaw which will have started today.  Prognosis is noted to be poor and family fully informed.  We are admitting the patient with oncology to follow together with GI.Marland Kitchen  Assessment & Plan:   Principal Problem:   Obstructive jaundice Active Problems:   Atrial flutter (HCC)   Hyponatremia   Emphysema of lung (HCC)   Chronic systolic CHF (congestive heart failure) (HCC)   High grade neuroendocrine carcinoma (HCC)   #1 metastatic pancreatic cancer with diffuse mets to the liver, right adrenal mass, right chest wall, lumbar spine with abdominal lymph adenopathy Abdominal ultrasound 07/05/2019 dilated CBD concerning for obstruction due to mass-effect 6.1 cm right liver mass and multiple mass in the pancreas.  Status post radiation 6 7 with plans for chemotherapy by Dr. Annamaria Boots. She was admitted from Dr. Fritz Pickerel office with elevated LFTs and  leukocytosis. Appreciate GI input.  Patient is scheduled for ERCP with possible biliary stent on 07/10/2019. Covid test ordered today. Continue normal saline cautiously keeping in mind her ejection fraction is very low at 20%. Continue Zosyn. Total bilirubin 11.6 trending up from 10.9 yesterday. AST 191 from 363 ALT 457 from 706 Leukocytosis improving.  #2 constipation continue senna  #3anemia hemoglobin 10.3 on admit, 9.7 today likely hemodilution.  No evidence of active bleeding.  #4history of essential hypertension now with soft blood pressure 94/55 She is on Avapro, Catapres, metoprolol, Aldactone.  I have stopped Avapro Catapres and Aldactone continue metoprolol since she is tachycardic. Continue normal saline carefully  #5 history of hyperlipidemia not on statins  #6 history of chronic systolic CHF with ejection fraction 20 to 25%.  Euvolemic.  Monitor on IV fluids.  #7 hyponatremia-sodium 127 on admission up to 129 with normal saline.  Monitor in a.m.  #8 sinus tachycardia multifactorial continue beta-blocker and continue pain control  #9 tobacco use continue nicotine patch  #10 decreased appetite on Remeron  Nutrition Problem: Moderate Malnutrition Etiology: chronic illness, cancer and cancer related treatments     Signs/Symptoms: percent weight loss, energy intake < or equal to 75% for > or equal to 1 month, moderate muscle depletion, moderate fat depletion Percent weight loss: 14 %(x 2-3 months)    Interventions: Boost Breeze  Estimated body mass index is 19.57 kg/m as calculated from the following:   Height as of this encounter: 5\' 5"  (1.651 m).   Weight as of this encounter: 53.3 kg.  DVT  prophylaxis: Lovenox on hold for procedure tomorrow.  Restart Lovenox after the procedure. code Status: Full code Family Communication: Discussed with husband at bedside Disposition Plan:  Status is: Inpatient  Dispo: The patient is from: Home              Anticipated  d/c is to: Home              Anticipated d/c date is: Unknown              Patient currently is not medically stable to d/c.   Consultants:   GI, oncology  Procedures: None Antimicrobials Zosyn  Subjective: Patient resting in bed, had a bowel movement no nausea vomiting does have some abdominal discomfort and pain  Objective: Vitals:   07/09/19 0949 07/09/19 1157 07/09/19 1339 07/09/19 1357  BP: 139/78 112/63 93/62 (!) 94/55  Pulse: (!) 117 (!) 108 (!) 106 (!) 101  Resp: 16 14 16    Temp: 98.3 F (36.8 C) (!) 97.4 F (36.3 C) 98.2 F (36.8 C)   TempSrc: Oral Oral Oral   SpO2: 97% 94% 96%   Weight:      Height:        Intake/Output Summary (Last 24 hours) at 07/09/2019 1522 Last data filed at 07/09/2019 1506 Gross per 24 hour  Intake 680 ml  Output --  Net 680 ml   Filed Weights   07/08/19 1755  Weight: 53.3 kg    Examination:  General exam: Appears calm and comfortable  Respiratory system: Diminished breath sounds at the bases to auscultation. Respiratory effort normal. Cardiovascular system: S1 & S2 heard, RRR. No JVD, murmurs, rubs, gallops or clicks. No pedal edema. Gastrointestinal system: Abdomen is nondistended, soft and nontender. No organomegaly or masses felt. Normal bowel sounds heard. Central nervous system: Alert and oriented. No focal neurological deficits. Extremities: Symmetric 5 x 5 power. Skin: No rashes, lesions or ulcers Psychiatry: Judgement and insight appear normal. Mood & affect appropriate.     Data Reviewed: I have personally reviewed following labs and imaging studies  CBC: Recent Labs  Lab 07/04/19 1424 07/08/19 1219 07/09/19 0734  WBC 10.4 15.9* 13.1*  NEUTROABS 7.9* 12.8*  --   HGB 10.3* 10.3* 9.7*  HCT 31.1* 30.6* 29.0*  MCV 80.4 81.4 82.4  PLT 585* 548* 654*   Basic Metabolic Panel: Recent Labs  Lab 07/04/19 1424 07/08/19 1219 07/09/19 0734  NA 127* 127* 129*  K 4.7 4.6 4.2  CL 97* 98 99  CO2 19* 19* 17*   GLUCOSE 156* 120* 107*  BUN 9 14 19   CREATININE 0.82 0.89 0.75  CALCIUM 9.8 9.6 8.5*   GFR: Estimated Creatinine Clearance: 51.9 mL/min (by C-G formula based on SCr of 0.75 mg/dL). Liver Function Tests: Recent Labs  Lab 07/04/19 1424 07/08/19 1219 07/09/19 0734  AST 802* 363* 191*  ALT 871* 706* 457*  ALKPHOS 602* 541* 389*  BILITOT 5.2* 10.9* 11.6*  PROT 8.3* 8.1 7.6  ALBUMIN 2.9* 2.6* 2.9*   No results for input(s): LIPASE, AMYLASE in the last 168 hours. No results for input(s): AMMONIA in the last 168 hours. Coagulation Profile: No results for input(s): INR, PROTIME in the last 168 hours. Cardiac Enzymes: No results for input(s): CKTOTAL, CKMB, CKMBINDEX, TROPONINI in the last 168 hours. BNP (last 3 results) No results for input(s): PROBNP in the last 8760 hours. HbA1C: No results for input(s): HGBA1C in the last 72 hours. CBG: No results for input(s): GLUCAP in the last 168 hours.  Lipid Profile: No results for input(s): CHOL, HDL, LDLCALC, TRIG, CHOLHDL, LDLDIRECT in the last 72 hours. Thyroid Function Tests: No results for input(s): TSH, T4TOTAL, FREET4, T3FREE, THYROIDAB in the last 72 hours. Anemia Panel: No results for input(s): VITAMINB12, FOLATE, FERRITIN, TIBC, IRON, RETICCTPCT in the last 72 hours. Sepsis Labs: No results for input(s): PROCALCITON, LATICACIDVEN in the last 168 hours.  Recent Results (from the past 240 hour(s))  SARS Coronavirus 2 by RT PCR (hospital order, performed in [facility] hospital lab) Nasopharyngeal Nasopharyngeal Swab     Status: None   Collection Time: 07/09/19 12:30 PM   Specimen: Nasopharyngeal Swab  Result Value Ref Range Status   SARS Coronavirus 2 NEGATIVE NEGATIVE Final    Comment: (NOTE) SARS-CoV-2 target nucleic acids are NOT DETECTED. The SARS-CoV-2 RNA is generally detectable in upper and lower respiratory specimens during the acute phase of infection. The lowest concentration of SARS-CoV-2 viral copies this assay  can detect is 250 copies / mL. A negative result does not preclude SARS-CoV-2 infection and should not be used as the sole basis for treatment or other patient management decisions.  A negative result may occur with improper specimen collection / handling, submission of specimen other than nasopharyngeal swab, presence of viral mutation(s) within the areas targeted by this assay, and inadequate number of viral copies (<250 copies / mL). A negative result must be combined with clinical observations, patient history, and epidemiological information. Fact Sheet for Patients:   StrictlyIdeas.no Fact Sheet for Healthcare Providers: BankingDealers.co.za This test is not yet approved or cleared  by the [place] FDA and has been authorized for detection and/or diagnosis of SARS-CoV-2 by FDA under an Emergency Use Authorization (EUA).  This EUA will remain in effect (meaning this test can be used) for the duration of the COVID-19 declaration under Section 564(b)(1) of the Act, 21 U.S.C. section 360bbb-3(b)(1), unless the authorization is terminated or revoked sooner. Performed at [facility], [place] [address], [place], [place] [address]          Radiology Studies: No results found.      Scheduled Meds: . cloNIDine  0.2 mg Oral BID  . enoxaparin (LOVENOX) injection  40 mg Subcutaneous Q24H  . feeding supplement  1 Container Oral TID BM  . feeding supplement (ENSURE ENLIVE)  237 mL Oral BID BM  . irbesartan  150 mg Oral Daily  . metoprolol succinate  25 mg Oral Daily  . mirtazapine  7.5 mg Oral QHS  . nicotine  21 mg Transdermal Daily  . oxyCODONE  15 mg Oral Q12H  . senna-docusate  1 tablet Oral QHS  . senna-docusate  2 tablet Oral Daily  . spironolactone  12.5 mg Oral Daily   Continuous Infusions: . sodium chloride 100 mL/hr at 07/08/19 2103  . piperacillin-tazobactam (ZOSYN)  IV 3.375 g (07/09/19 1025)      LOS: 1 day    Georgette Shell, MD  07/09/2019, 3:22 PM

## 2019-07-09 NOTE — Consult Note (Addendum)
Referring Provider: Dr. Gala Romney  Primary Care Physician:  Jolinda Croak, MD Primary Gastroenterologist:  Althia Forts  Reason for Consultation: Obstructive jaundice secondary to neuroendocrine tumor of the pancreas   HPI: Alisha Goodman is a 74 y.o. female with a past medical of hypertension, hyperlipidemia, chronic smoker and systolic CHF (diagnosed during her hospitalization 06/28/2019 with EF 20 -25%). She developed right thigh pain one year ago, treated with physical therapy. A few weeks ago, she developed left thigh pain for which she was admitted to the hospital 06/28/2019.  Her LFTs and T. Bili levels were elevated at that time. She underwent an abdominal/pelvic CT 06/28/2019 which identified a 4.3 mass to the pancreas with diffuse metastasis to the liver, right adrenal mass, right chest wall, left paraspinal intramuscular metastasis L3 level with adenopathy throughout the retroperitoneum,periportal region, central mesentery with bulky mediastinal and right hilar adenopathy.  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 She was discharged home with plans for outpatient chemotherapy and palliative radiation.  She underwent an abdominal sonogram on 07/05/2019 which showed a dilated common bile duct concerning for obstruction due to mass effect, a 6.1 cm right liver mass and multiple masses in the pancreas, the largest measuring 3.9cm in the tail of the pancreas.  She received her first  radiation therapy on  6/7 with plans for chemotherapy including Carboplatin and Etoposide with  GCSF. Her oncologist, Dr. Truitt Merle, contacted Dr. Rush Landmark from our office with plans for an outpatient ERCP and possible stent placement scheduled on 07/15/2019. However, the patient was seen in the office yesterday by Dr. Burr Medico following her initial radiation treatment and laboratory studies done showed worsening LFTs with leukocytosis. She  was sent directly to [facility] for admission.   She denies having any nausea or vomiting.  He denies having any upper or lower abdominal pain.  She has constipation.  She passed 2 small hard darker brown stools yesterday and one small brown stool this morning.  No rectal bleeding or melena.  She is tolerating a clear liquid diet. No fever, sweats or chills.  She has lost 15 pounds over the past year.  No NSAID use.  No alcohol use.  She smoked less than 1 pack of cigarettes daily for 50 years.  Her brother died from pancreatic cancer at the age of 9. Her husband is a the bed side.     CMP Latest Ref Rng & Units 07/08/2019 07/04/2019 07/02/2019  Glucose 70 - 99 mg/dL 120(H) 156(H) 112(H)  BUN 8 - 23 mg/dL 14 9 9   Creatinine 0.44 - 1.00 mg/dL 0.89 0.82 0.59  Sodium 135 - 145 mmol/L 127(L) 127(L) 133(L)  Potassium 3.5 - 5.1 mmol/L 4.6 4.7 4.5  Chloride 98 - 111 mmol/L 98 97(L) 101  CO2 22 - 32 mmol/L 19(L) 19(L) 22  Calcium 8.9 - 10.3 mg/dL 9.6 9.8 8.5(L)  Total Protein 6.5 - 8.1 g/dL 8.1 8.3(H) 7.3  Total Bilirubin 0.3 - 1.2 mg/dL 10.9(HH) 5.2(HH) 2.3(H)  Alkaline Phos 38 - 126 U/L 541(H) 602(H) 386(H)  AST 15 - 41 U/L 363(HH) 802(HH) 275(H)  ALT 0 - 44 U/L 706(HH) 871(HH) 278(H)    CBC Latest Ref Rng & Units 07/08/2019 07/04/2019 07/02/2019  WBC 4.0 - 10.5 K/uL 15.9(H) 10.4 7.6  Hemoglobin 12.0 - 15.0 g/dL 10.3(L) 10.3(L) 9.9(L)  Hematocrit 36.0 - 46.0 % 30.6(L) 31.1(L) 30.7(L)  Platelets 150 - 400 K/uL 548(H) 585(H) 524(H)  SARS Coronavirus 2 negative on 06/28/2019 She received 2 Covid 19 vaccinations  Abdominal sonogram 07/05/2019:  6.1 cm solitary mass seen in right hepatic lobe consistent with metastatic disease.  Multiple masses are seen involving the pancreas, with the largest measuring 3.9 cm in the pancreatic tail.  Common bile duct is mildly dilated at 8 mm concerning for distal common bile duct obstruction. Potentially this may be due to previously described  masses.  Gallbladder is not visualized.  Calyceal dilatation is again noted involving the right kidney, with probable nonobstructive calculus seen in lower pole collecting system  Chest CT with contrast 06/28/2019:  Cardiovascular: Aortic atherosclerosis. Aortic tortuosity without aneurysm. Heart is normal in size. Coronary artery calcifications. No pericardial effusion. Extensive mediastinal adenopathy causes mass effect and narrowing of the right pulmonary arteries. No obvious intraluminal filling defect.  Mediastinum/Nodes: Extensive mediastinal and right hilar adenopathy, with confluent nodal conglomerate extending from the subcarinal station to the level of the mid trachea. Lymph nodes causes mass effect and narrowing of the right upper lobe bronchus bronchus intermedius and both lower lobe bronchi. Representative measurement at the level of the carina of 6.8 x 5.3 cm, series 1, image 23. Subcarinal nodal conglomerate measures 3.4 x 5.2 cm, series 1, image 30. Prevascular nodal mass measuring 5.4 x 2.3 cm, series 1, image 25. Left supraclavicular adenopathy measuring 2 x 2 cm, series 1, image 5. Additional adenopathy at multiple stations. Many of these lymph nodes are partially calcified and low-density/necrotic. There is no left hilar adenopathy. There is no axillary adenopathy. Slight mass effect on the esophagus from nodal conglomerate, which is difficult to delineate in the mid distal portion. No visualized thyroid nodule.  Lungs/Pleura: There are multiple right pulmonary nodules. It is difficult to delineate nodules from adenopathy in the central right lung. Adenopathy is irregular borders. Multiple branching nodules in the right upper lobe which may represent lymphangitic spread, for example series 3, image 54. There is a 10 mm medial right lower lobe pulmonary nodule, series 3, image 73. 9 mm right lower lobe nodule series 3, image 87. There is pleural based  nodularity in the posterior right upper lobe that is contiguous with adenopathy. Adenopathy causes mass effect and narrowing of the right upper lobe bronchus, bronchus intermedius and right middle and lower lobe bronchi. No significant pleural effusion.  No discrete left lung pulmonary nodules.  Mild emphysema.  Musculoskeletal: No blastic or destructive lytic lesions. Right chest wall metastasis, series 1, image 35.  Abdominal/Pelvic CT with contrast 06/28/2019:  1. 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 Site of primary malignancy is not definitively determined, however but favored to represent right lung primary. There is also pancreatic lesion which may be an alternative primary. Tissue sampling is recommended. 2. Bulky mediastinal and right hilar adenopathy, nodal conglomerate causes narrowing of the right upper lobe bronchus, right bronchus intermedius, upper and lower lobe bronchi. Multiple right-sided pulmonary nodules, including pleural based nodules posteriorly. 3. Large liver lesion suspicious for metastasis. There is biliary dilatation which is likely secondary to mass effect on the common bile duct from periportal adenopathy. 4. Multifocal abdominal adenopathy throughout the retroperitoneum, periportal region, central mesentery. 5. Pancreatic mass measuring 4.3 cm which may be metastasis or primary malignancy. 6. Right adrenal mass and left adrenal thickening. 7. Subcutaneous metastasis to the right chest wall. Left paraspinal intramuscular metastasis at the level of L3. 8. Non oncologic findings of right renal atrophy with large nonobstructing stones in the lower right kidney.  Aortic Atherosclerosis  ECHO 06/28/2019: 1. No apical thrombus by Definity contrast. Left ventricular ejection  fraction, by estimation, is 20 to 25%. The left ventricle has severely  decreased function. The left ventricle demonstrates global hypokinesis.  The  left ventricular internal cavity  size was mildly dilated. indeterminate diastolic function, due to what  appears to be atrial flutter.  2. Right ventricular systolic function is hyperdynamic. The right  ventricular size is normal.  3. The mitral valve is grossly normal. No evidence of mitral valve  regurgitation.  4. The aortic valve was not well visualized. Aortic valve regurgitation  is not visualized. Mild aortic valve sclerosis is present, with no  evidence of aortic valve stenosis.   Past Medical History:  Diagnosis Date  . Aortic atherosclerosis ([place]) 06/29/2019  . Atrial flutter ([place]) 06/29/2019   possible dx  . Chronic systolic CHF (congestive heart failure) ([place]) 06/29/2019  . Emphysema of lung ([place]) 06/29/2019  . Hyperlipidemia   . Hypertension   . Lumbar radiculopathy   . Metastatic cancer ([place]) 06/28/2019  . Pain of metastatic malignancy    left paraspinal mass in lumbar area    Past Surgical History:  Procedure Laterality Date  . ABDOMINAL HYSTERECTOMY      Prior to Admission medications   Medication Sig Start Date End Date Taking? Authorizing Provider  ALPRAZolam (XANAX) 0.25 MG tablet Take 1 tablet (0.25 mg total) by mouth at bedtime as needed for anxiety. 07/04/19  Yes Alla Feeling, NP  cloNIDine (CATAPRES) 0.2 MG tablet Take 1 tablet (0.2 mg total) by mouth 2 (two) times daily. 07/02/19  Yes Pokhrel, Laxman, MD  irbesartan (AVAPRO) 150 MG tablet Take 1 tablet (150 mg total) by mouth daily. 07/03/19  Yes Pokhrel, Laxman, MD  metoprolol succinate (TOPROL XL) 25 MG 24 hr tablet Take 3 tablets (75 mg total) by mouth daily. Patient taking differently: Take 25 mg by mouth in the morning, at noon, and at bedtime.  07/02/19 08/01/19 Yes Pokhrel, Laxman, MD  mirtazapine (REMERON) 7.5 MG tablet Take 1 tablet (7.5 mg total) by mouth at bedtime. 07/04/19  Yes Alla Feeling, NP  nicotine (NICODERM CQ - DOSED IN MG/24 HOURS) 21 mg/24hr patch Place 1 patch (21 mg total) onto the  skin daily. 07/03/19  Yes Pokhrel, Laxman, MD  oxyCODONE (OXY IR/ROXICODONE) 5 MG immediate release tablet Take 1 tablet (5 mg total) by mouth every 6 (six) hours as needed for breakthrough pain. 07/04/19  Yes Alla Feeling, NP  oxyCODONE (OXYCONTIN) 15 mg 12 hr tablet Take 1 tablet (15 mg total) by mouth every 12 (twelve) hours. 07/04/19  Yes Alla Feeling, NP  senna (SENOKOT) 8.6 MG TABS tablet Take 1 tablet (8.6 mg total) by mouth in the morning and at bedtime. 07/02/19  Yes Pokhrel, Laxman, MD  spironolactone (ALDACTONE) 25 MG tablet Take 0.5 tablets (12.5 mg total) by mouth daily. 07/03/19  Yes Pokhrel, Corrie Mckusick, MD    Current Facility-Administered Medications  Medication Dose Route Frequency Provider Last Rate Last Admin  . 0.9 %  sodium chloride infusion   Intravenous Continuous Elwyn Reach, MD 100 mL/hr at 07/08/19 2103 New Bag at 07/08/19 2103  . ALPRAZolam Duanne Moron) tablet 0.25 mg  0.25 mg Oral QHS PRN Elwyn Reach, MD   0.25 mg at 07/08/19 2041  . cloNIDine (CATAPRES) tablet 0.2 mg  0.2 mg Oral BID Gala Romney L, MD   0.2 mg at 07/08/19 2102  . enoxaparin (LOVENOX) injection 40 mg  40 mg Subcutaneous Q24H Garba, Mohammad L, MD      . feeding supplement (BOOST / RESOURCE BREEZE) liquid 1 Container  1 Container Oral TID BM Garba, Mohammad L, MD      . feeding supplement (ENSURE ENLIVE) (ENSURE ENLIVE) liquid 237 mL  237 mL Oral BID BM Garba, Mohammad L, MD      . irbesartan (AVAPRO) tablet 150 mg  150 mg Oral Daily Jonelle Sidle, Mohammad L, MD      . metoprolol succinate (TOPROL-XL) 24 hr tablet 25 mg  25 mg Oral Daily Garba, Mohammad L, MD      . mirtazapine (REMERON) tablet 7.5 mg  7.5 mg Oral QHS Gala Romney L, MD   7.5 mg at 07/08/19 2041  . nicotine (NICODERM CQ - dosed in mg/24 hours) patch 21 mg  21 mg Transdermal Daily Garba, Mohammad L, MD      . ondansetron (ZOFRAN) tablet 4 mg  4 mg Oral Q6H PRN Elwyn Reach, MD       Or  . ondansetron (ZOFRAN) injection 4 mg  4 mg  Intravenous Q6H PRN Gala Romney L, MD      . oxyCODONE (Oxy IR/ROXICODONE) immediate release tablet 5 mg  5 mg Oral Q6H PRN Elwyn Reach, MD   5 mg at 07/08/19 2043  . oxyCODONE (OXYCONTIN) 12 hr tablet 15 mg  15 mg Oral Q12H Elwyn Reach, MD   15 mg at 07/08/19 2102  . senna (SENOKOT) tablet 8.6 mg  1 tablet Oral Daily PRN Elwyn Reach, MD      . spironolactone (ALDACTONE) tablet 12.5 mg  12.5 mg Oral Daily Elwyn Reach, MD        Allergies as of 07/08/2019 - Review Complete 07/08/2019  Allergen Reaction Noted  . Sulfa antibiotics  09/06/2011  . Aspirin Nausea Only 04/10/2017    Family History  Problem Relation Age of Onset  . Breast cancer Neg Hx     Social History   Socioeconomic History  . Marital status: Married    Spouse name: Not on file  . Number of children: Not on file  . Years of education: Not on file  . Highest education level: Not on file  Occupational History  . Not on file  Tobacco Use  . Smoking status: Former Smoker    Types: Cigarettes    Quit date: 02/07/2019    Years since quitting: 0.4  . Smokeless tobacco: Never Used  Substance and Sexual Activity  . Alcohol use: Never  . Drug use: Never  . Sexual activity: Not Currently  Other Topics Concern  . Not on file  Social History Narrative  . Not on file   Social Determinants of Health   Financial Resource Strain:   . Difficulty of Paying Living Expenses:   Food Insecurity:   . Worried About Charity fundraiser in the Last Year:   . Arboriculturist in the Last Year:   Transportation Needs:   . Film/video editor (Medical):   Marland Kitchen Lack of Transportation (Non-Medical):   Physical Activity:   . Days of Exercise per Week:   . Minutes of Exercise per Session:   Stress:   . Feeling of Stress :   Social Connections:   . Frequency of Communication with Friends and Family:   . Frequency of Social Gatherings with Friends and Family:   . Attends Religious Services:   . Active  Member of Clubs or Organizations:   .  Attends Archivist Meetings:   Marland Kitchen Marital Status:   Intimate Partner Violence:   . Fear of Current or Ex-Partner:   . Emotionally Abused:   Marland Kitchen Physically Abused:   . Sexually Abused:     Review of Systems: Gen: Fatigue. 15lbs weight loss past year. No fever sweats or chills. CV: Denies chest pain, palpitations or edema. Resp: Persistent productive cough, sputum described as white.  No hemoptysis. GI: See HPI. GU : Denies urinary burning, blood in urine, increased urinary frequency or incontinence. MS: Right and left upper leg pain. Derm: Denies rash, itchiness, skin lesions or unhealing ulcers. Psych: Denies depression, anxiety, memory loss, suicidal ideation or confusion. Heme: Denies easy bruising, bleeding. Neuro:  Denies headaches, dizziness or paresthesias. Endo:  Denies any problems with DM, thyroid or adrenal function.  Physical Exam: Vital signs in last 24 hours: Temp:  [97.9 F (36.6 C)-100 F (37.8 C)] 99.4 F (37.4 C) (06/08 0205) Pulse Rate:  [58-120] 60 (06/08 0205) Resp:  [14-18] 14 (06/08 0205) BP: (102-124)/(65-73) 114/71 (06/08 0205) SpO2:  [95 %-100 %] 96 % (06/08 0205) Weight:  [53.3 kg] 53.3 kg (06/07 1755) Last BM Date: 07/07/19 General:  Alert fatigued appearing 74 year old female in no acute distress. Head:  Normocephalic and atraumatic. Eyes: Scleral icterus present.  Conjunctiva pink. Ears:  Normal auditory acuity. Nose:  No deformity, discharge or lesions. Mouth: Absent dentition.  Yellow coating on tongue.  No ulcers or lesions.  Neck:  Supple. No lymphadenopathy or thyromegaly.  Lungs: Sounds slightly coarse posteriorly.  Heart: Tachycardic, systolic murmur. Abdomen: Slightly protuberant, soft, nondistended.  Hepatic border area slightly firm to palpation.  Positive bowel sounds to all 4 quadrants.  No ascites.  Radiation marker to the central abdomen in place. Rectal: Deferred. Musculoskeletal:   Symmetrical without gross deformities.  Pulses:  Normal pulses noted. Extremities:  Without clubbing or edema. Neurologic:  Alert and  oriented x4. No focal deficits. Voice is hoarse.  Skin:  Intact without significant lesions or rashes.  Psych:  Alert and cooperative. Normal mood and affect.  Intake/Output from previous day: No intake/output data recorded. Intake/Output this shift: No intake/output data recorded.  Lab Results: Recent Labs    07/08/19 1219  WBC 15.9*  HGB 10.3*  HCT 30.6*  PLT 548*   BMET Recent Labs    07/08/19 1219  NA 127*  K 4.6  CL 98  CO2 19*  GLUCOSE 120*  BUN 14  CREATININE 0.89  CALCIUM 9.6   LFT Recent Labs    07/08/19 1219  PROT 8.1  ALBUMIN 2.6*  AST 363*  ALT 706*  ALKPHOS 541*  BILITOT 10.9*   PT/INR No results for input(s): LABPROT, INR in the last 72 hours. Hepatitis Panel No results for input(s): HEPBSAG, HCVAB, HEPAIGM, HEPBIGM in the last 72 hours.    Studies/Results: No results found.  IMPRESSION/PLAN:  75.  74 year old female recently diagnosed with neuroendocrine pancreatic cancer with diffuse metastasis presents with elevated T. Bili, alk phos and LFTs with leukocytosis concerning for biliary obstruction at risk for ascending cholangitis. CTAP 06/28/2019 showed a 4.3 cm pancreatic mass, a 6.1 x 4.3cm liver mass and intrahepatic biliary ductal dilatation secondary to mass effect on the common bile duct from periportal adenopathy. An abdominal sonogram 6/4 showed a 6.1 cm right hepatic liver mass, multiple masses in the pancreas and a dilated CBD at 8cm. Alk phos 541 -> 389. T. Bili 10.9 -> 11.6. AST 363 -> 191. ALT 706 ->  457. WBC 15.9 -> 13.1. Temp 100F-> 99.24F -> 97.9. HR 120 -> 60 -> 115. BP 114/71. -NPO -ERCP with possible biliary stent placement by Dr. Fuller Plan on 07/10/2019 -SARS Covid test to be completed today -Continue NS IV @ 100cc/hr -Zosyn 3.365m IV Q 8 hrs  -Ondansetron 456mIV Q 6hrs as needed  -Pain  management per the hospitalist  2.  Normocytic anemia. Hg 10.3 -> 9.7 ( Hg 9.9. on 07/02/2019).  HCT 306. MCV 81.4.    3. 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 PLT 548 -> 519.   4.  Hyponatremia. Na+ 127 -> 129.  5. CHF, LV EF 20 -25%  6. Constipation -Miralax Q HS PRN   Further recommendations per Dr. StNehemiah Settle Dorathy Daft6/09/2019, 07:24 AM    Attending Physician Note   I have taken a history, examined the patient and reviewed the chart. I agree with the Advanced Practitioner's note, impression and recommendations.   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 Mansouraty, JaArdis Hughsnd FeWindthorst ERCP with biliary stent placement tomorrow, likely needs left and right hepatic biliary system drainage which may require repeat ERCP and/or PTC  MaLucio EdwardMD FAAtlanta Surgery Center Ltdastroenterology

## 2019-07-09 NOTE — Progress Notes (Addendum)
HEMATOLOGY-ONCOLOGY PROGRESS NOTE  SUBJECTIVE: Had a low-grade fever up to 100 last evening. Denies abdominal pain, nausea, vomiting. Family reports constipation. She denies chest pain or shortness of breath. Tolerating clear liquids without difficulty.  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 Site of primary malignancy is not definitively determined, however but favored to represent right lung primary. There is also pancreatic lesion which may be an alternative primary. Tissue sampling is recommended. 2. Bulky mediastinal and right hilar adenopathy, nodal conglomerate causes narrowing of the right upper lobe bronchus, right bronchus intermedius, upper and lower lobe bronchi. Multiple right-sided pulmonary nodules, including pleural based nodules posteriorly. 3. Large liver lesion suspicious for metastasis. There is biliary dilatation which is likely secondary to mass effect on the common bile duct from periportal adenopathy. 4. Multifocal abdominal adenopathy throughout the retroperitoneum, periportal region, central mesentery. 5. Pancreatic mass measuring 4.3 cm which may be metastasis or primary malignancy. 6. Right adrenal mass and left adrenal thickening. 7. Subcutaneous metastasis to the right chest wall. Left paraspinal intramuscular metastasis at the level of L3. 8. Non oncologic findings of right renal atrophy with large nonobstructing stones in the lower right kidney.   Aortic Atherosclerosis (ICD10-I70.0) and Emphysema (ICD10-J43.9).   06/28/2019 Initial Biopsy   FINAL MICROSCOPIC DIAGNOSIS:   A. Lymph node, biopsy:  -  Metastatic neuroendocrine carcinoma, high-grade  -  See comment   COMMENT:   By immunohistochemistry, the neoplastic cells are positive for  cytokeratin 7, chromogranin, synaptic ficin and TTF-1 but negative  for  cytokeratin 20.  The proliferative rate by Ki-67 immunohistochemistry is  approximately 80%.  Overall, the findings are consistent with a  high-grade neuroendocrine carcinoma.  The differential includes large  cell neuroendocrine carcinoma.  Small cell carcinoma is less likely  given the overall cytologic appearance.   07/05/2019 Imaging   US Abdomen  IMPRESSION: 6.1 cm solitary mass seen in right hepatic lobe consistent with metastatic disease.   Multiple masses are seen involving the pancreas, with the largest measuring 3.9 cm in the pancreatic tail.   Common bile duct is mildly dilated at 8 mm concerning for distal common bile duct obstruction. Potentially this may be due to previously described masses.   Gallbladder is not visualized.   Calyceal dilatation is again noted involving the right kidney, with probable nonobstructive calculus seen in lower pole collecting system.   07/08/2019 Initial Diagnosis   High grade neuroendocrine carcinoma ([facility])      REVIEW OF SYSTEMS:   Constitutional: Has intermittent low-grade fevers, no chills Respiratory: Denies cough, dyspnea or wheezes Cardiovascular: Denies palpitation, chest discomfort Gastrointestinal: Denies abdominal pain, nausea, vomiting. Reports constipation.  Extremities: No lower extremity edema All other systems were reviewed with the patient and are negative.  I have reviewed the past medical history, past surgical history, social history and family history with the patient and they are unchanged from previous note.   PHYSICAL EXAMINATION: ECOG PERFORMANCE STATUS: 3 - Symptomatic, >50% confined to bed  Vitals:   07/09/19 0205 07/09/19 0618  BP: 114/71 139/69  Pulse: 60 (!) 115  Resp: 14 14  Temp: 99.4 F (37.4 C) 99.2 F (37.3 C)  SpO2: 96% 96%   Filed Weights   07/08/19 1755  Weight: 53.3 kg    Intake/Output from previous day: No intake/output data recorded.  GENERAL:alert, no distress and  comfortable SKIN: skin color,  texture, turgor are normal, no rashes or significant lesions LUNGS: clear to auscultation and percussion with normal breathing effort HEART: regular rate & rhythm and no murmurs and no lower extremity edema ABDOMEN:abdomen soft, non-tender and normal bowel sounds NEURO: alert & oriented x 3 with fluent speech, no focal motor/sensory deficits  LABORATORY DATA:  I have reviewed the data as listed CMP Latest Ref Rng & Units 07/09/2019 07/08/2019 07/04/2019  Glucose 70 - 99 mg/dL 107(H) 120(H) 156(H)  BUN 8 - 23 mg/dL 19 14 9   Creatinine 0.44 - 1.00 mg/dL 0.75 0.89 0.82  Sodium 135 - 145 mmol/L 129(L) 127(L) 127(L)  Potassium 3.5 - 5.1 mmol/L 4.2 4.6 4.7  Chloride 98 - 111 mmol/L 99 98 97(L)  CO2 22 - 32 mmol/L 17(L) 19(L) 19(L)  Calcium 8.9 - 10.3 mg/dL 8.5(L) 9.6 9.8  Total Protein 6.5 - 8.1 g/dL 7.6 8.1 8.3(H)  Total Bilirubin 0.3 - 1.2 mg/dL 11.6(H) 10.9(HH) 5.2(HH)  Alkaline Phos 38 - 126 U/L 389(H) 541(H) 602(H)  AST 15 - 41 U/L 191(H) 363(HH) 802(HH)  ALT 0 - 44 U/L 457(H) 706(HH) 871(HH)    Lab Results  Component Value Date   WBC 13.1 (H) 07/09/2019   HGB 9.7 (L) 07/09/2019   HCT 29.0 (L) 07/09/2019   MCV 82.4 07/09/2019   PLT 519 (H) 07/09/2019   NEUTROABS 12.8 (H) 07/08/2019    DG Chest 2 View  Result Date: 06/28/2019 CLINICAL DATA:  Shortness of breath, weakness EXAM: CHEST - 2 VIEW COMPARISON:  Radiograph 06/22/2004 FINDINGS: Irregular dense opacity is seen along the right mediastinal border and projecting over the hilum with a possible hilum overlay sign suggesting either anterior or posterior mediastinal origins. However, suspect that this process may be involving the right central airways given more peripheral wedge like opacity throughout the right lung which could reflect a postobstructive infectious or inflammatory process. Central pulmonary arteries are enlarged. Cardiac size within normal limits. No pneumothorax or visible effusion. No  acute osseous or soft tissue abnormality. Telemetry leads overlie the chest. IMPRESSION: Irregular dense opacity along the right mediastinal border and projecting over the hilum with possible involvement of the airways given more peripheral opacity which could reflect a postobstructive process. Further evaluation with cross-sectional imaging is warranted. Central pulmonary arterial enlargement noted as well. May reflect pulmonary artery hypertension. Electronically Signed   By: Lovena Le M.D.   On: 06/28/2019 01:37   CT Chest W Contrast  Result Date: 06/28/2019 CLINICAL DATA:  Mediastinal mass on x-ray. Elevated LFTs. Left back pain. EXAM: CT CHEST, ABDOMEN, AND PELVIS WITH CONTRAST TECHNIQUE: Multidetector CT imaging of the chest, abdomen and pelvis was performed following the standard protocol during bolus administration of intravenous contrast. CONTRAST:  142m OMNIPAQUE IOHEXOL 300 MG/ML  SOLN COMPARISON:  Chest radiograph earlier this day. Remote abdominal CT 1126 FINDINGS: CT CHEST FINDINGS Cardiovascular: Aortic atherosclerosis. Aortic tortuosity without aneurysm. Heart is normal in size. Coronary artery calcifications. No pericardial effusion. Extensive mediastinal adenopathy causes mass effect and narrowing of the right pulmonary arteries. No obvious intraluminal filling defect. Mediastinum/Nodes: Extensive mediastinal and right hilar adenopathy, with confluent nodal conglomerate extending from the subcarinal station to the level of the mid trachea. Lymph nodes causes mass effect and narrowing of the right upper lobe bronchus bronchus intermedius and both lower lobe bronchi. Representative measurement at the level of the carina of 6.8 x 5.3 cm, series 1, image 23. Subcarinal nodal conglomerate measures 3.4 x 5.2 cm, series 1, image 30. Prevascular nodal  mass measuring 5.4 x 2.3 cm, series 1, image 25. Left supraclavicular adenopathy measuring 2 x 2 cm, series 1, image 5. Additional adenopathy at  multiple stations. Many of these lymph nodes are partially calcified and low-density/necrotic. There is no left hilar adenopathy. There is no axillary adenopathy. Slight mass effect on the esophagus from nodal conglomerate, which is difficult to delineate in the mid distal portion. No visualized thyroid nodule. Lungs/Pleura: There are multiple right pulmonary nodules. It is difficult to delineate nodules from adenopathy in the central right lung. Adenopathy is irregular borders. Multiple branching nodules in the right upper lobe which may represent lymphangitic spread, for example series 3, image 54. There is a 10 mm medial right lower lobe pulmonary nodule, series 3, image 73. 9 mm right lower lobe nodule series 3, image 87. There is pleural based nodularity in the posterior right upper lobe that is contiguous with adenopathy. Adenopathy causes mass effect and narrowing of the right upper lobe bronchus, bronchus intermedius and right middle and lower lobe bronchi. No significant pleural effusion. No discrete left lung pulmonary nodules.  Mild emphysema. Musculoskeletal: No blastic or destructive lytic lesions. Right chest wall metastasis, series 1, image 35. CT ABDOMEN PELVIS FINDINGS Hepatobiliary: Large irregular hypodense liver lesion in the inferior liver tip measures 6.1 x 4.3 cm. There is right greater than left intrahepatic biliary ductal dilatation, likely due to mass effect from periportal adenopathy on the common bile duct. Gallbladder is not well visualized. Pancreas: Heterogeneous mass in the pancreatic body measuring 4.3 x 2.5 cm. There is no distal atrophy. Mild proximal ductal dilatation of 5 mm. Spleen: Calcified granuloma.  Splenule inferiorly. Adrenals/Urinary Tract: Nodular thickening of the left adrenal gland up to 11 mm. Heterogeneous right adrenal mass measures 3.3 x 2.6 cm, increased in size from prior exam. There is prominent right renal parenchymal thinning with calyceal dilatation of the  upper and lower poles. Large intra caliceal stones in the lower right kidney. There is no significant perinephric edema. Urinary bladder is distended but unremarkable. Stomach/Bowel: No obvious gastric mass. No small bowel obstruction or inflammatory change. High-density material throughout the colon may be contrast or bismuth containing products. Moderate stool proximally. No obvious colonic mass. Appendix is normal. Vascular/Lymphatic: Multifocal abdominal adenopathy. Periportal adenopathy with 3 x 2.3 cm lymph node, series 1, image 66. There are additional enlarged periportal nodes. Multiple enlarged upper abdominal nodes in the retroperitoneum and peripancreatic space. Representative right retroperitoneal node measures 3.0 x 1.8 cm, series 1, image 65. There is an enlarged central mesenteric node at 9 mm, series 1, image 75. Right retroperitoneal adenopathy causes mild mass effect on the IVC. There is a retrocrural node adjacent to the distal aorta. No pelvic adenopathy. Aortic and branch atherosclerosis. The portal vein is attenuated with mass effect from adjacent adenopathy. Reproductive: The uterus is not seen.  No obvious adnexal mass. Other: No ascites or omental thickening.  No free air. Musculoskeletal: Left paraspinal met measures 3.1 x 2.2 cm, series 168 at the level of L3-L4. No evidence of blastic or destructive lytic lesion. Scoliotic curvature of the spine. IMPRESSION: 1. 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 Site of primary malignancy is not definitively determined, however but favored to represent right lung primary. There is also pancreatic lesion which may be an alternative primary. Tissue sampling is recommended. 2. Bulky mediastinal and right hilar adenopathy, nodal conglomerate causes narrowing of the right upper lobe bronchus, right bronchus intermedius, upper and lower lobe bronchi. Multiple  right-sided pulmonary nodules, including pleural based nodules  posteriorly. 3. Large liver lesion suspicious for metastasis. There is biliary dilatation which is likely secondary to mass effect on the common bile duct from periportal adenopathy. 4. Multifocal abdominal adenopathy throughout the retroperitoneum, periportal region, central mesentery. 5. Pancreatic mass measuring 4.3 cm which may be metastasis or primary malignancy. 6. Right adrenal mass and left adrenal thickening. 7. Subcutaneous metastasis to the right chest wall. Left paraspinal intramuscular metastasis at the level of L3. 8. Non oncologic findings of right renal atrophy with large nonobstructing stones in the lower right kidney. Aortic Atherosclerosis (ICD10-I70.0) and Emphysema (ICD10-J43.9). Electronically Signed   By: Keith Rake M.D.   On: 06/28/2019 03:46   US Abdomen Complete  Result Date: 07/05/2019 CLINICAL DATA:  Hyperbilirubinemia. EXAM: ABDOMEN ULTRASOUND COMPLETE COMPARISON:  Jun 28, 2019. FINDINGS: Gallbladder: Not visualized. Common bile duct: Diameter: 8 mm which is mildly dilated concerning for possible distal common bile duct obstruction. Liver: Large solitary mass measuring 6.1 cm is noted in right hepatic lobe consistent with metastatic disease. Within normal limits in parenchymal echogenicity. Portal vein is patent on color Doppler imaging with normal direction of blood flow towards the liver. IVC: No abnormality visualized. Pancreas: Multiple masses are noted within the pancreas as described on prior CT scan, the largest measuring 3.9 x 3.6 x 2.6 cm in the pancreatic tail. Spleen: Calcification is noted most consistent with calcified splenic granuloma. No other abnormality is noted. Right Kidney: Length: 11.1 cm. Increased echogenicity of renal parenchyma is noted. Calyceal dilatation is noted throughout the right kidney as described on prior CT scan. No significant pelvic dilatation is noted. 1.5 cm nonobstructive calculus is noted in lower pole. Left Kidney: Length: 11.6 cm.  Increased echogenicity is noted consistent with medical renal disease. No mass or hydronephrosis visualized. Abdominal aorta: No aneurysm visualized. Other findings: None. IMPRESSION: 6.1 cm solitary mass seen in right hepatic lobe consistent with metastatic disease. Multiple masses are seen involving the pancreas, with the largest measuring 3.9 cm in the pancreatic tail. Common bile duct is mildly dilated at 8 mm concerning for distal common bile duct obstruction. Potentially this may be due to previously described masses. Gallbladder is not visualized. Calyceal dilatation is again noted involving the right kidney, with probable nonobstructive calculus seen in lower pole collecting system. Electronically Signed   By: Marijo Conception M.D.   On: 07/05/2019 12:56   CT ABDOMEN PELVIS W CONTRAST  Result Date: 06/28/2019 CLINICAL DATA:  Mediastinal mass on x-ray. Elevated LFTs. Left back pain. EXAM: CT CHEST, ABDOMEN, AND PELVIS WITH CONTRAST TECHNIQUE: Multidetector CT imaging of the chest, abdomen and pelvis was performed following the standard protocol during bolus administration of intravenous contrast. CONTRAST:  13m OMNIPAQUE IOHEXOL 300 MG/ML  SOLN COMPARISON:  Chest radiograph earlier this day. Remote abdominal CT 1126 FINDINGS: CT CHEST FINDINGS Cardiovascular: Aortic atherosclerosis. Aortic tortuosity without aneurysm. Heart is normal in size. Coronary artery calcifications. No pericardial effusion. Extensive mediastinal adenopathy causes mass effect and narrowing of the right pulmonary arteries. No obvious intraluminal filling defect. Mediastinum/Nodes: Extensive mediastinal and right hilar adenopathy, with confluent nodal conglomerate extending from the subcarinal station to the level of the mid trachea. Lymph nodes causes mass effect and narrowing of the right upper lobe bronchus bronchus intermedius and both lower lobe bronchi. Representative measurement at the level of the carina of 6.8 x 5.3 cm,  series 1, image 23. Subcarinal nodal conglomerate measures 3.4 x 5.2 cm, series 1, image 30.  Prevascular nodal mass measuring 5.4 x 2.3 cm, series 1, image 25. Left supraclavicular adenopathy measuring 2 x 2 cm, series 1, image 5. Additional adenopathy at multiple stations. Many of these lymph nodes are partially calcified and low-density/necrotic. There is no left hilar adenopathy. There is no axillary adenopathy. Slight mass effect on the esophagus from nodal conglomerate, which is difficult to delineate in the mid distal portion. No visualized thyroid nodule. Lungs/Pleura: There are multiple right pulmonary nodules. It is difficult to delineate nodules from adenopathy in the central right lung. Adenopathy is irregular borders. Multiple branching nodules in the right upper lobe which may represent lymphangitic spread, for example series 3, image 54. There is a 10 mm medial right lower lobe pulmonary nodule, series 3, image 73. 9 mm right lower lobe nodule series 3, image 87. There is pleural based nodularity in the posterior right upper lobe that is contiguous with adenopathy. Adenopathy causes mass effect and narrowing of the right upper lobe bronchus, bronchus intermedius and right middle and lower lobe bronchi. No significant pleural effusion. No discrete left lung pulmonary nodules.  Mild emphysema. Musculoskeletal: No blastic or destructive lytic lesions. Right chest wall metastasis, series 1, image 35. CT ABDOMEN PELVIS FINDINGS Hepatobiliary: Large irregular hypodense liver lesion in the inferior liver tip measures 6.1 x 4.3 cm. There is right greater than left intrahepatic biliary ductal dilatation, likely due to mass effect from periportal adenopathy on the common bile duct. Gallbladder is not well visualized. Pancreas: Heterogeneous mass in the pancreatic body measuring 4.3 x 2.5 cm. There is no distal atrophy. Mild proximal ductal dilatation of 5 mm. Spleen: Calcified granuloma.  Splenule inferiorly.  Adrenals/Urinary Tract: Nodular thickening of the left adrenal gland up to 11 mm. Heterogeneous right adrenal mass measures 3.3 x 2.6 cm, increased in size from prior exam. There is prominent right renal parenchymal thinning with calyceal dilatation of the upper and lower poles. Large intra caliceal stones in the lower right kidney. There is no significant perinephric edema. Urinary bladder is distended but unremarkable. Stomach/Bowel: No obvious gastric mass. No small bowel obstruction or inflammatory change. High-density material throughout the colon may be contrast or bismuth containing products. Moderate stool proximally. No obvious colonic mass. Appendix is normal. Vascular/Lymphatic: Multifocal abdominal adenopathy. Periportal adenopathy with 3 x 2.3 cm lymph node, series 1, image 66. There are additional enlarged periportal nodes. Multiple enlarged upper abdominal nodes in the retroperitoneum and peripancreatic space. Representative right retroperitoneal node measures 3.0 x 1.8 cm, series 1, image 65. There is an enlarged central mesenteric node at 9 mm, series 1, image 75. Right retroperitoneal adenopathy causes mild mass effect on the IVC. There is a retrocrural node adjacent to the distal aorta. No pelvic adenopathy. Aortic and branch atherosclerosis. The portal vein is attenuated with mass effect from adjacent adenopathy. Reproductive: The uterus is not seen.  No obvious adnexal mass. Other: No ascites or omental thickening.  No free air. Musculoskeletal: Left paraspinal met measures 3.1 x 2.2 cm, series 168 at the level of L3-L4. No evidence of blastic or destructive lytic lesion. Scoliotic curvature of the spine. IMPRESSION: 1. 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 Site of primary malignancy is not definitively determined, however but favored to represent right lung primary. There is also pancreatic lesion which may be an alternative primary. Tissue sampling is recommended.  2. Bulky mediastinal and right hilar adenopathy, nodal conglomerate causes narrowing of the right upper lobe bronchus, right bronchus intermedius, upper and lower lobe  bronchi. Multiple right-sided pulmonary nodules, including pleural based nodules posteriorly. 3. Large liver lesion suspicious for metastasis. There is biliary dilatation which is likely secondary to mass effect on the common bile duct from periportal adenopathy. 4. Multifocal abdominal adenopathy throughout the retroperitoneum, periportal region, central mesentery. 5. Pancreatic mass measuring 4.3 cm which may be metastasis or primary malignancy. 6. Right adrenal mass and left adrenal thickening. 7. Subcutaneous metastasis to the right chest wall. Left paraspinal intramuscular metastasis at the level of L3. 8. Non oncologic findings of right renal atrophy with large nonobstructing stones in the lower right kidney. Aortic Atherosclerosis (ICD10-I70.0) and Emphysema (ICD10-J43.9). Electronically Signed   By: Keith Rake M.D.   On: 06/28/2019 03:46   ECHOCARDIOGRAM COMPLETE  Result Date: 06/28/2019    ECHOCARDIOGRAM REPORT   Patient Name:   Alisha Goodman Date of Exam: 06/28/2019 Medical Rec #:  [id number]       Height:       65.0 in Accession #:    [id number]      Weight:       129.0 lb Date of Birth:  08/31/1949        BSA:          1.642 m Patient Age:    27 years        BP:           133/81 mmHg Patient Gender: F               HR:           115 bpm. Exam Location:  Inpatient Procedure: 2D Echo, Cardiac Doppler, Color Doppler and Intracardiac            Opacification Agent Indications:    Chest Pain 786.50 / R07.9  History:        Patient has no prior history of Echocardiogram examinations.                 Risk Factors:Former Smoker.  Sonographer:    Vickie Epley RDCS Referring Phys: [id number] [facility] A THOMAS  Sonographer Comments: Suboptimal parasternal window. IMPRESSIONS  1. No apical thrombus by Definity contrast. Left ventricular ejection  fraction, by estimation, is 20 to 25%. The left ventricle has severely decreased function. The left ventricle demonstrates global hypokinesis. The left ventricular internal cavity size was mildly dilated. indeterminate diastolic function, due to what appears to be atrial flutter.  2. Right ventricular systolic function is hyperdynamic. The right ventricular size is normal.  3. The mitral valve is grossly normal. No evidence of mitral valve regurgitation.  4. The aortic valve was not well visualized. Aortic valve regurgitation is not visualized. Mild aortic valve sclerosis is present, with no evidence of aortic valve stenosis.  5. The inferior vena cava is normal in size with greater than 50% respiratory variability, suggesting right atrial pressure of 3 mmHg. Conclusion(s)/Recommendation(s): Rhythm appears to be atrial flutter, I have also reviewed the EKG which demonstrates flutter waves. FINDINGS  Left Ventricle: No apical thrombus by Definity contrast. Left ventricular ejection fraction, by estimation, is 20 to 25%. The left ventricle has severely decreased function. The left ventricle demonstrates global hypokinesis. Definity contrast agent was  given IV to delineate the left ventricular endocardial borders. The left ventricular internal cavity size was mildly dilated. There is no left ventricular hypertrophy. Left ventricular diastolic function could not be evaluated due to possible atrial flutter. Indeterminate diastolic function, due to what appears to be atrial flutter. Right Ventricle: The right ventricular  size is normal. No increase in right ventricular wall thickness. Right ventricular systolic function is hyperdynamic. Left Atrium: Left atrial size was normal in size. Right Atrium: Right atrial size was normal in size. Pericardium: There is no evidence of pericardial effusion. Mitral Valve: The mitral valve is grossly normal. No evidence of mitral valve regurgitation. Tricuspid Valve: The tricuspid  valve is grossly normal. Tricuspid valve regurgitation is not demonstrated. Aortic Valve: The aortic valve was not well visualized. Aortic valve regurgitation is not visualized. Mild aortic valve sclerosis is present, with no evidence of aortic valve stenosis. Pulmonic Valve: The pulmonic valve was normal in structure. Pulmonic valve regurgitation is not visualized. Aorta: The aortic root and ascending aorta are structurally normal, with no evidence of dilitation. Venous: The inferior vena cava is normal in size with greater than 50% respiratory variability, suggesting right atrial pressure of 3 mmHg. IAS/Shunts: No atrial level shunt detected by color flow Doppler.  LEFT VENTRICLE PLAX 2D LVIDd:         4.04 cm LVIDs:         3.62 cm LV PW:         0.76 cm LV IVS:        0.77 cm LVOT diam:     1.90 cm LV SV:         48 LV SV Index:   29 LVOT Area:     2.84 cm  LV Volumes (MOD) LV vol d, MOD A2C: 71.0 ml LV vol d, MOD A4C: 68.8 ml LV vol s, MOD A2C: 49.9 ml LV vol s, MOD A4C: 60.4 ml LV SV MOD A2C:     21.1 ml LV SV MOD A4C:     68.8 ml LV SV MOD BP:      14.9 ml RIGHT VENTRICLE TAPSE (M-mode): 2.2 cm LEFT ATRIUM             Index       RIGHT ATRIUM          Index LA diam:        2.50 cm 1.52 cm/m  RA Area:     8.27 cm LA Vol (A2C):   31.1 ml 18.94 ml/m RA Volume:   15.30 ml 9.32 ml/m LA Vol (A4C):   18.0 ml 10.96 ml/m LA Biplane Vol: 23.7 ml 14.44 ml/m  AORTIC VALVE LVOT Vmax:   113.00 cm/s LVOT Vmean:  73.900 cm/s LVOT VTI:    0.169 m  AORTA Ao Root diam: 2.90 cm  SHUNTS Systemic VTI:  0.17 m Systemic Diam: 1.90 cm Lyman Bishop MD Electronically signed by Lyman Bishop MD Signature Date/Time: 06/28/2019/3:34:15 PM    Final    [place] CORE BIOPSY (LYMPH NODES)  Result Date: 06/28/2019 INDICATION: 74 year old female with a 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 ANESTHESIA/SEDATION: None  FLUOROSCOPY TIME:  None COMPLICATIONS: None PROCEDURE: Informed written consent was obtained from the patient after a thorough discussion of the procedural risks, benefits and alternatives. All questions were addressed. Maximal Sterile Barrier Technique was utilized including caps, mask, sterile gowns, sterile gloves, sterile drape, hand hygiene and skin antiseptic. A timeout was performed prior to the initiation of the procedure. Ultrasound survey was performed with images stored and sent to PACs. The left neck was prepped with chlorhexidine in a sterile fashion, and a sterile drape was applied covering the operative field. A sterile gown and sterile gloves were used for the procedure.  Local anesthesia was provided with 1% Lidocaine. Ultrasound guidance was used to infiltrate the region with 1% lidocaine for local anesthesia. A small stab incision was made with 11 blade scalpel. Using ultrasound guidance, multiple 18 gauge core biopsy were acquired of the pathologic left supraclavicular node. These were placed in day fresh specimen container. Final image was stored after biopsy. Patient tolerated the procedure well and remained hemodynamically stable throughout. No complications were encountered and no significant blood loss was encounter IMPRESSION: Status post ultrasound-guided biopsy of left supraclavicular pathologic node. Signed, Dulcy Fanny. Dellia Nims, RPVI [facility] Electronically Signed   By: Corrie Mckusick D.O.   On: 06/28/2019 17:11    ASSESSMENT AND PLAN: 1. 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 Jaundice secondary to external compression from portal adenopathy and pancreatic mass 3. Left hip and thigh pain related to her left paraspinal metastatic lesion 4. Weight loss and poor appetite 5. Constipation 6. Leukocytosis 7. Anemia  -Prognosis from  a cancer standpoint is poor, there is a high likelihood that she may not be able to get any systemic chemotherapy due to poor performance status and liver dysfunction. Will readdress treatment options once cholangitis treated. -LFTs and total bilirubin are trending upward. The patient was seen by GI with plans for ERCP on 09/09/2019. Continue IV antibiotics. -Okay to continue radiation to her paraspinal mass for palliation of pain. -Recommend dietitian consult. Continue Remeron for appetite stimulation. -We will start the patient on Senokot-S 2 tablets in the morning and 1 tablet at bedtime. -WBC is trending downward. Continue to monitor. -Hemoglobin slowly trending downward. We will continue to monitor this.   LOS: 1 day   Mikey Bussing, DNP, AGPCNP-BC, AOCNP 07/09/19  ADDENDUM  I have seen the patient, examined her. I agree with the assessment and and plan and have edited the notes.   Mrs Underwent ERCP with stent placement yesterday, overall feels better today.  She has been eating better.  She may go home tomorrow.  She will get radiation treatment today.  Her liver function showed improved hyperbilirubinemia.  To see her back later next week in my office for follow-up, will repeat lab then.   Pt is interested in palliative care service, will refer on Monday.  Truitt Merle  07/12/2019

## 2019-07-09 NOTE — Progress Notes (Signed)
Initial Nutrition Assessment  DOCUMENTATION CODES:   Non-severe (moderate) malnutrition in context of chronic illness  INTERVENTION:   -Boost Breeze po TID, each supplement provides 250 kcal and 9 grams of protein  NUTRITION DIAGNOSIS:   Moderate Malnutrition related to chronic illness, cancer and cancer related treatments as evidenced by percent weight loss, energy intake < or equal to 75% for > or equal to 1 month, moderate muscle depletion, moderate fat depletion.  GOAL:   Patient will meet greater than or equal to 90% of their needs  MONITOR:   PO intake, Supplement acceptance, Labs, Weight trends, I & O's  REASON FOR ASSESSMENT:   Malnutrition Screening Tool    ASSESSMENT:   74 y.o. 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  Patient has been referred to GI and was scheduled for ERCP with possible stenting on June 14. Admitted for obstructive jaundice.  Patient in room with daughter at bedside. Pt very soft spoken but able to answer nutrition related questions. Pt reports poor appetite for a few weeks. Pt states she has mainly been consuming liquids such as broths, soups, jello, coffee and 1-2 Ensures daily. 1-1.5 weeks ago pt was eating some vegetables and applesauce, this was the last solid foods she has eaten. Pt currently on clear liquids, expected to have ERCP with possible biliary stent placement 6/9. Provided pt with a Boost Breeze to try and states she likes them.   Per pt, UBW was 137-140 lbs. Pt reports 15 lbs of weight loss over the past few months. Pt has lost 20 lbs (this is a 14% wt loss x 2-3 months, significant for time frame).   Medications: Remeron, Senokot  Labs reviewed: Low Na  NUTRITION - FOCUSED PHYSICAL EXAM:    Most  Recent Value  Orbital Region  No depletion  Upper Arm Region  Moderate depletion  Thoracic and Lumbar Region  Unable to assess  Buccal Region  Mild depletion  Temple Region  Mild depletion  Clavicle Bone Region  Mild depletion  Clavicle and Acromion Bone Region  Mild depletion  Scapular Bone Region  Unable to assess  Dorsal Hand  Moderate depletion  Patellar Region  Unable to assess  Anterior Thigh Region  Unable to assess  Posterior Calf Region  Unable to assess [c/o leg pain]       Diet Order:   Diet Order            Diet NPO time specified  Diet effective midnight        Diet clear liquid Room service appropriate? Yes; Fluid consistency: Thin  Diet effective now              EDUCATION NEEDS:   Education needs have been addressed  Skin:  Skin Assessment: Reviewed RN Assessment  Last BM:  6/6 -type 5  Height:   Ht Readings from Last 1 Encounters:  07/08/19 5\' 5"  (1.651 m)    Weight:   Wt Readings from Last 1 Encounters:  07/08/19 53.3 kg   BMI:  Body mass index is 19.57 kg/m.  Estimated Nutritional Needs:   Kcal:  9833-8250  Protein:  90-105g  Fluid:  1.9L/day   Clayton Bibles, MS, RD, LDN Inpatient Clinical Dietitian Contact information available via Amion

## 2019-07-09 NOTE — Telephone Encounter (Signed)
appt cancelled with ENDO

## 2019-07-09 NOTE — Telephone Encounter (Signed)
----- Message from Irving Copas., MD sent at 07/08/2019  5:07 PM EDT ----- Looks like we won't have to get this patient in next week. FYI. Thanks. GM ----- Message ----- From: Truitt Merle, MD Sent: 07/08/2019   5:03 PM EDT To: Milus Banister, MD, Ladene Artist, MD, #  Hi Malcom,  I saw her today, her tbil went up to 10.9 today, and new leukocytosis, no fever or abdominal pain. She agreed with hospital admission. She had to wait for bed at [place], and is being admitted now. Please see her tomorrow.   I will f/u her in hospital.   Gabe, you may cancel her appointment with you next Monday.  Thanks much everyone,  Krista Blue  ----- Message ----- From: Milus Banister, MD Sent: 07/08/2019   6:35 AM EDT To: Ladene Artist, MD, Alla Feeling, NP, #  All,  I am overbooked this Thursday the 10th. I can help her next Thursday the 17th.  I am going to forward this to Dr. Fuller Plan who is covering inpatients this week and also does ERCPs, he may be able to help her before GM on Monday the 14th.   Norberto Sorenson See above and below.   ----- Message ----- From: Irving Copas., MD Sent: 07/08/2019   3:11 AM EDT To: Milus Banister, MD, Alla Feeling, NP, #  YF, If progressive LFT abnormalities or fevers or chills or significant pain worsening, then agree with inpatient evaluation. Thanks. GM ----- Message ----- From: Truitt Merle, MD Sent: 07/07/2019   9:53 PM EDT To: Milus Banister, MD, Alla Feeling, NP, #  Gabe,  Thanks much for getting her in. I plan to see her tomorrow and will discuss the procedure with her and keep you posted.   Santiago Glad, please move her appointment with me to 4pm tomorrow with lab first. Let pt's husband or daughter know that I have spoken with GI and will manage this as output, no plan for hospital admission tomorrow, unless her condition has significantly changed.   Thanks  Krista Blue  ----- Message ----- From: Irving Copas., MD Sent: 07/07/2019  12:43 PM  EDT To: Milus Banister, MD, Kyung Rudd, MD, #  Looked at the images. I think the Lns in the porta are probably causing an extrinsic obstruction but the Pancreatic body lesion could be hitting there as well. Since there is some ductal dilation bilaterally, we could consider/try an ERCP and see if we could get stents on both sides to see if that helps. I don't have availability this week, though there could be a spot on 6/14 if we make some moves with my other patients. DJ and I can discuss about our schedule to see what could work separately. Ramal Eckhardt, please see if we can get her on my 6/14 date and start my day at 63.  We can push the other procedures to fit this one in. Thanks. GM ----- Message ----- From: Truitt Merle, MD Sent: 07/07/2019  12:09 AM EDT To: Milus Banister, MD, Kyung Rudd, MD, #  Hi Dan and Chester Holstein,  Could you do me a favor to review her recent CT and abdominal US to see if she is a candidate for biliary stent placement?  She has newly diagnosed high-grade neuroendocrine tumor from pancreas, with diffuse metastasis.  Her tbil is increased significantly in the past week, and I would not be able to give chemo if her LFTs continue worsen.  Jenny Reichmann, I  think she is going to do poorly, may go to hospice soon. Do you want to shorten her radiation course?   Thanks much,  Krista Blue

## 2019-07-09 NOTE — Progress Notes (Signed)
   07/09/19 0949  Assess: MEWS Score  Temp 98.3 F (36.8 C)  BP 139/78  Pulse Rate (!) 117  Resp 16  SpO2 97 %   Patient stable.  Will increase VS per policy.

## 2019-07-10 ENCOUNTER — Inpatient Hospital Stay (HOSPITAL_COMMUNITY): Payer: Medicare (Managed Care)

## 2019-07-10 ENCOUNTER — Inpatient Hospital Stay (HOSPITAL_COMMUNITY): Payer: Medicare (Managed Care) | Admitting: Anesthesiology

## 2019-07-10 ENCOUNTER — Ambulatory Visit
Admission: RE | Admit: 2019-07-10 | Discharge: 2019-07-10 | Disposition: A | Discharge status: Home / Self Care | Payer: Medicare (Managed Care) | Source: Ambulatory Visit | Attending: Radiation Oncology | Admitting: Radiation Oncology

## 2019-07-10 ENCOUNTER — Ambulatory Visit: Payer: Medicare (Managed Care)

## 2019-07-10 ENCOUNTER — Encounter (HOSPITAL_COMMUNITY): Payer: Self-pay | Admitting: Internal Medicine

## 2019-07-10 ENCOUNTER — Encounter (HOSPITAL_COMMUNITY)
Admission: AD | Disposition: A | Discharge status: Home / Self Care | Payer: Self-pay | Source: Ambulatory Visit | Attending: Internal Medicine

## 2019-07-10 ENCOUNTER — Encounter: Payer: PRIVATE HEALTH INSURANCE | Admitting: Physician Assistant

## 2019-07-10 DIAGNOSIS — I5022 Chronic systolic (congestive) heart failure: Secondary | ICD-10-CM

## 2019-07-10 DIAGNOSIS — E871 Hypo-osmolality and hyponatremia: Secondary | ICD-10-CM

## 2019-07-10 DIAGNOSIS — C7A1 Malignant poorly differentiated neuroendocrine tumors: Secondary | ICD-10-CM

## 2019-07-10 DIAGNOSIS — E440 Moderate protein-calorie malnutrition: Secondary | ICD-10-CM

## 2019-07-10 LAB — BASIC METABOLIC PANEL
Anion gap: 12 (ref 5–15)
BUN: 11 mg/dL (ref 8–23)
CO2: 17 mmol/L — ABNORMAL LOW (ref 22–32)
Calcium: 8.4 mg/dL — ABNORMAL LOW (ref 8.9–10.3)
Chloride: 101 mmol/L (ref 98–111)
Creatinine, Ser: 0.47 mg/dL (ref 0.44–1.00)
GFR calc Af Amer: >60 mL/min (ref >60)
GFR calc non Af Amer: >60 mL/min (ref >60)
Glucose, Bld: 107 mg/dL — ABNORMAL HIGH (ref 70–99)
Potassium: 4.1 mmol/L (ref 3.5–5.1)
Sodium: 130 mmol/L — ABNORMAL LOW (ref 135–145)

## 2019-07-10 LAB — CBC WITH DIFFERENTIAL/PLATELET
Abs Immature Granulocytes: 0.09 10*3/uL — ABNORMAL HIGH (ref 0.00–0.07)
Basophils Absolute: 0 10*3/uL (ref 0.0–0.1)
Basophils Relative: 0 %
Eosinophils Absolute: 0 10*3/uL (ref 0.0–0.5)
Eosinophils Relative: 0 %
HCT: 27.5 % — ABNORMAL LOW (ref 36.0–46.0)
Hemoglobin: 9.2 g/dL — ABNORMAL LOW (ref 12.0–15.0)
Immature Granulocytes: 1 %
Lymphocytes Relative: 7 %
Lymphs Abs: 0.7 10*3/uL (ref 0.7–4.0)
MCH: 27.2 pg (ref 26.0–34.0)
MCHC: 33.5 g/dL (ref 30.0–36.0)
MCV: 81.4 fL (ref 80.0–100.0)
Monocytes Absolute: 0.8 10*3/uL (ref 0.1–1.0)
Monocytes Relative: 7 %
Neutro Abs: 8.8 10*3/uL — ABNORMAL HIGH (ref 1.7–7.7)
Neutrophils Relative %: 85 %
Platelets: 521 10*3/uL — ABNORMAL HIGH (ref 150–400)
RBC: 3.38 MIL/uL — ABNORMAL LOW (ref 3.87–5.11)
RDW: 17 % — ABNORMAL HIGH (ref 11.5–15.5)
WBC: 10.3 10*3/uL (ref 4.0–10.5)
nRBC: 0 % (ref 0.0–0.2)

## 2019-07-10 LAB — HEPATIC FUNCTION PANEL
ALT: 311 U/L — ABNORMAL HIGH (ref 0–44)
AST: 155 U/L — ABNORMAL HIGH (ref 15–41)
Albumin: 2.6 g/dL — ABNORMAL LOW (ref 3.5–5.0)
Alkaline Phosphatase: 326 U/L — ABNORMAL HIGH (ref 38–126)
Bilirubin, Direct: 7.7 mg/dL — ABNORMAL HIGH (ref 0.0–0.2)
Indirect Bilirubin: 4.2 mg/dL — ABNORMAL HIGH (ref 0.3–0.9)
Total Bilirubin: 11.9 mg/dL — ABNORMAL HIGH (ref 0.3–1.2)
Total Protein: 7.2 g/dL (ref 6.5–8.1)

## 2019-07-10 LAB — PROTIME-INR
INR: 1.2 (ref 0.8–1.2)
Prothrombin Time: 14.5 seconds (ref 11.4–15.2)

## 2019-07-10 LAB — MRSA PCR SCREENING: MRSA by PCR: NEGATIVE

## 2019-07-10 LAB — GLUCOSE, CAPILLARY: Glucose-Capillary: 96 mg/dL (ref 70–99)

## 2019-07-10 SURGERY — CANCELLED PROCEDURE
Anesthesia: General

## 2019-07-10 MED ORDER — PROPOFOL 10 MG/ML IV BOLUS
INTRAVENOUS | Status: AC
  Filled 2019-07-10: qty 20

## 2019-07-10 MED ORDER — GUAIFENESIN ER 600 MG PO TB12
600.0000 mg | ORAL_TABLET | Freq: Two times a day (BID) | ORAL | Status: DC
  Administered 2019-07-10 – 2019-07-13 (×7): 600 mg via ORAL
  Filled 2019-07-10 (×7): qty 1

## 2019-07-10 MED ORDER — ENOXAPARIN SODIUM 30 MG/0.3ML ~~LOC~~ SOLN
30.0000 mg | SUBCUTANEOUS | Status: DC
  Administered 2019-07-10: 30 mg via SUBCUTANEOUS
  Filled 2019-07-10: qty 0.3

## 2019-07-10 MED ORDER — MORPHINE SULFATE (PF) 2 MG/ML IV SOLN
2.0000 mg | INTRAVENOUS | Status: DC | PRN
  Administered 2019-07-10 – 2019-07-13 (×9): 2 mg via INTRAVENOUS
  Filled 2019-07-10 (×9): qty 1

## 2019-07-10 MED ORDER — IRBESARTAN 150 MG PO TABS
150.0000 mg | ORAL_TABLET | Freq: Every day | ORAL | Status: DC
  Administered 2019-07-10 – 2019-07-13 (×4): 150 mg via ORAL
  Filled 2019-07-10 (×4): qty 1

## 2019-07-10 MED ORDER — INDOMETHACIN 50 MG RE SUPP
RECTAL | Status: AC
  Filled 2019-07-10: qty 2

## 2019-07-10 MED ORDER — MORPHINE SULFATE (PF) 2 MG/ML IV SOLN
1.0000 mg | INTRAVENOUS | Status: DC | PRN
  Administered 2019-07-10 (×2): 1 mg via INTRAVENOUS
  Filled 2019-07-10 (×3): qty 1

## 2019-07-10 MED ORDER — BENZONATATE 100 MG PO CAPS
100.0000 mg | ORAL_CAPSULE | Freq: Three times a day (TID) | ORAL | Status: DC | PRN
  Administered 2019-07-10 – 2019-07-11 (×2): 100 mg via ORAL
  Filled 2019-07-10 (×2): qty 1

## 2019-07-10 MED ORDER — FENTANYL CITRATE (PF) 100 MCG/2ML IJ SOLN
INTRAMUSCULAR | Status: AC
  Filled 2019-07-10: qty 2

## 2019-07-10 MED ORDER — GLUCAGON HCL RDNA (DIAGNOSTIC) 1 MG IJ SOLR
INTRAMUSCULAR | Status: AC
  Filled 2019-07-10: qty 1

## 2019-07-10 NOTE — Anesthesia Preprocedure Evaluation (Addendum)
Anesthesia Evaluation  Patient identified by MRN, date of birth, ID band Patient awake    Reviewed: Allergy & Precautions, H&P , NPO status , Patient's Chart, lab work & pertinent test results, reviewed documented beta blocker date and time   Airway Mallampati: II  TM Distance: >3 FB Neck ROM: full    Dental no notable dental hx. (+) Edentulous Upper, Edentulous Lower   Pulmonary shortness of breath and at rest, COPD, former smoker,    Pulmonary exam normal breath sounds clear to auscultation       Cardiovascular Exercise Tolerance: Good hypertension, Pt. on medications and Pt. on home beta blockers +CHF  + dysrhythmias Atrial Fibrillation  Rhythm:regular Rate:Normal  ECHO 5/21 No apical thrombus by Definity contrast. Left ventricular ejection  fraction, by estimation, is 20 to 25%. The left ventricle has severely  decreased function. The left ventricle demonstrates global hypokinesis.  The left ventricular internal cavity  size was mildly dilated. indeterminate diastolic function, due to what  appears to be atrial flutter.    Neuro/Psych  Neuromuscular disease negative psych ROS   GI/Hepatic negative GI ROS, Neg liver ROS,   Endo/Other  negative endocrine ROS  Renal/GU negative Renal ROS  negative genitourinary   Musculoskeletal   Abdominal   Peds  Hematology negative hematology ROS (+)   Anesthesia Other Findings   Reproductive/Obstetrics negative OB ROS                           Anesthesia Physical Anesthesia Plan  ASA: IV  Anesthesia Plan: General   Post-op Pain Management:    Induction: Intravenous  PONV Risk Score and Plan: 3 and Ondansetron and Treatment may vary due to age or medical condition  Airway Management Planned: Oral ETT  Additional Equipment:   Intra-op Plan:   Post-operative Plan: Extubation in OR  Informed Consent: I have reviewed the patients History  and Physical, chart, labs and discussed the procedure including the risks, benefits and alternatives for the proposed anesthesia with the patient or authorized representative who has indicated his/her understanding and acceptance.     Dental Advisory Given  Plan Discussed with: CRNA and Anesthesiologist  Anesthesia Plan Comments: (  Acute systolic heart failure - new diagnosis.  She has been followed by primary care provider.  She was surprised to learn of this diagnosis of probable metastatic cancer.  Additionally, there is new reduction in LVEF to 20 to 25%.  This appears to be global hypokinesis and I suspect is nonischemic.  On admission she was not tachycardic rather in a normal sinus rhythm therefore it is unlikely to be tachycardia mediated.        -Agree with metoprolol for rate control and treatment of cardiomyopathy             -Would stop Norvasc and add irbesartan, possibly switch to Entresto later             - Consider ischemia evaluation with stress testing, however, suspect this is              NICM and she is not likely a candidate for revascularization 2. Possible atrial flutter vs EAT / NSVT- rhythm appeared initially to be flutter, however, the morphology of the tachycardia very similar to sinus rhythm - could be sinus tach, although not clear why HR just increased yesterday - also noted to have NSVT. Suspect this is an ectopic atrial rhythm. -On metoprolol 25 mg BID -  may increase further for rate control  -Consider escalating anticoagulation from prophylaxis to treatment if flutter is confirmed.  3.   Metastatic cancer - unknown primary, work-up is underway per oncology -       suspect lung or pancreas. 4.   Essential hypertension - stop amlodipine, change to irbesartan - metoprolol,       continue clonidine. )       Anesthesia Quick Evaluation

## 2019-07-10 NOTE — Progress Notes (Signed)
Patient evaluated pre procedure by Dr Laurann Montana canceled for today.

## 2019-07-10 NOTE — Progress Notes (Signed)
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 Patient has been referred to GI and was scheduled for ERCP with possible stenting on June 14. She was seen today in the cancer center with worsening symptoms. She got IV fluids. Was able to tolerate some juice and milk as well as some oatmeal. Patient however is deteriorating. She was sent over to the hospital at this point for admission evaluation and possible ERCP. Dr. Rush Landmark and GI because of GI aware. She has had rapid worsening of her LFTs and also new leukocytosis. There is worried that she may be developing acute cholangitis. She is also scheduled for palliative radiation with Dr. Lisbeth Renshaw which will have started today. Prognosis is noted to be poor and family fully informed. We are admitting the patient with oncology to follow together with GI   07/10/19: She was unable to complete her ERCP d/t respiratory issues this AM. CXR shows possible superimposed opacity on chronic interstitial disease. If we were to call this HAP, she is already on zosyn. Check MRSA swap to see if we need to add vanc. Add guaifenesin and tesslon.   Assessment & Plan:   Principal Problem:   Obstructive jaundice Active Problems:   Atrial flutter (HCC)   Hyponatremia   Emphysema of lung (HCC)   Chronic systolic CHF (congestive heart failure) (HCC)   High grade neuroendocrine carcinoma (HCC)   Malnutrition of moderate degree  Metastatic pancreatic cancer with diffuse mets to the liver, right adrenal mass, right chest wall, lumbar spine with abdominal lymph adenopathy  -  Abdominal ultrasound 07/05/2019 dilated CBD concerning for obstruction due to mass-effect 6.1 cm right liver mass and multiple mass in the pancreas.   - Status post radiation 6/7 with plans for chemotherapy by Dr. Annamaria Boots.  - She was admitted from Dr. Fritz Pickerel office with elevated LFTs and leukocytosis.  - GI onboard for ERCP  - 6/9: unable to perform ERCP d/y respiratory distress; CXR obtained, possible PNA, currently on zosyn, check MRSA swab  Constipation   - continue senna  Normocytic anemia   - hemoglobin 10.3 on admit  - No evidence of active bleeding.  - monitor  History of essential hypertension  - soft pressures at admission  - home meds Avapro, Catapres, metoprolol, Aldactone.  - Avapro, Catapres and Aldactone were stopped; metoprolol was continued  - 6/9: BP is up today, resume avapro  History of hyperlipidemia   - not on statins; follow  History of chronic systolic CHF with ejection fraction 20 to 25%.    - Euvolemic.  - 6/9: d/c IVF as she is hypertensive  hyponatremia  - sodium 127 on admission up to 129 with normal saline.  - 6/9: Na+ is 130. Hold fluids. Monitor.  Sinus tachycardia multifactorial   - continue beta-blocker and continue pain control  Tobacco use   - continue nicotine patch  Decreased appetite  - continue Remeron   DVT prophylaxis: lovenox Code Status: FULL Family Communication: Spoke with dtr at bedside   Status is: Inpatient  Remains inpatient appropriate because:Inpatient level of care appropriate due to severity of illness   Dispo: The patient is from: Home  Anticipated d/c is to: Home              Anticipated d/c date is: > 3 days              Patient currently is not medically stable to d/c.  Consultants:   GI  Palliative Care  Antimicrobials:  . Zosyn   ROS:  Denies CP, N, V. Reports ab pain . Remainder 10-pt ROS is negative for all not previously mentioned.  Subjective: "It hurts right  there."  Objective: Vitals:   07/10/19 0800 07/10/19 1148 07/10/19 1302 07/10/19 1400  BP: (!) 149/77 (!) 165/79 139/81 (!) 162/87  Pulse: (!) 116 (!) 126 (!) 126 (!) 122  Resp: 16 (!) 24 18 20   Temp: 98.1 F (36.7 C) 99.2 F (37.3 C)  98.2 F (36.8 C)  TempSrc: Oral Oral Oral Oral  SpO2: 95% 94% 94% 97%  Weight:      Height:       No intake or output data in the 24 hours ending 07/10/19 1628 Filed Weights   07/08/19 1755  Weight: 53.3 kg    Examination:  General: 74 y.o. female resting in bed in NAD Cardiovascular: RRR, +S1, S2, no m/g/r, equal pulses throughout Respiratory: Decreased at bases, normal WOB GI: BS+, ND, no masses noted, no organomegaly noted, LUQ TTP MSK: No e/c/c Neuro: Alert to name, follows commands  Data Reviewed: I have personally reviewed following labs and imaging studies.  CBC: Recent Labs  Lab 07/04/19 1424 07/08/19 1219 07/09/19 0734 07/10/19 0557  WBC 10.4 15.9* 13.1* 10.3  NEUTROABS 7.9* 12.8*  --  8.8*  HGB 10.3* 10.3* 9.7* 9.2*  HCT 31.1* 30.6* 29.0* 27.5*  MCV 80.4 81.4 82.4 81.4  PLT 585* 548* 519* 329*   Basic Metabolic Panel: Recent Labs  Lab 07/04/19 1424 07/08/19 1219 07/09/19 0734 07/10/19 0557  NA 127* 127* 129* 130*  K 4.7 4.6 4.2 4.1  CL 97* 98 99 101  CO2 19* 19* 17* 17*  GLUCOSE 156* 120* 107* 107*  BUN 9 14 19 11   CREATININE 0.82 0.89 0.75 0.47  CALCIUM 9.8 9.6 8.5* 8.4*   GFR: Estimated Creatinine Clearance: 51.9 mL/min (by C-G formula based on SCr of 0.47 mg/dL). Liver Function Tests: Recent Labs  Lab 07/04/19 1424 07/08/19 1219 07/09/19 0734 07/10/19 0557  AST 802* 363* 191* 155*  ALT 871* 706* 457* 311*  ALKPHOS 602* 541* 389* 326*  BILITOT 5.2* 10.9* 11.6* 11.9*  PROT 8.3* 8.1 7.6 7.2  ALBUMIN 2.9* 2.6* 2.9* 2.6*   No results for input(s): LIPASE, AMYLASE in the last 168 hours. No results for input(s): AMMONIA in the last 168 hours. Coagulation Profile: Recent Labs  Lab 07/10/19 0557   INR 1.2   Cardiac Enzymes: No results for input(s): CKTOTAL, CKMB, CKMBINDEX, TROPONINI in the last 168 hours. BNP (last 3 results) No results for input(s): PROBNP in the last 8760 hours. HbA1C: No results for input(s): HGBA1C in the last 72 hours. CBG: Recent Labs  Lab 07/10/19 1159  GLUCAP 96   Lipid Profile: No results for input(s): CHOL, HDL, LDLCALC, TRIG, CHOLHDL, LDLDIRECT in the last 72 hours. Thyroid Function Tests: No results for input(s): TSH, T4TOTAL, FREET4, T3FREE, THYROIDAB in the last 72 hours. Anemia Panel: No results for input(s): VITAMINB12, FOLATE, FERRITIN, TIBC, IRON, RETICCTPCT in the last 72 hours. Sepsis Labs: No results for input(s): PROCALCITON, LATICACIDVEN in the last 168 hours.  Recent Results (from the past 240 hour(s))  SARS Coronavirus  2 by RT PCR (hospital order, performed in [facility] hospital lab) Nasopharyngeal Nasopharyngeal Swab     Status: None   Collection Time: 07/09/19 12:30 PM   Specimen: Nasopharyngeal Swab  Result Value Ref Range Status   SARS Coronavirus 2 NEGATIVE NEGATIVE Final    Comment: (NOTE) SARS-CoV-2 target nucleic acids are NOT DETECTED. The SARS-CoV-2 RNA is generally detectable in upper and lower respiratory specimens during the acute phase of infection. The lowest concentration of SARS-CoV-2 viral copies this assay can detect is 250 copies / mL. A negative result does not preclude SARS-CoV-2 infection and should not be used as the sole basis for treatment or other patient management decisions.  A negative result may occur with improper specimen collection / handling, submission of specimen other than nasopharyngeal swab, presence of viral mutation(s) within the areas targeted by this assay, and inadequate number of viral copies (<250 copies / mL). A negative result must be combined with clinical observations, patient history, and epidemiological information. Fact Sheet for Patients:    StrictlyIdeas.no Fact Sheet for Healthcare Providers: BankingDealers.co.za This test is not yet approved or cleared  by the [place] FDA and has been authorized for detection and/or diagnosis of SARS-CoV-2 by FDA under an Emergency Use Authorization (EUA).  This EUA will remain in effect (meaning this test can be used) for the duration of the COVID-19 declaration under Section 564(b)(1) of the Act, 21 U.S.C. section 360bbb-3(b)(1), unless the authorization is terminated or revoked sooner. Performed at [facility], [place] [address], [place], [place] [address]       Radiology Studies: DG CHEST PORT 1 VIEW  Result Date: 07/10/2019 CLINICAL DATA:  Cough and dyspnea. EXAM: PORTABLE CHEST 1 VIEW COMPARISON:  Chest plain film and chest CT, dated Jun 28, 2019, are available for comparison. FINDINGS: The left lung is hyperinflated. Mild right-sided volume loss is seen. Mild diffuse chronic appearing increased lung markings are seen. Mild, predominant stable patchy opacities are seen overlying the mid right lung and right lung base. There is no evidence of a pleural effusion or pneumothorax. The cardiac silhouette is within normal limits. There is prominence of the right hilum. A 1.4 cm x 0.9 cm focal calcification is also seen within this region. The visualized skeletal structures are unremarkable. IMPRESSION: 1. Mild, predominant stable patchy opacities overlying the mid right lung and right lung base. While this may represent areas of chronic interstitial lung disease, a superimposed acute infiltrate cannot be excluded. 2. Prominent right hilum which corresponds to the right hilar adenopathy seen on the prior chest CT dated Jun 28, 2019. Electronically Signed   By: Virgina Norfolk M.D.   On: 07/10/2019 15:36     Scheduled Meds: . feeding supplement  1 Container Oral TID BM  . feeding supplement (ENSURE ENLIVE)  237 mL Oral BID  BM  . guaiFENesin  600 mg Oral BID  . metoprolol succinate  25 mg Oral Daily  . mirtazapine  7.5 mg Oral QHS  . nicotine  21 mg Transdermal Daily  . oxyCODONE  15 mg Oral Q12H  . senna-docusate  1 tablet Oral QHS  . senna-docusate  2 tablet Oral Daily   Continuous Infusions: . sodium chloride 75 mL/hr at 07/09/19 2113  . piperacillin-tazobactam (ZOSYN)  IV 3.375 g (07/10/19 0804)     LOS: 2 days    Time spent: 35 minutes spent in the coordination of care today.    Jonnie Finner, DO [facility]  If  7PM-7AM, please contact night-coverage [email or web address] 07/10/2019, 4:28 PM

## 2019-07-10 NOTE — Progress Notes (Addendum)
[facility] Progress Note  CC:  Obstructive jaundice secondary to neuroendocrine tumor of the pancreas   Subjective:  Unfortunately her pulmonary status has worsened since yesterday.  Anesthesia cancelled her ERCP due to respiratory concerns.  Complaining more so of cough.  Discussed with patient and family at bedside.  Objective:  Vital signs in last 24 hours: Temp:  [98.1 F (36.7 C)-100.1 F (37.8 C)] 100.1 F (37.8 C) (06/09 1302) Pulse Rate:  [101-126] 126 (06/09 1302) Resp:  [14-24] 18 (06/09 1302) BP: (93-165)/(55-84) 139/81 (06/09 1302) SpO2:  [94 %-97 %] 94 % (06/09 1302) Last BM Date: 07/07/19 General:  Alert, Well-developed, in NAD, but appears fatigued.  Scleral icterus present. Heart:  Tachycardic. Pulm:  Wheezes and rhonchi noted more so on the right posteriorly. Abdomen:  Soft, non-distended.  BS present.  Non-tender. Extremities:  Without edema. Neurologic:  Alert and oriented x 4;  grossly normal neurologically. Psych:  Alert and cooperative. Normal mood and affect.  Intake/Output from previous day: 06/08 0701 - 06/09 0700 In: 917 [P.O.:317; I.V.:550; IV Piggyback:50] Out: -   Lab Results: Recent Labs    07/08/19 1219 07/09/19 0734 07/10/19 0557  WBC 15.9* 13.1* 10.3  HGB 10.3* 9.7* 9.2*  HCT 30.6* 29.0* 27.5*  PLT 548* 519* 521*   BMET Recent Labs    07/08/19 1219 07/09/19 0734 07/10/19 0557  NA 127* 129* 130*  K 4.6 4.2 4.1  CL 98 99 101  CO2 19* 17* 17*  GLUCOSE 120* 107* 107*  BUN _0 CREATININE 0.89 0.75 0.47  CALCIUM 9.6 8.5* 8.4*   LFT Recent Labs    07/10/19 0557  PROT 7.2  ALBUMIN 2.6*  AST 155*  ALT 311*  ALKPHOS 326*  BILITOT 11.9*  BILIDIR 7.7*  IBILI 4.2*   PT/INR Recent Labs    07/10/19 0557  LABPROT 14.5  INR 1.2    Assessment / Plan: 75.  74 year old female recently diagnosed with neuroendocrine pancreatic cancer with diffuse metastasis presents with elevated T. Bili, alk phos and  LFTs with leukocytosis concerning for biliary obstruction at risk for ascending cholangitis. CTAP 06/28/2019 showed a 4.3 cm pancreatic mass, a 6.1 x 4.3cm liver mass and intrahepatic biliary ductal dilatation secondary to mass effect on the common bile duct from periportal adenopathy. An abdominal sonogram 6/4 showed a 6.1 cm right hepatic liver mass, multiple masses in the pancreas and a dilated CBD at 8cm. -ERCP has been cancelled due to anesthesia concerns with dyspnea at rest and possible inability to extubate.  Rescheduled procedure TBD.  Will likely need left and right hepatic biliary system drainage either via ERCP with stents and/or PTC when respiratory status improved. -Zosyn 3.342m IV Q 8 hrs   2.  Normocytic anemia. Hg 10.3 -> 9.7->9.2 ( Hg 9.9. on 07/02/2019).  HCT 306. MCV 81.4.    3. 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  Hyponatremia. Na+ 127 -> 129->130.  5. CHF, LV EF 20 -25%  6. Constipation -Miralax Q HS PRN   **[facility], made aware of cancelled procedure and anesthesia findings/concerns.   LOS: 2 days   JLaban Emperor Zehr  07/10/2019, 1:12 PM     Attending Physician Note   I have taken an interval history, reviewed the chart and examined the patient. I agree with the Advanced Practitioner's note, impression and recommendations.   Dyspnea at rest, cough and new right chest findings on exam. Anesthesia team concerned about increased  risk of general anesthesia and possibly unable to extubate post procedure so ERCP was canceled. We have contacted the hospitalist to further evaluate. Reschedule ERCP TBD.  Will likely need left and right hepatic biliary system drainage either via ERCP with stents and/or PTC when respiratory status improved.  Lucio Edward, MD [facility]

## 2019-07-10 NOTE — Progress Notes (Signed)
Palliative:  Called and spoke with patient's daughter, Denman George. Discussed palliative care consult to discuss goals of care. Yolanda fears that a meeting with patient will upset her too much and compromise her respiratory status. She requests that we meet in private but does not want to leave the patient alone today. I provided her with my contact information and availability and she plans to call me back to schedule goals of care meeting with her and her siblings.   Juel Burrow, DNP, AGNP-C Palliative Medicine Team Team Phone # [phone]  Pager # [phone]  NO CHARGE

## 2019-07-11 ENCOUNTER — Inpatient Hospital Stay (HOSPITAL_COMMUNITY): Payer: Medicare (Managed Care) | Admitting: Anesthesiology

## 2019-07-11 ENCOUNTER — Ambulatory Visit: Payer: Medicare (Managed Care)

## 2019-07-11 ENCOUNTER — Encounter (HOSPITAL_COMMUNITY)
Admission: AD | Disposition: A | Discharge status: Home / Self Care | Payer: Self-pay | Source: Ambulatory Visit | Attending: Internal Medicine

## 2019-07-11 ENCOUNTER — Inpatient Hospital Stay (HOSPITAL_COMMUNITY): Payer: Medicare (Managed Care)

## 2019-07-11 ENCOUNTER — Other Ambulatory Visit (HOSPITAL_COMMUNITY): Payer: PRIVATE HEALTH INSURANCE

## 2019-07-11 HISTORY — PX: ERCP: SHX5425

## 2019-07-11 HISTORY — PX: PANCREATIC STENT PLACEMENT: SHX5539

## 2019-07-11 LAB — CBC WITH DIFFERENTIAL/PLATELET
Abs Immature Granulocytes: 0.09 K/uL — ABNORMAL HIGH (ref 0.00–0.07)
Basophils Absolute: 0 K/uL (ref 0.0–0.1)
Basophils Relative: 0 %
Eosinophils Absolute: 0 K/uL (ref 0.0–0.5)
Eosinophils Relative: 0 %
HCT: 28.2 % — ABNORMAL LOW (ref 36.0–46.0)
Hemoglobin: 9.4 g/dL — ABNORMAL LOW (ref 12.0–15.0)
Immature Granulocytes: 1 %
Lymphocytes Relative: 4 %
Lymphs Abs: 0.5 K/uL — ABNORMAL LOW (ref 0.7–4.0)
MCH: 27 pg (ref 26.0–34.0)
MCHC: 33.3 g/dL (ref 30.0–36.0)
MCV: 81 fL (ref 80.0–100.0)
Monocytes Absolute: 0.7 K/uL (ref 0.1–1.0)
Monocytes Relative: 7 %
Neutro Abs: 9.3 K/uL — ABNORMAL HIGH (ref 1.7–7.7)
Neutrophils Relative %: 88 %
Platelets: 591 K/uL — ABNORMAL HIGH (ref 150–400)
RBC: 3.48 MIL/uL — ABNORMAL LOW (ref 3.87–5.11)
RDW: 17.5 % — ABNORMAL HIGH (ref 11.5–15.5)
WBC: 10.5 K/uL (ref 4.0–10.5)
nRBC: 0 % (ref 0.0–0.2)

## 2019-07-11 LAB — COMPREHENSIVE METABOLIC PANEL WITH GFR
ALT: 252 U/L — ABNORMAL HIGH (ref 0–44)
AST: 130 U/L — ABNORMAL HIGH (ref 15–41)
Albumin: 2.6 g/dL — ABNORMAL LOW (ref 3.5–5.0)
Alkaline Phosphatase: 326 U/L — ABNORMAL HIGH (ref 38–126)
Anion gap: 13 (ref 5–15)
BUN: 11 mg/dL (ref 8–23)
CO2: 18 mmol/L — ABNORMAL LOW (ref 22–32)
Calcium: 9 mg/dL (ref 8.9–10.3)
Chloride: 100 mmol/L (ref 98–111)
Creatinine, Ser: 0.35 mg/dL — ABNORMAL LOW (ref 0.44–1.00)
GFR calc Af Amer: >60 mL/min
GFR calc non Af Amer: >60 mL/min
Glucose, Bld: 101 mg/dL — ABNORMAL HIGH (ref 70–99)
Potassium: 4.1 mmol/L (ref 3.5–5.1)
Sodium: 131 mmol/L — ABNORMAL LOW (ref 135–145)
Total Bilirubin: 14.7 mg/dL — ABNORMAL HIGH (ref 0.3–1.2)
Total Protein: 7.7 g/dL (ref 6.5–8.1)

## 2019-07-11 LAB — MAGNESIUM: Magnesium: 2.3 mg/dL (ref 1.7–2.4)

## 2019-07-11 SURGERY — ERCP, WITH INTERVENTION IF INDICATED
Anesthesia: General

## 2019-07-11 MED ORDER — SUGAMMADEX SODIUM 200 MG/2ML IV SOLN
INTRAVENOUS | Status: DC | PRN
  Administered 2019-07-11: 200 mg via INTRAVENOUS

## 2019-07-11 MED ORDER — FENTANYL CITRATE (PF) 100 MCG/2ML IJ SOLN
INTRAMUSCULAR | Status: AC
  Filled 2019-07-11: qty 2

## 2019-07-11 MED ORDER — ENOXAPARIN SODIUM 40 MG/0.4ML ~~LOC~~ SOLN
40.0000 mg | SUBCUTANEOUS | Status: DC
  Administered 2019-07-11 – 2019-07-12 (×2): 40 mg via SUBCUTANEOUS
  Filled 2019-07-11 (×2): qty 0.4

## 2019-07-11 MED ORDER — GLUCAGON HCL RDNA (DIAGNOSTIC) 1 MG IJ SOLR
INTRAMUSCULAR | Status: DC | PRN
Start: 2019-07-11 — End: 2019-07-11
  Administered 2019-07-11 (×2): .25 mg via INTRAVENOUS

## 2019-07-11 MED ORDER — METOPROLOL SUCCINATE ER 50 MG PO TB24
50.0000 mg | ORAL_TABLET | Freq: Once | ORAL | Status: AC
  Administered 2019-07-11: 50 mg via ORAL
  Filled 2019-07-11: qty 1

## 2019-07-11 MED ORDER — ESMOLOL HCL 100 MG/10ML IV SOLN
INTRAVENOUS | Status: DC | PRN
Start: 2019-07-11 — End: 2019-07-11
  Administered 2019-07-11 (×2): 30 mg via INTRAVENOUS

## 2019-07-11 MED ORDER — DEXAMETHASONE SODIUM PHOSPHATE 10 MG/ML IJ SOLN
INTRAMUSCULAR | Status: DC | PRN
  Administered 2019-07-11: 10 mg via INTRAVENOUS

## 2019-07-11 MED ORDER — METOPROLOL TARTRATE 5 MG/5ML IV SOLN
INTRAVENOUS | Status: DC | PRN
  Administered 2019-07-11: 2.5 mg via INTRAVENOUS

## 2019-07-11 MED ORDER — INDOMETHACIN 50 MG RE SUPP
RECTAL | Status: DC | PRN
  Administered 2019-07-11: 25 mg via RECTAL

## 2019-07-11 MED ORDER — FENTANYL CITRATE (PF) 100 MCG/2ML IJ SOLN
INTRAMUSCULAR | Status: DC | PRN
  Administered 2019-07-11: 50 ug via INTRAVENOUS

## 2019-07-11 MED ORDER — SODIUM CHLORIDE 0.9 % IV SOLN: INTRAVENOUS | Status: DC

## 2019-07-11 MED ORDER — METOPROLOL SUCCINATE ER 50 MG PO TB24
75.0000 mg | ORAL_TABLET | Freq: Every day | ORAL | Status: DC
  Administered 2019-07-12 – 2019-07-13 (×2): 75 mg via ORAL
  Filled 2019-07-11 (×2): qty 1

## 2019-07-11 MED ORDER — GLUCAGON HCL RDNA (DIAGNOSTIC) 1 MG IJ SOLR
INTRAMUSCULAR | Status: AC
  Filled 2019-07-11: qty 1

## 2019-07-11 MED ORDER — CIPROFLOXACIN IN D5W 400 MG/200ML IV SOLN
INTRAVENOUS | Status: AC
  Filled 2019-07-11: qty 200

## 2019-07-11 MED ORDER — ONDANSETRON HCL 4 MG/2ML IJ SOLN
INTRAMUSCULAR | Status: DC | PRN
  Administered 2019-07-11: 4 mg via INTRAVENOUS

## 2019-07-11 MED ORDER — INDOMETHACIN 50 MG RE SUPP
100.0000 mg | Freq: Once | RECTAL | Status: AC
  Administered 2019-07-11: 100 mg via RECTAL
  Filled 2019-07-11: qty 2

## 2019-07-11 MED ORDER — LACTATED RINGERS IV SOLN: INTRAVENOUS | Status: DC | PRN

## 2019-07-11 MED ORDER — ROCURONIUM BROMIDE 10 MG/ML (PF) SYRINGE
PREFILLED_SYRINGE | INTRAVENOUS | Status: DC | PRN
  Administered 2019-07-11: 50 mg via INTRAVENOUS

## 2019-07-11 MED ORDER — SODIUM CHLORIDE 0.9 % IV SOLN
INTRAVENOUS | Status: DC | PRN
  Administered 2019-07-11: 15 mL

## 2019-07-11 MED ORDER — LIDOCAINE 2% (20 MG/ML) 5 ML SYRINGE
INTRAMUSCULAR | Status: DC | PRN
  Administered 2019-07-11: 40 mg via INTRAVENOUS

## 2019-07-11 MED ORDER — ETOMIDATE 2 MG/ML IV SOLN
INTRAVENOUS | Status: DC | PRN
  Administered 2019-07-11: 14 mg via INTRAVENOUS

## 2019-07-11 NOTE — H&P (View-Only) (Signed)
[facility] Progress Note  CC:  Obstructive jaundice secondary to neuroendocrine tumor of the pancreas  Subjective:  Says that she feels ok.  Still with some cough but no significant dyspnea at rest.  No abdominal pain, nausea, vomiting.  Objective:  Vital signs in last 24 hours: Temp:  [97.5 F (36.4 C)-99.2 F (37.3 C)] 97.7 F (36.5 C) (06/10 0612) Pulse Rate:  [100-126] 124 (06/10 0612) Resp:  [18-24] 18 (06/10 0612) BP: (137-165)/(70-93) 137/70 (06/10 0612) SpO2:  [94 %-98 %] 98 % (06/10 0612) Last BM Date: 07/07/19 General:  Alert, Well-developed, in NAD; scleral icterus present Heart:  Tachy. Pulm:  No wheezing noted.  No increased WOB. Abdomen:  Soft, non-distended.  BS present.  Non-tender. Extremities:  Without edema. Neurologic:  Alert and  oriented x4;  grossly normal neurologically. Psych:  Alert and cooperative. Normal mood and affect.  Lab Results: Recent Labs    07/09/19 0734 07/10/19 0557 07/11/19 0608  WBC 13.1* 10.3 10.5  HGB 9.7* 9.2* 9.4*  HCT 29.0* 27.5* 28.2*  PLT 519* 521* 591*   BMET Recent Labs    07/09/19 0734 07/10/19 0557 07/11/19 0608  NA 129* 130* 131*  K 4.2 4.1 4.1  CL 99 101 100  CO2 17* 17* 18*  GLUCOSE 107* 107* 101*  BUN _0 CREATININE 0.75 0.47 0.35*  CALCIUM 8.5* 8.4* 9.0   LFT Recent Labs    07/10/19 0557 07/10/19 0557 07/11/19 0608  PROT 7.2   < > 7.7  ALBUMIN 2.6*   < > 2.6*  AST 155*   < > 130*  ALT 311*   < > 252*  ALKPHOS 326*   < > 326*  BILITOT 11.9*   < > 14.7*  BILIDIR 7.7*  --   --   IBILI 4.2*  --   --    < > = values in this interval not displayed.   PT/INR Recent Labs    07/10/19 0557  LABPROT 14.5  INR 1.2   DG CHEST PORT 1 VIEW  Result Date: 07/10/2019 CLINICAL DATA:  Cough and dyspnea. EXAM: PORTABLE CHEST 1 VIEW COMPARISON:  Chest plain film and chest CT, dated Jun 28, 2019, are available for comparison. FINDINGS: The left lung is hyperinflated. Mild  right-sided volume loss is seen. Mild diffuse chronic appearing increased lung markings are seen. Mild, predominant stable patchy opacities are seen overlying the mid right lung and right lung base. There is no evidence of a pleural effusion or pneumothorax. The cardiac silhouette is within normal limits. There is prominence of the right hilum. A 1.4 cm x 0.9 cm focal calcification is also seen within this region. The visualized skeletal structures are unremarkable. IMPRESSION: 1. Mild, predominant stable patchy opacities overlying the mid right lung and right lung base. While this may represent areas of chronic interstitial lung disease, a superimposed acute infiltrate cannot be excluded. 2. Prominent right hilum which corresponds to the right hilar adenopathy seen on the prior chest CT dated Jun 28, 2019. Electronically Signed   By: Virgina Norfolk M.D.   On: 07/10/2019 15:36   Assessment / Plan: 34. 74 year old female recently diagnosed with neuroendocrine pancreatic cancer with diffuse metastasis presents with elevated T. Bili, alk phos and LFTs with leukocytosis concerning for biliary obstruction at risk for ascending cholangitis. CTAP 06/28/2019 showed a 4.3 cm pancreatic mass, a 6.1 x 4.3cmlivermass and intrahepatic biliary ductaldilatation secondary to mass effect on the common bile duct  from periportal adenopathy. An abdominal sonogram 6/4 showed a 6.1 cm right hepatic liver mass, multiple masses in the pancreas and a dilated CBD at 8cm.  Total bili increasing. -Will see if anesthesia can evaluate the patient on the floor today and possibly clear her for ERCP later this afternoon.  Will likely need left and right hepatic biliary system drainage either via ERCP with stents and/or PTC. -Zosyn 3.328m IV Q 8 hrs   2. Normocytic anemia. Hgb stable.  3. 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 Hyponatremia. Na+ 127 -> 129->130->131.  5. CHF, LV EF 20 -25%  6.  Constipation -Miralax Q HS PRN   LOS: 3 days   JLaban Emperor Zehr  07/11/2019, 9:14 AM     Attending Physician Note   I have taken an interval history, reviewed the chart and examined the patient. I agree with the Advanced Practitioner's note, impression and recommendations.   * Respiratory status improved today. ERCP later today is possible if cleared by anesthesia  * IR consult for consideration of PTC if unable to proceed with ERCP or ERCP performed and unable to bridge all obstructed areas   MLucio Edward MD [facility]

## 2019-07-11 NOTE — Transfer of Care (Signed)
Immediate Anesthesia Transfer of Care Note  Patient: Alisha Goodman  Procedure(s) Performed: Procedure(s): ENDOSCOPIC RETROGRADE CHOLANGIOPANCREATOGRAPHY (ERCP) (N/A) PANCREATIC STENT PLACEMENT  Patient Location: PACU  Anesthesia Type:General  Level of Consciousness: Alert, Awake, Oriented  Airway & Oxygen Therapy: Patient Spontanous Breathing  Post-op Assessment: Report given to RN  Post vital signs: Reviewed and stable  Last Vitals:  Vitals:   07/11/19 1445 07/11/19 1631  BP: (!) 175/72 (!) 153/72  Pulse: (!) 131 (!) 117  Resp: (!) 28 (!) 22  Temp: 37.3 C 36.8 C  SpO2: 41% 36%    Complications: No apparent anesthesia complications

## 2019-07-11 NOTE — Anesthesia Preprocedure Evaluation (Addendum)
Anesthesia Evaluation  Patient identified by MRN, date of birth, ID band Patient awake    Reviewed: Allergy & Precautions, H&P , NPO status , Patient's Chart, lab work & pertinent test results, reviewed documented beta blocker date and time   History of Anesthesia Complications Negative for: history of anesthetic complications  Airway Mallampati: II  TM Distance: >3 FB Neck ROM: full    Dental no notable dental hx. (+) Edentulous Upper, Edentulous Lower   Pulmonary shortness of breath and at rest, COPD, former smoker,       rales    Cardiovascular Exercise Tolerance: Good hypertension, Pt. on medications and Pt. on home beta blockers (-) angina+CHF  + dysrhythmias (in Aflutter presently) Atrial Fibrillation  Rhythm:regular Rate:Tachycardia  ECHO 5/21 No apical thrombus by Definity contrast. Left ventricular ejection  fraction, by estimation, is 20 to 25%. The left ventricle has severely  decreased function. The left ventricle demonstrates global hypokinesis.  The left ventricular internal cavity  size was mildly dilated. indeterminate diastolic function, due to what  appears to be atrial flutter.    Neuro/Psych  Neuromuscular disease negative psych ROS   GI/Hepatic Elevated LFTs with pancreatic cancer/obstruction Pancreatic cancer   Endo/Other  negative endocrine ROS  Renal/GU negative Renal ROS     Musculoskeletal   Abdominal   Peds  Hematology  (+) Blood dyscrasia (Hb 9.4), anemia ,   Anesthesia Other Findings   Reproductive/Obstetrics negative OB ROS                         Anesthesia Physical  Anesthesia Plan  ASA: IV  Anesthesia Plan: General   Post-op Pain Management:    Induction: Intravenous  PONV Risk Score and Plan: 3 and Ondansetron and Treatment may vary due to age or medical condition  Airway Management Planned: Oral ETT  Additional Equipment: None  Intra-op  Plan:   Post-operative Plan: Extubation in OR  Informed Consent: I have reviewed the patients History and Physical, chart, labs and discussed the procedure including the risks, benefits and alternatives for the proposed anesthesia with the patient or authorized representative who has indicated his/her understanding and acceptance.       Plan Discussed with: CRNA and Surgeon  Anesthesia Plan Comments: (  Acute systolic heart failure - new diagnosis.  She has been followed by primary care provider.  She was surprised to learn of this diagnosis of probable metastatic cancer.  Additionally, there is new reduction in LVEF to 20 to 25%.  This appears to be global hypokinesis and I suspect is nonischemic.  On admission she was not tachycardic rather in a normal sinus rhythm therefore it is unlikely to be tachycardia mediated.        -Agree with metoprolol for rate control and treatment of cardiomyopathy             -Would stop Norvasc and add irbesartan, possibly switch to Entresto later             - Consider ischemia evaluation with stress testing, however, suspect this is              NICM and she is not likely a candidate for revascularization 2. Possible atrial flutter vs EAT / NSVT- rhythm appeared initially to be flutter, however, the morphology of the tachycardia very similar to sinus rhythm - could be sinus tach, although not clear why HR just increased yesterday - also noted to have NSVT. Suspect this is an  ectopic atrial rhythm. -On metoprolol 25 mg BID - may increase further for rate control  -Consider escalating anticoagulation from prophylaxis to treatment if flutter is confirmed.  3.   Metastatic cancer - unknown primary, work-up is underway per oncology -       suspect lung or pancreas. 4.   Essential hypertension - stop amlodipine, change to irbesartan - metoprolol,       continue clonidine. )      Anesthesia Quick Evaluation

## 2019-07-11 NOTE — Progress Notes (Signed)
HEMATOLOGY-ONCOLOGY PROGRESS NOTE  SUBJECTIVE: Remains afebrile.  Denies abdominal pain, nausea, vomiting.  Bowels moved on Tuesday.  ERCP was canceled yesterday secondary to worsening respiratory status.  Today, she reports that her breathing is stable.  Still has an intermittent nonproductive cough.  GI hoping to proceed with ERCP later today.  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 Site of primary malignancy is not definitively determined, however but favored to represent right lung primary. There is also pancreatic lesion which may be an alternative primary. Tissue sampling is recommended. 2. Bulky mediastinal and right hilar adenopathy, nodal conglomerate causes narrowing of the right upper lobe bronchus, right bronchus intermedius, upper and lower lobe bronchi. Multiple right-sided pulmonary nodules, including pleural based nodules posteriorly. 3. Large liver lesion suspicious for metastasis. There is biliary dilatation which is likely secondary to mass effect on the common bile duct from periportal adenopathy. 4. Multifocal abdominal adenopathy throughout the retroperitoneum, periportal region, central mesentery. 5. Pancreatic mass measuring 4.3 cm which may be metastasis or primary malignancy. 6. Right adrenal mass and left adrenal thickening. 7. Subcutaneous metastasis to the right chest wall. Left paraspinal intramuscular metastasis at the level of L3. 8. Non oncologic findings of right renal atrophy with large nonobstructing stones in the lower right kidney.   Aortic Atherosclerosis (ICD10-I70.0) and Emphysema (ICD10-J43.9).   06/28/2019 Initial Biopsy   FINAL MICROSCOPIC DIAGNOSIS:   A. Lymph node, biopsy:  -  Metastatic neuroendocrine carcinoma, high-grade  -  See comment   COMMENT:   By immunohistochemistry, the  neoplastic cells are positive for  cytokeratin 7, chromogranin, synaptic ficin and TTF-1 but negative for  cytokeratin 20.  The proliferative rate by Ki-67 immunohistochemistry is  approximately 80%.  Overall, the findings are consistent with a  high-grade neuroendocrine carcinoma.  The differential includes large  cell neuroendocrine carcinoma.  Small cell carcinoma is less likely  given the overall cytologic appearance.   07/05/2019 Imaging   US Abdomen  IMPRESSION: 6.1 cm solitary mass seen in right hepatic lobe consistent with metastatic disease.   Multiple masses are seen involving the pancreas, with the largest measuring 3.9 cm in the pancreatic tail.   Common bile duct is mildly dilated at 8 mm concerning for distal common bile duct obstruction. Potentially this may be due to previously described masses.   Gallbladder is not visualized.   Calyceal dilatation is again noted involving the right kidney, with probable nonobstructive calculus seen in lower pole collecting system.   07/08/2019 Initial Diagnosis   High grade neuroendocrine carcinoma ([facility])      REVIEW OF SYSTEMS:   Constitutional: Denies fevers and chills Respiratory: Denies cough, dyspnea or wheezes Cardiovascular: Denies palpitation, chest discomfort Gastrointestinal: Denies abdominal pain, nausea, vomiting.  Constipation resolved Extremities: No lower extremity edema All other systems were reviewed with the patient and are negative.  I have reviewed the past medical history, past surgical history, social history and family history with the patient and they are unchanged from previous note.   PHYSICAL EXAMINATION: ECOG PERFORMANCE STATUS: 3 - Symptomatic, >50% confined to bed  Vitals:   07/11/19 0612 07/11/19 1001  BP: 137/70 (!) 155/68  Pulse: (!) 124 (!) 122  Resp: 18 20  Temp: 97.7 F (36.5 C) 98.8 F (37.1 C)  SpO2: 98% 98%   Filed Weights   07/08/19 1755  Weight: 53.3 kg  Intake/Output from previous day: No intake/output data recorded.  GENERAL:alert, no distress and comfortable HEENT: Scleral icterus noted SKIN: skin color, texture, turgor are normal, no rashes or significant lesions LUNGS: clear to auscultation and percussion with normal breathing effort HEART: regular rate & rhythm and no murmurs and no lower extremity edema ABDOMEN:abdomen soft, non-tender and normal bowel sounds NEURO: alert & oriented x 3 with fluent speech, no focal motor/sensory deficits  LABORATORY DATA:  I have reviewed the data as listed CMP Latest Ref Rng & Units 07/11/2019 07/10/2019 07/09/2019  Glucose 70 - 99 mg/dL 101(H) 107(H) 107(H)  BUN 8 - 23 mg/dL _0 Creatinine 0.44 - 1.00 mg/dL 0.35(L) 0.47 0.75  Sodium 135 - 145 mmol/L 131(L) 130(L) 129(L)  Potassium 3.5 - 5.1 mmol/L 4.1 4.1 4.2  Chloride 98 - 111 mmol/L 100 101 99  CO2 22 - 32 mmol/L 18(L) 17(L) 17(L)  Calcium 8.9 - 10.3 mg/dL 9.0 8.4(L) 8.5(L)  Total Protein 6.5 - 8.1 g/dL 7.7 7.2 7.6  Total Bilirubin 0.3 - 1.2 mg/dL 14.7(H) 11.9(H) 11.6(H)  Alkaline Phos 38 - 126 U/L 326(H) 326(H) 389(H)  AST 15 - 41 U/L 130(H) 155(H) 191(H)  ALT 0 - 44 U/L 252(H) 311(H) 457(H)    Lab Results  Component Value Date   WBC 10.5 07/11/2019   HGB 9.4 (L) 07/11/2019   HCT 28.2 (L) 07/11/2019   MCV 81.0 07/11/2019   PLT 591 (H) 07/11/2019   NEUTROABS 9.3 (H) 07/11/2019    DG Chest 2 View  Result Date: 06/28/2019 CLINICAL DATA:  Shortness of breath, weakness EXAM: CHEST - 2 VIEW COMPARISON:  Radiograph 06/22/2004 FINDINGS: Irregular dense opacity is seen along the right mediastinal border and projecting over the hilum with a possible hilum overlay sign suggesting either anterior or posterior mediastinal origins. However, suspect that this process may be involving the right central airways given more peripheral wedge like opacity throughout the right lung which could reflect a postobstructive infectious or inflammatory  process. Central pulmonary arteries are enlarged. Cardiac size within normal limits. No pneumothorax or visible effusion. No acute osseous or soft tissue abnormality. Telemetry leads overlie the chest. IMPRESSION: Irregular dense opacity along the right mediastinal border and projecting over the hilum with possible involvement of the airways given more peripheral opacity which could reflect a postobstructive process. Further evaluation with cross-sectional imaging is warranted. Central pulmonary arterial enlargement noted as well. May reflect pulmonary artery hypertension. Electronically Signed   By: Lovena Le M.D.   On: 06/28/2019 01:37   CT Chest W Contrast  Result Date: 06/28/2019 CLINICAL DATA:  Mediastinal mass on x-ray. Elevated LFTs. Left back pain. EXAM: CT CHEST, ABDOMEN, AND PELVIS WITH CONTRAST TECHNIQUE: Multidetector CT imaging of the chest, abdomen and pelvis was performed following the standard protocol during bolus administration of intravenous contrast. CONTRAST:  124m OMNIPAQUE IOHEXOL 300 MG/ML  SOLN COMPARISON:  Chest radiograph earlier this day. Remote abdominal CT 1126 FINDINGS: CT CHEST FINDINGS Cardiovascular: Aortic atherosclerosis. Aortic tortuosity without aneurysm. Heart is normal in size. Coronary artery calcifications. No pericardial effusion. Extensive mediastinal adenopathy causes mass effect and narrowing of the right pulmonary arteries. No obvious intraluminal filling defect. Mediastinum/Nodes: Extensive mediastinal and right hilar adenopathy, with confluent nodal conglomerate extending from the subcarinal station to the level of the mid trachea. Lymph nodes causes mass effect and narrowing of the right upper lobe bronchus bronchus intermedius and both lower lobe bronchi. Representative measurement at the level of the carina of 6.8 x  5.3 cm, series 1, image 23. Subcarinal nodal conglomerate measures 3.4 x 5.2 cm, series 1, image 30. Prevascular nodal mass measuring 5.4 x 2.3  cm, series 1, image 25. Left supraclavicular adenopathy measuring 2 x 2 cm, series 1, image 5. Additional adenopathy at multiple stations. Many of these lymph nodes are partially calcified and low-density/necrotic. There is no left hilar adenopathy. There is no axillary adenopathy. Slight mass effect on the esophagus from nodal conglomerate, which is difficult to delineate in the mid distal portion. No visualized thyroid nodule. Lungs/Pleura: There are multiple right pulmonary nodules. It is difficult to delineate nodules from adenopathy in the central right lung. Adenopathy is irregular borders. Multiple branching nodules in the right upper lobe which may represent lymphangitic spread, for example series 3, image 54. There is a 10 mm medial right lower lobe pulmonary nodule, series 3, image 73. 9 mm right lower lobe nodule series 3, image 87. There is pleural based nodularity in the posterior right upper lobe that is contiguous with adenopathy. Adenopathy causes mass effect and narrowing of the right upper lobe bronchus, bronchus intermedius and right middle and lower lobe bronchi. No significant pleural effusion. No discrete left lung pulmonary nodules.  Mild emphysema. Musculoskeletal: No blastic or destructive lytic lesions. Right chest wall metastasis, series 1, image 35. CT ABDOMEN PELVIS FINDINGS Hepatobiliary: Large irregular hypodense liver lesion in the inferior liver tip measures 6.1 x 4.3 cm. There is right greater than left intrahepatic biliary ductal dilatation, likely due to mass effect from periportal adenopathy on the common bile duct. Gallbladder is not well visualized. Pancreas: Heterogeneous mass in the pancreatic body measuring 4.3 x 2.5 cm. There is no distal atrophy. Mild proximal ductal dilatation of 5 mm. Spleen: Calcified granuloma.  Splenule inferiorly. Adrenals/Urinary Tract: Nodular thickening of the left adrenal gland up to 11 mm. Heterogeneous right adrenal mass measures 3.3 x 2.6 cm,  increased in size from prior exam. There is prominent right renal parenchymal thinning with calyceal dilatation of the upper and lower poles. Large intra caliceal stones in the lower right kidney. There is no significant perinephric edema. Urinary bladder is distended but unremarkable. Stomach/Bowel: No obvious gastric mass. No small bowel obstruction or inflammatory change. High-density material throughout the colon may be contrast or bismuth containing products. Moderate stool proximally. No obvious colonic mass. Appendix is normal. Vascular/Lymphatic: Multifocal abdominal adenopathy. Periportal adenopathy with 3 x 2.3 cm lymph node, series 1, image 66. There are additional enlarged periportal nodes. Multiple enlarged upper abdominal nodes in the retroperitoneum and peripancreatic space. Representative right retroperitoneal node measures 3.0 x 1.8 cm, series 1, image 65. There is an enlarged central mesenteric node at 9 mm, series 1, image 75. Right retroperitoneal adenopathy causes mild mass effect on the IVC. There is a retrocrural node adjacent to the distal aorta. No pelvic adenopathy. Aortic and branch atherosclerosis. The portal vein is attenuated with mass effect from adjacent adenopathy. Reproductive: The uterus is not seen.  No obvious adnexal mass. Other: No ascites or omental thickening.  No free air. Musculoskeletal: Left paraspinal met measures 3.1 x 2.2 cm, series 168 at the level of L3-L4. No evidence of blastic or destructive lytic lesion. Scoliotic curvature of the spine. IMPRESSION: 1. 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 Site of primary malignancy is not definitively determined, however but favored to represent right lung primary. There is also pancreatic lesion which may be an alternative primary. Tissue sampling is recommended. 2. Bulky mediastinal and right hilar  adenopathy, nodal conglomerate causes narrowing of the right upper lobe bronchus, right bronchus  intermedius, upper and lower lobe bronchi. Multiple right-sided pulmonary nodules, including pleural based nodules posteriorly. 3. Large liver lesion suspicious for metastasis. There is biliary dilatation which is likely secondary to mass effect on the common bile duct from periportal adenopathy. 4. Multifocal abdominal adenopathy throughout the retroperitoneum, periportal region, central mesentery. 5. Pancreatic mass measuring 4.3 cm which may be metastasis or primary malignancy. 6. Right adrenal mass and left adrenal thickening. 7. Subcutaneous metastasis to the right chest wall. Left paraspinal intramuscular metastasis at the level of L3. 8. Non oncologic findings of right renal atrophy with large nonobstructing stones in the lower right kidney. Aortic Atherosclerosis (ICD10-I70.0) and Emphysema (ICD10-J43.9). Electronically Signed   By: Keith Rake M.D.   On: 06/28/2019 03:46   US Abdomen Complete  Result Date: 07/05/2019 CLINICAL DATA:  Hyperbilirubinemia. EXAM: ABDOMEN ULTRASOUND COMPLETE COMPARISON:  Jun 28, 2019. FINDINGS: Gallbladder: Not visualized. Common bile duct: Diameter: 8 mm which is mildly dilated concerning for possible distal common bile duct obstruction. Liver: Large solitary mass measuring 6.1 cm is noted in right hepatic lobe consistent with metastatic disease. Within normal limits in parenchymal echogenicity. Portal vein is patent on color Doppler imaging with normal direction of blood flow towards the liver. IVC: No abnormality visualized. Pancreas: Multiple masses are noted within the pancreas as described on prior CT scan, the largest measuring 3.9 x 3.6 x 2.6 cm in the pancreatic tail. Spleen: Calcification is noted most consistent with calcified splenic granuloma. No other abnormality is noted. Right Kidney: Length: 11.1 cm. Increased echogenicity of renal parenchyma is noted. Calyceal dilatation is noted throughout the right kidney as described on prior CT scan. No significant  pelvic dilatation is noted. 1.5 cm nonobstructive calculus is noted in lower pole. Left Kidney: Length: 11.6 cm. Increased echogenicity is noted consistent with medical renal disease. No mass or hydronephrosis visualized. Abdominal aorta: No aneurysm visualized. Other findings: None. IMPRESSION: 6.1 cm solitary mass seen in right hepatic lobe consistent with metastatic disease. Multiple masses are seen involving the pancreas, with the largest measuring 3.9 cm in the pancreatic tail. Common bile duct is mildly dilated at 8 mm concerning for distal common bile duct obstruction. Potentially this may be due to previously described masses. Gallbladder is not visualized. Calyceal dilatation is again noted involving the right kidney, with probable nonobstructive calculus seen in lower pole collecting system. Electronically Signed   By: Marijo Conception M.D.   On: 07/05/2019 12:56   CT ABDOMEN PELVIS W CONTRAST  Result Date: 06/28/2019 CLINICAL DATA:  Mediastinal mass on x-ray. Elevated LFTs. Left back pain. EXAM: CT CHEST, ABDOMEN, AND PELVIS WITH CONTRAST TECHNIQUE: Multidetector CT imaging of the chest, abdomen and pelvis was performed following the standard protocol during bolus administration of intravenous contrast. CONTRAST:  160m OMNIPAQUE IOHEXOL 300 MG/ML  SOLN COMPARISON:  Chest radiograph earlier this day. Remote abdominal CT 1126 FINDINGS: CT CHEST FINDINGS Cardiovascular: Aortic atherosclerosis. Aortic tortuosity without aneurysm. Heart is normal in size. Coronary artery calcifications. No pericardial effusion. Extensive mediastinal adenopathy causes mass effect and narrowing of the right pulmonary arteries. No obvious intraluminal filling defect. Mediastinum/Nodes: Extensive mediastinal and right hilar adenopathy, with confluent nodal conglomerate extending from the subcarinal station to the level of the mid trachea. Lymph nodes causes mass effect and narrowing of the right upper lobe bronchus bronchus  intermedius and both lower lobe bronchi. Representative measurement at the level of the carina of  6.8 x 5.3 cm, series 1, image 23. Subcarinal nodal conglomerate measures 3.4 x 5.2 cm, series 1, image 30. Prevascular nodal mass measuring 5.4 x 2.3 cm, series 1, image 25. Left supraclavicular adenopathy measuring 2 x 2 cm, series 1, image 5. Additional adenopathy at multiple stations. Many of these lymph nodes are partially calcified and low-density/necrotic. There is no left hilar adenopathy. There is no axillary adenopathy. Slight mass effect on the esophagus from nodal conglomerate, which is difficult to delineate in the mid distal portion. No visualized thyroid nodule. Lungs/Pleura: There are multiple right pulmonary nodules. It is difficult to delineate nodules from adenopathy in the central right lung. Adenopathy is irregular borders. Multiple branching nodules in the right upper lobe which may represent lymphangitic spread, for example series 3, image 54. There is a 10 mm medial right lower lobe pulmonary nodule, series 3, image 73. 9 mm right lower lobe nodule series 3, image 87. There is pleural based nodularity in the posterior right upper lobe that is contiguous with adenopathy. Adenopathy causes mass effect and narrowing of the right upper lobe bronchus, bronchus intermedius and right middle and lower lobe bronchi. No significant pleural effusion. No discrete left lung pulmonary nodules.  Mild emphysema. Musculoskeletal: No blastic or destructive lytic lesions. Right chest wall metastasis, series 1, image 35. CT ABDOMEN PELVIS FINDINGS Hepatobiliary: Large irregular hypodense liver lesion in the inferior liver tip measures 6.1 x 4.3 cm. There is right greater than left intrahepatic biliary ductal dilatation, likely due to mass effect from periportal adenopathy on the common bile duct. Gallbladder is not well visualized. Pancreas: Heterogeneous mass in the pancreatic body measuring 4.3 x 2.5 cm. There is  no distal atrophy. Mild proximal ductal dilatation of 5 mm. Spleen: Calcified granuloma.  Splenule inferiorly. Adrenals/Urinary Tract: Nodular thickening of the left adrenal gland up to 11 mm. Heterogeneous right adrenal mass measures 3.3 x 2.6 cm, increased in size from prior exam. There is prominent right renal parenchymal thinning with calyceal dilatation of the upper and lower poles. Large intra caliceal stones in the lower right kidney. There is no significant perinephric edema. Urinary bladder is distended but unremarkable. Stomach/Bowel: No obvious gastric mass. No small bowel obstruction or inflammatory change. High-density material throughout the colon may be contrast or bismuth containing products. Moderate stool proximally. No obvious colonic mass. Appendix is normal. Vascular/Lymphatic: Multifocal abdominal adenopathy. Periportal adenopathy with 3 x 2.3 cm lymph node, series 1, image 66. There are additional enlarged periportal nodes. Multiple enlarged upper abdominal nodes in the retroperitoneum and peripancreatic space. Representative right retroperitoneal node measures 3.0 x 1.8 cm, series 1, image 65. There is an enlarged central mesenteric node at 9 mm, series 1, image 75. Right retroperitoneal adenopathy causes mild mass effect on the IVC. There is a retrocrural node adjacent to the distal aorta. No pelvic adenopathy. Aortic and branch atherosclerosis. The portal vein is attenuated with mass effect from adjacent adenopathy. Reproductive: The uterus is not seen.  No obvious adnexal mass. Other: No ascites or omental thickening.  No free air. Musculoskeletal: Left paraspinal met measures 3.1 x 2.2 cm, series 168 at the level of L3-L4. No evidence of blastic or destructive lytic lesion. Scoliotic curvature of the spine. IMPRESSION: 1. 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 Site of primary malignancy is not definitively determined, however but favored to represent right  lung primary. There is also pancreatic lesion which may be an alternative primary. Tissue sampling is recommended. 2. Bulky mediastinal and  right hilar adenopathy, nodal conglomerate causes narrowing of the right upper lobe bronchus, right bronchus intermedius, upper and lower lobe bronchi. Multiple right-sided pulmonary nodules, including pleural based nodules posteriorly. 3. Large liver lesion suspicious for metastasis. There is biliary dilatation which is likely secondary to mass effect on the common bile duct from periportal adenopathy. 4. Multifocal abdominal adenopathy throughout the retroperitoneum, periportal region, central mesentery. 5. Pancreatic mass measuring 4.3 cm which may be metastasis or primary malignancy. 6. Right adrenal mass and left adrenal thickening. 7. Subcutaneous metastasis to the right chest wall. Left paraspinal intramuscular metastasis at the level of L3. 8. Non oncologic findings of right renal atrophy with large nonobstructing stones in the lower right kidney. Aortic Atherosclerosis (ICD10-I70.0) and Emphysema (ICD10-J43.9). Electronically Signed   By: Keith Rake M.D.   On: 06/28/2019 03:46   DG CHEST PORT 1 VIEW  Result Date: 07/10/2019 CLINICAL DATA:  Cough and dyspnea. EXAM: PORTABLE CHEST 1 VIEW COMPARISON:  Chest plain film and chest CT, dated Jun 28, 2019, are available for comparison. FINDINGS: The left lung is hyperinflated. Mild right-sided volume loss is seen. Mild diffuse chronic appearing increased lung markings are seen. Mild, predominant stable patchy opacities are seen overlying the mid right lung and right lung base. There is no evidence of a pleural effusion or pneumothorax. The cardiac silhouette is within normal limits. There is prominence of the right hilum. A 1.4 cm x 0.9 cm focal calcification is also seen within this region. The visualized skeletal structures are unremarkable. IMPRESSION: 1. Mild, predominant stable patchy opacities overlying the mid  right lung and right lung base. While this may represent areas of chronic interstitial lung disease, a superimposed acute infiltrate cannot be excluded. 2. Prominent right hilum which corresponds to the right hilar adenopathy seen on the prior chest CT dated Jun 28, 2019. Electronically Signed   By: Virgina Norfolk M.D.   On: 07/10/2019 15:36   ECHOCARDIOGRAM COMPLETE  Result Date: 06/28/2019    ECHOCARDIOGRAM REPORT   Patient Name:   Alisha Goodman Date of Exam: 06/28/2019 Medical Rec #:  [id number]       Height:       65.0 in Accession #:    [id number]      Weight:       129.0 lb Date of Birth:  08/31/1949        BSA:          1.642 m Patient Age:    74 years        BP:           133/81 mmHg Patient Gender: F               HR:           115 bpm. Exam Location:  Inpatient Procedure: 2D Echo, Cardiac Doppler, Color Doppler and Intracardiac            Opacification Agent Indications:    Chest Pain 786.50 / R07.9  History:        Patient has no prior history of Echocardiogram examinations.                 Risk Factors:Former Smoker.  Sonographer:    Vickie Epley RDCS Referring Phys: [id number] [facility] A THOM[facility]  Sonographer Comments: Suboptimal parasternal window. IMPRESSIONS  1. No apical thrombus by Definity contrast. Left ventricular ejection fraction, by estimation, is 20 to 25%. The left ventricle has severely decreased function. The left ventricle demonstrates global hypokinesis.  The left ventricular internal cavity size was mildly dilated. indeterminate diastolic function, due to what appears to be atrial flutter.  2. Right ventricular systolic function is hyperdynamic. The right ventricular size is normal.  3. The mitral valve is grossly normal. No evidence of mitral valve regurgitation.  4. The aortic valve was not well visualized. Aortic valve regurgitation is not visualized. Mild aortic valve sclerosis is present, with no evidence of aortic valve stenosis.  5. The inferior vena cava is normal in size with  greater than 50% respiratory variability, suggesting right atrial pressure of 3 mmHg. Conclusion(s)/Recommendation(s): Rhythm appears to be atrial flutter, I have also reviewed the EKG which demonstrates flutter waves. FINDINGS  Left Ventricle: No apical thrombus by Definity contrast. Left ventricular ejection fraction, by estimation, is 20 to 25%. The left ventricle has severely decreased function. The left ventricle demonstrates global hypokinesis. Definity contrast agent was  given IV to delineate the left ventricular endocardial borders. The left ventricular internal cavity size was mildly dilated. There is no left ventricular hypertrophy. Left ventricular diastolic function could not be evaluated due to possible atrial flutter. Indeterminate diastolic function, due to what appears to be atrial flutter. Right Ventricle: The right ventricular size is normal. No increase in right ventricular wall thickness. Right ventricular systolic function is hyperdynamic. Left Atrium: Left atrial size was normal in size. Right Atrium: Right atrial size was normal in size. Pericardium: There is no evidence of pericardial effusion. Mitral Valve: The mitral valve is grossly normal. No evidence of mitral valve regurgitation. Tricuspid Valve: The tricuspid valve is grossly normal. Tricuspid valve regurgitation is not demonstrated. Aortic Valve: The aortic valve was not well visualized. Aortic valve regurgitation is not visualized. Mild aortic valve sclerosis is present, with no evidence of aortic valve stenosis. Pulmonic Valve: The pulmonic valve was normal in structure. Pulmonic valve regurgitation is not visualized. Aorta: The aortic root and ascending aorta are structurally normal, with no evidence of dilitation. Venous: The inferior vena cava is normal in size with greater than 50% respiratory variability, suggesting right atrial pressure of 3 mmHg. IAS/Shunts: No atrial level shunt detected by color flow Doppler.  LEFT  VENTRICLE PLAX 2D LVIDd:         4.04 cm LVIDs:         3.62 cm LV PW:         0.76 cm LV IVS:        0.77 cm LVOT diam:     1.90 cm LV SV:         48 LV SV Index:   29 LVOT Area:     2.84 cm  LV Volumes (MOD) LV vol d, MOD A2C: 71.0 ml LV vol d, MOD A4C: 68.8 ml LV vol s, MOD A2C: 49.9 ml LV vol s, MOD A4C: 60.4 ml LV SV MOD A2C:     21.1 ml LV SV MOD A4C:     68.8 ml LV SV MOD BP:      14.9 ml RIGHT VENTRICLE TAPSE (M-mode): 2.2 cm LEFT ATRIUM             Index       RIGHT ATRIUM          Index LA diam:        2.50 cm 1.52 cm/m  RA Area:     8.27 cm LA Vol (A2C):   31.1 ml 18.94 ml/m RA Volume:   15.30 ml 9.32 ml/m LA Vol (A4C):   18.0 ml  10.96 ml/m LA Biplane Vol: 23.7 ml 14.44 ml/m  AORTIC VALVE LVOT Vmax:   113.00 cm/s LVOT Vmean:  73.900 cm/s LVOT VTI:    0.169 m  AORTA Ao Root diam: 2.90 cm  SHUNTS Systemic VTI:  0.17 m Systemic Diam: 1.90 cm Lyman Bishop MD Electronically signed by Lyman Bishop MD Signature Date/Time: 06/28/2019/3:34:15 PM    Final    [place] CORE BIOPSY (LYMPH NODES)  Result Date: 06/28/2019 INDICATION: 74 year old female with a 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 ANESTHESIA/SEDATION: None FLUOROSCOPY TIME:  None COMPLICATIONS: None PROCEDURE: Informed written consent was obtained from the patient after a thorough discussion of the procedural risks, benefits and alternatives. All questions were addressed. Maximal Sterile Barrier Technique was utilized including caps, mask, sterile gowns, sterile gloves, sterile drape, hand hygiene and skin antiseptic. A timeout was performed prior to the initiation of the procedure. Ultrasound survey was performed with images stored and sent to PACs. The left neck was prepped with chlorhexidine in a sterile fashion, and a sterile drape was applied covering the operative field. A sterile gown and sterile gloves were used for the  procedure. Local anesthesia was provided with 1% Lidocaine. Ultrasound guidance was used to infiltrate the region with 1% lidocaine for local anesthesia. A small stab incision was made with 11 blade scalpel. Using ultrasound guidance, multiple 18 gauge core biopsy were acquired of the pathologic left supraclavicular node. These were placed in day fresh specimen container. Final image was stored after biopsy. Patient tolerated the procedure well and remained hemodynamically stable throughout. No complications were encountered and no significant blood loss was encounter IMPRESSION: Status post ultrasound-guided biopsy of left supraclavicular pathologic node. Signed, Dulcy Fanny. Dellia Nims, RPVI [facility] Electronically Signed   By: Corrie Mckusick D.O.   On: 06/28/2019 17:11    ASSESSMENT AND PLAN: 1. 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 Jaundice secondary to external compression from portal adenopathy and pancreatic mass 3. Left hip and thigh pain related to her left paraspinal metastatic lesion 4. Weight loss and poor appetite 5. Constipation, improved 6. Leukocytosis, resolved 7. Anemia  -Prognosis from a cancer standpoint is poor, there is a high likelihood that she may not be able to get any systemic chemotherapy due to poor performance status and liver dysfunction. Will readdress treatment options once cholangitis treated. -LFTs remain elevated and total bilirubin continues to trend upward.  Continue IV antibiotics.  GI has seen the patient again today and hopes to proceed with ERCP later today. -Okay to continue radiation to her paraspinal mass for palliation of pain. -Dietitian following. Continue Remeron for appetite stimulation. -Continue Senokot-S. -Hemoglobin stable. We will continue to monitor this.   LOS: 3 days   Mikey Bussing, DNP,  AGPCNP-BC, AOCNP 07/11/19

## 2019-07-11 NOTE — Progress Notes (Signed)
Alisha Goodman  PROGRESS NOTE    Alisha Goodman  VPX:[id number] DOB: Dec 06, 1945 DOA: 07/08/2019 PCP: Alisha Croak, MD   Brief Narrative:   Alisha Don Websteris a 74 y.o.femalewith medical history significant ofmetastatic high-grade neuroendocrine tumor of the pancreas with multiple lymphadenopathies right adrenal mass bulky mediastinal and right hilar adenopathy who also appears to have obstructive jaundice probably from her malignancy. Patient has been referred to GI and was scheduled for ERCP with possible stenting on June 14. She was seen today in the cancer center with worsening symptoms. She got IV fluids. Was able to tolerate some juice and milk as well as some oatmeal. Patient however is deteriorating. She was sent over to the hospital at this point for admission evaluation and possible ERCP. Dr. Rush Goodman and GI because of GI aware. She has had rapid worsening of her LFTs and also new leukocytosis. There is worried that she may be developing acute cholangitis. She is also scheduled for palliative radiation with Dr. Lisbeth Goodman which will have started today. Prognosis is noted to be poor and family fully informed. We are admitting the patient with oncology to follow together with GI   07/10/19: She was unable to complete her ERCP d/t respiratory issues this AM. CXR shows possible superimposed opacity on chronic interstitial disease. If we were to call this HAP, she is already on zosyn. Check MRSA swap to see if we need to add vanc. Add guaifenesin and tesslon.   6/10: From a respiratory standpoint, she seems ok today. Wean O2 as able. MRSA swab was negative. Will hold off on adding vanc for now. She needs better BP control. Resume her metoprolol at her home dosing. She reported metoprolol XL 25mg  TID, but it's prescribed as 75mg  qday. Will change to 75mg  qday. To have ERCP this afternoon. Appreciate GI assistance.    Assessment & Plan:   Principal Problem:   Obstructive jaundice Active  Problems:   Atrial flutter (HCC)   Hyponatremia   Emphysema of lung (HCC)   Chronic systolic CHF (congestive heart failure) (HCC)   High grade neuroendocrine carcinoma (HCC)   Malnutrition of moderate degree  Metastatic pancreatic cancer with diffuse mets to the liver, right adrenal mass, right chest wall, lumbar spine with abdominal lymph adenopathy  - Abdominal ultrasound 07/05/2019 dilated CBD concerning for obstruction due to mass-effect 6.1 cm right liver mass and multiple mass in the pancreas.   - Status post radiation 6/7 with plans for chemotherapy by Alisha Goodman.  - She was admitted from Alisha Goodman office with elevated LFTs and leukocytosis.  - GI onboard for ERCP  - 6/9: unable to perform ERCP d/y respiratory distress; CXR obtained, possible PNA, currently on zosyn, check MRSA swab  - 6/10: respiratory status is improved; wean O2. MRSA swab negative. Continue zosyn  Constipation   - continue senna  Normocytic anemia   - hemoglobin 10.3 on admit  - No evidence of active bleeding.  - monitor  History of essential hypertension  - soft pressures at admission  - home meds Avapro, Catapres, metoprolol, Aldactone.  - Avapro, Catapres and Aldactone were stopped; metoprolol was continued  - 6/9: BP is up today, resume avapro  - 6/10: increase metoprolol to home dosing (she's prescribed metoprolol xl 75mg  qday but takes it as 25mg  TID).   History of hyperlipidemia   - not on statins; follow  History of chronic systolic CHF with ejection fraction 20 to 25%.    - Euvolemic.  - 6/9: d/c IVF  as she is hypertensive  hyponatremia  - sodium 127 on admission up to 129 with normal saline.  - 6/9: Na+ is 130. Hold fluids. Monitor.  - 6/10: Na+ is 131, follow  Sinus tachycardia multifactorial   - continue beta-blocker and continue pain control  - 6/10: increase metoprolol to home dose  Tobacco use   - continue nicotine patch  Decreased appetite  - continue Remeron   DVT  prophylaxis: lovenox Code Status: FULL Family Communication: Spoke with dtr, son at bedside   Status is: Inpatient  Remains inpatient appropriate because:Inpatient level of care appropriate due to severity of illness   Dispo: The patient is from: Home              Anticipated d/c is to: Home              Anticipated d/c date is: 3 days              Patient currently is not medically stable to d/c.  Consultants:   GI  Oncology  Procedures:   ERCP  Antimicrobials:   zosyn   ROS:  Denies CP, dyspnea, N, V, ab pain . Remainder 10-pt ROS is negative for all not previously mentioned.  Subjective: "It doesn't hurt as much today."  Objective: Vitals:   07/11/19 0612 07/11/19 1001 07/11/19 1340 07/11/19 1445  BP: 137/70 (!) 155/68 (!) 163/99 (!) 175/72  Pulse: (!) 124 (!) 122 (!) 125 (!) 131  Resp: 18 20 18  (!) 28  Temp: 97.7 F (36.5 C) 98.8 F (37.1 C) 98.3 F (36.8 C) 99.2 F (37.3 C)  TempSrc: Oral Oral Oral Axillary  SpO2: 98% 98% 97% 97%  Weight:      Height:        Intake/Output Summary (Last 24 hours) at 07/11/2019 1618 Last data filed at 07/11/2019 1002 Gross per 24 hour  Intake 240 ml  Output --  Net 240 ml   Filed Weights   07/08/19 1755  Weight: 53.3 kg    Examination:  General: 74 y.o. female resting in bed in NAD Cardiovascular: tachy, +S1, S2, no m/g/r, equal pulses throughout Respiratory: better air movement today, slight rhonchi on right, normal WOB GI: BS+, NDNT, no masses noted, no organomegaly noted MSK: No e/c/c Neuro: A&O x 3, no focal deficits Psyc: Appropriate interaction and affect, calm/cooperative   Data Reviewed: I have personally reviewed following labs and imaging studies.  CBC: Recent Labs  Lab 07/08/19 1219 07/09/19 0734 07/10/19 0557 07/11/19 0608  WBC 15.9* 13.1* 10.3 10.5  NEUTROABS 12.8*  --  8.8* 9.3*  HGB 10.3* 9.7* 9.2* 9.4*  HCT 30.6* 29.0* 27.5* 28.2*  MCV 81.4 82.4 81.4 81.0  PLT 548* 519* 521* 591*    Basic Metabolic Panel: Recent Labs  Lab 07/08/19 1219 07/09/19 0734 07/10/19 0557 07/11/19 0608  NA 127* 129* 130* 131*  K 4.6 4.2 4.1 4.1  CL 98 99 101 100  CO2 19* 17* 17* 18*  GLUCOSE 120* 107* 107* 101*  BUN 14 19 11 11   CREATININE 0.89 0.75 0.47 0.35*  CALCIUM 9.6 8.5* 8.4* 9.0  MG  --   --   --  2.3   GFR: Estimated Creatinine Clearance: 51.9 mL/min (A) (by C-G formula based on SCr of 0.35 mg/dL (L)). Liver Function Tests: Recent Labs  Lab 07/08/19 1219 07/09/19 0734 07/10/19 0557 07/11/19 0608  AST 363* 191* 155* 130*  ALT 706* 457* 311* 252*  ALKPHOS 541* 389* 326* 326*  BILITOT 10.9* 11.6* 11.9* 14.7*  PROT 8.1 7.6 7.2 7.7  ALBUMIN 2.6* 2.9* 2.6* 2.6*   No results for input(s): LIPASE, AMYLASE in the last 168 hours. No results for input(s): AMMONIA in the last 168 hours. Coagulation Profile: Recent Labs  Lab 07/10/19 0557  INR 1.2   Cardiac Enzymes: No results for input(s): CKTOTAL, CKMB, CKMBINDEX, TROPONINI in the last 168 hours. BNP (last 3 results) No results for input(s): PROBNP in the last 8760 hours. HbA1C: No results for input(s): HGBA1C in the last 72 hours. CBG: Recent Labs  Lab 07/10/19 1159  GLUCAP 96   Lipid Profile: No results for input(s): CHOL, HDL, LDLCALC, TRIG, CHOLHDL, LDLDIRECT in the last 72 hours. Thyroid Function Tests: No results for input(s): TSH, T4TOTAL, FREET4, T3FREE, THYROIDAB in the last 72 hours. Anemia Panel: No results for input(s): VITAMINB12, FOLATE, FERRITIN, TIBC, IRON, RETICCTPCT in the last 72 hours. Sepsis Labs: No results for input(s): PROCALCITON, LATICACIDVEN in the last 168 hours.  Recent Results (from the past 240 hour(s))  SARS Coronavirus 2 by RT PCR (hospital order, performed in [facility] hospital lab) Nasopharyngeal Nasopharyngeal Swab     Status: None   Collection Time: 07/09/19 12:30 PM   Specimen: Nasopharyngeal Swab  Result Value Ref Range Status   SARS Coronavirus 2 NEGATIVE  NEGATIVE Final    Comment: (NOTE) SARS-CoV-2 target nucleic acids are NOT DETECTED. The SARS-CoV-2 RNA is generally detectable in upper and lower respiratory specimens during the acute phase of infection. The lowest concentration of SARS-CoV-2 viral copies this assay can detect is 250 copies / mL. A negative result does not preclude SARS-CoV-2 infection and should not be used as the sole basis for treatment or other patient management decisions.  A negative result may occur with improper specimen collection / handling, submission of specimen other than nasopharyngeal swab, presence of viral mutation(s) within the areas targeted by this assay, and inadequate number of viral copies (<250 copies / mL). A negative result must be combined with clinical observations, patient history, and epidemiological information. Fact Sheet for Patients:   StrictlyIdeas.no Fact Sheet for Healthcare Providers: BankingDealers.co.za This test is not yet approved or cleared  by the [place] FDA and has been authorized for detection and/or diagnosis of SARS-CoV-2 by FDA under an Emergency Use Authorization (EUA).  This EUA will remain in effect (meaning this test can be used) for the duration of the COVID-19 declaration under Section 564(b)(1) of the Act, 21 U.S.C. section 360bbb-3(b)(1), unless the authorization is terminated or revoked sooner. Performed at [facility], [place] [address] [place], [place] [address]   MRSA PCR Screening     Status: None   Collection Time: 07/10/19  6:03 PM   Specimen: Nasal Mucosa; Nasopharyngeal  Result Value Ref Range Status   MRSA by PCR NEGATIVE NEGATIVE Final    Comment:        The GeneXpert MRSA Assay (FDA approved for NASAL specimens only), is one component of a comprehensive MRSA colonization surveillance program. It is not intended to diagnose MRSA infection nor to guide or monitor treatment  for MRSA infections. Performed at [facility], [place] [address] [place], [place] [address]       Radiology Studies: DG CHEST PORT 1 VIEW  Result Date: 07/10/2019 CLINICAL DATA:  Cough and dyspnea. EXAM: PORTABLE CHEST 1 VIEW COMPARISON:  Chest plain film and chest CT, dated Jun 28, 2019, are available for comparison. FINDINGS: The left lung is hyperinflated. Mild  right-sided volume loss is seen. Mild diffuse chronic appearing increased lung markings are seen. Mild, predominant stable patchy opacities are seen overlying the mid right lung and right lung base. There is no evidence of a pleural effusion or pneumothorax. The cardiac silhouette is within normal limits. There is prominence of the right hilum. A 1.4 cm x 0.9 cm focal calcification is also seen within this region. The visualized skeletal structures are unremarkable. IMPRESSION: 1. Mild, predominant stable patchy opacities overlying the mid right lung and right lung base. While this may represent areas of chronic interstitial lung disease, a superimposed acute infiltrate cannot be excluded. 2. Prominent right hilum which corresponds to the right hilar adenopathy seen on the prior chest CT dated Jun 28, 2019. Electronically Signed   By: Virgina Norfolk M.D.   On: 07/10/2019 15:36     Scheduled Meds:  [MAR Hold] enoxaparin (LOVENOX) injection  40 mg Subcutaneous Q24H   [MAR Hold] feeding supplement  1 Container Oral TID BM   [MAR Hold] feeding supplement (ENSURE ENLIVE)  237 mL Oral BID BM   [MAR Hold] guaiFENesin  600 mg Oral BID   indomethacin  100 mg Rectal Once   [MAR Hold] irbesartan  150 mg Oral Daily   [MAR Hold] metoprolol succinate  25 mg Oral Daily   [MAR Hold] mirtazapine  7.5 mg Oral QHS   [MAR Hold] nicotine  21 mg Transdermal Daily   [MAR Hold] oxyCODONE  15 mg Oral Q12H   [MAR Hold] senna-docusate  1 tablet Oral QHS   [MAR Hold] senna-docusate  2 tablet Oral Daily   Continuous  Infusions:  sodium chloride     [MAR Hold] piperacillin-tazobactam (ZOSYN)  IV 3.375 g (07/11/19 0825)     LOS: 3 days    Time spent: 35 minutes spent in the coordination of care today.    Jonnie Finner, DO [facility]  If 7PM-7AM, please contact night-coverage [email or web address] 07/11/2019, 4:18 PM

## 2019-07-11 NOTE — Op Note (Addendum)
[facility] Patient Name: Alisha Goodman Procedure Date: 07/11/2019 MRN: [id number] Attending MD: Ladene Artist , MD Date of Birth: April 03, 1945 CSN: [id number] Age: 74 Admit Type: Outpatient Procedure:                ERCP Indications:              Obstructive jaundice, Abnormal imaging of the                            biliary tree, Malignant stricture of the common                            bile duct, Malignant stricture the right hepatic                            ducts, Elevated liver enzymes. Providers:                Pricilla Riffle. Fuller Plan, MD, Cleda Daub, RN, Grace Isaac, RN Referring MD:             [facility] Medicines:                General Anesthesia Complications:            No immediate complications. Estimated Blood Loss:     Estimated blood loss: none. Procedure:                Pre-Anesthesia Assessment:                           - Prior to the procedure, a History and Physical                            was performed, and patient medications and                            allergies were reviewed. The patient's tolerance of                            previous anesthesia was also reviewed. The risks                            and benefits of the procedure and the sedation                            options and risks were discussed with the patient.                            All questions were answered, and informed consent                            was obtained. Prior Anticoagulants: The patient has                            taken no  previous anticoagulant or antiplatelet                            agents. ASA Grade Assessment: IV - A patient with                            severe systemic disease that is a constant threat                            to life. After reviewing the risks and benefits,                            the patient was deemed in satisfactory condition to                            undergo the procedure.                            After obtaining informed consent, the scope was                            passed under direct vision. Throughout the                            procedure, the patient's blood pressure, pulse, and                            oxygen saturations were monitored continuously. The                            [id number]-[id number] ([id number] Olympus Doudenoscope was                            introduced through the mouth, and used to inject                            contrast into and used to inject contrast into the                            bile duct. The ERCP was accomplished without                            difficulty. The patient tolerated the procedure                            well. Scope In: Scope Out: Findings:      The scout film was normal. The scope was advanced to a normal major       papilla in the descending duodenum. Examination of the pharynx, larynx       and associated structures, and upper GI tract was normal. The major       papilla was normal. The minor papilla was not seen. A straight       Roadrunner wire was passed into the biliary tree. The short-nosed  traction sphincterotome was passed over the guidewire and the common       bile duct was then deeply cannulated. Contrast was injected. I       personally interpreted the bile duct images. There was appropriate flow       of contrast through all ducts except the right intrahepatic ducts. The       proximal common bile duct, left hepatic/left intrahepatic and right       intrahepatic ducts were moderately dilated, with malignant appearing       strictures causing obstructions. The largest CBD diameter was 12 mm. The       left hepatic system filled promptly with diffuse dilation however the       right hepatic system filled slowly and incompletely. The right hepatic       duct stricture and mid CBD stricture were both severe. I was able to       advance the guidewire into the right intrahepatic system.  Attempted to       place one 7 Fr, 12 cm plastic stent however it was too long and it was       removed. One 7 Fr by 9 cm stent with a single external flap and a single       internal flap was placed 8 cm into the common bile duct and into the       right intrahepatic ducts. Bile flowed through the stent. The stent was       in good position across both strictures. The PD was not entered by       intention. Impression:               - The major papilla appeared normal.                           - The right intrahepatic ducts, left                            hepatic/intrahepatic ducts and proximal common bile                            duct were moderately dilated, with a right hepatic                            duct stricture and a mid common bile duct stricture                            causing obstruction.                           - One stent was placed into the common bile duct                            and right intrahepatic duct across both strictures. Moderate Sedation:      Not Applicable - Patient had care per Anesthesia. Recommendation:           - Clear liquid diet today.                           - Return patient to hospital ward for ongoing  care.                           - Observe patient's clinical course following                            today's ERCP with therapeutic intervention.                           - Trend LFTs. Procedure Code(s):        --- Professional ---                           [phone], Endoscopic retrograde                            cholangiopancreatography (ERCP); with placement of                            endoscopic stent into biliary or pancreatic duct,                            including pre- and post-dilation and guide wire                            passage, when performed, including sphincterotomy,                            when performed, each stent Diagnosis Code(s):        --- Professional ---                           K83.1, Obstruction  of bile duct                           R17, Unspecified jaundice                           R74.8, Abnormal levels of other serum enzymes CPT copyright 2019 American Medical Association. All rights reserved. The codes documented in this report are preliminary and upon coder review may  be revised to meet current compliance requirements. Ladene Artist, MD 07/11/2019 4:52:12 PM This report has been signed electronically. Number of Addenda: 0

## 2019-07-11 NOTE — Care Management Important Message (Signed)
Important Message  Patient Details IM Letter given to Marney Doctor RN Case Manager to present to the Patient Name: Alisha Goodman MRN: [id number] Date of Birth: Oct 29, 1945   Medicare Important Message Given:  Yes     Kerin Salen 07/11/2019, 10:42 AM

## 2019-07-11 NOTE — Progress Notes (Addendum)
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  Still with some cough but no significant dyspnea at rest.  No abdominal pain, nausea, vomiting. ° °Objective:  °Vital signs in last 24 hours: °Temp:  [97.5 °F (36.4 °C)-99.2 °F (37.3 °C)] 97.7 °F (36.5 °C) (06/10 0612) °Pulse Rate:  [100-126] 124 (06/10 0612) °Resp:  [18-24] 18 (06/10 0612) °BP: (137-165)/(70-93) 137/70 (06/10 0612) °SpO2:  [94 %-98 %] 98 % (06/10 0612) °Last BM Date: 07/07/19 °General:  Alert, Well-developed, in NAD; scleral icterus present °Heart:  Tachy. °Pulm:  No wheezing noted.  No increased WOB. °Abdomen:  Soft, non-distended.  BS present.  Non-tender. °Extremities:  Without edema. °Neurologic:  Alert and  oriented x4;  grossly normal neurologically. °Psych:  Alert and cooperative. Normal mood and affect. ° °Lab Results: °Recent Labs  °  07/09/19 °0734 07/10/19 °0557 07/11/19 °0608  °WBC 13.1* 10.3 10.5  °HGB 9.7* 9.2* 9.4*  °HCT 29.0* 27.5* 28.2*  °PLT 519* 521* 591*  ° °BMET °Recent Labs  °  07/09/19 °0734 07/10/19 °0557 07/11/19 °0608  °NA 129* 130* 131*  °K 4.2 4.1 4.1  °CL 99 101 100  °CO2 17* 17* 18*  °GLUCOSE 107* 107* 101*  °BUN 19 11 11  °CREATININE 0.75 0.47 0.35*  °CALCIUM 8.5* 8.4* 9.0  ° °LFT °Recent Labs  °  07/10/19 °0557 07/10/19 °0557 07/11/19 °0608  °PROT 7.2   < > 7.7  °ALBUMIN 2.6*   < > 2.6*  °AST 155*   < > 130*  °ALT 311*   < > 252*  °ALKPHOS 326*   < > 326*  °BILITOT 11.9*   < > 14.7*  °BILIDIR 7.7*  --   --   °IBILI 4.2*  --   --   ° < > = values in this interval not displayed.  ° °PT/INR °Recent Labs  °  07/10/19 °0557  °LABPROT 14.5  °INR 1.2  ° °DG CHEST PORT 1 VIEW ° °Result Date: 07/10/2019 °CLINICAL DATA:  Cough and dyspnea. EXAM: PORTABLE CHEST 1 VIEW COMPARISON:  Chest plain film and chest CT, dated Jun 28, 2019, are available for comparison. FINDINGS: The left lung is hyperinflated. Mild  right-sided volume loss is seen. Mild diffuse chronic appearing increased lung markings are seen. Mild, predominant stable patchy opacities are seen overlying the mid right lung and right lung base. There is no evidence of a pleural effusion or pneumothorax. The cardiac silhouette is within normal limits. There is prominence of the right hilum. A 1.4 cm x 0.9 cm focal calcification is also seen within this region. The visualized skeletal structures are unremarkable. IMPRESSION: 1. Mild, predominant stable patchy opacities overlying the mid right lung and right lung base. While this may represent areas of chronic interstitial lung disease, a superimposed acute infiltrate cannot be excluded. 2. Prominent right hilum which corresponds to the right hilar adenopathy seen on the prior chest CT dated Jun 28, 2019. Electronically Signed   By: Thaddeus  Houston M.D.   On: 07/10/2019 15:36  ° °Assessment / Plan: °1.  74 year old female recently diagnosed with neuroendocrine pancreatic cancer with diffuse metastasis presents with elevated T. Bili, alk phos and LFTs with leukocytosis concerning for biliary obstruction at risk for ascending cholangitis. CTAP 06/28/2019 showed a 4.3 cm pancreatic mass, a 6.1 x 4.3cm liver mass and intrahepatic biliary ductal dilatation secondary to mass effect on the common bile duct   from periportal adenopathy. An abdominal sonogram 6/4 showed a 6.1 cm right hepatic liver mass, multiple masses in the pancreas and a dilated CBD at 8cm.  Total bili increasing. °-Will see if anesthesia can evaluate the patient on the floor today and possibly clear her for ERCP later this afternoon.  Will likely need left and right hepatic biliary system drainage either via ERCP with stents and/or PTC. °-Zosyn 3.375mg IV Q 8 hrs  °  °2.  Normocytic anemia. Hgb stable. °  °3. 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  Hyponatremia. Na+ 127 -> 129->130->131. °  °5. CHF, LV EF 20 -25% °  °6.  Constipation °-Miralax Q HS PRN  ° ° LOS: 3 days  ° °Jessica D. Zehr  07/11/2019, 9:14 AM  ° ° ° Attending Physician Note  ° °I have taken an interval history, reviewed the chart and examined the patient. I agree with the Advanced Practitioner's note, impression and recommendations.  ° °* Respiratory status improved today. ERCP later today is possible if cleared by anesthesia  °* IR consult for consideration of PTC if unable to proceed with ERCP or ERCP performed and unable to bridge all obstructed areas  ° °Kanylah Muench, MD FACG °[facility] ° ° °

## 2019-07-11 NOTE — Interval H&P Note (Signed)
History and Physical Interval Note:  07/11/2019 3:28 PM  Alisha Goodman  has presented today for surgery, with the diagnosis of Biliary obstruction.  The various methods of treatment have been discussed with the patient and family. After consideration of risks, benefits and other options for treatment, the patient has consented to  Procedure(s): ENDOSCOPIC RETROGRADE CHOLANGIOPANCREATOGRAPHY (ERCP) (N/A) as a surgical intervention.  The patient's history has been reviewed, patient examined, no change in status, stable for surgery.  I have reviewed the patient's chart and labs.  Questions were answered to the patient's satisfaction.     Pricilla Riffle. Fuller Plan

## 2019-07-11 NOTE — Anesthesia Procedure Notes (Signed)
Procedure Name: Intubation Date/Time: 07/11/2019 3:37 PM Performed by: Gerald Leitz, CRNA Pre-anesthesia Checklist: Patient identified, Patient being monitored, Timeout performed, Emergency Drugs available and Suction available Patient Re-evaluated:Patient Re-evaluated prior to induction Oxygen Delivery Method: Circle system utilized Preoxygenation: Pre-oxygenation with 100% oxygen Induction Type: IV induction Ventilation: Mask ventilation without difficulty Laryngoscope Size: Mac and 3 Grade View: Grade I Tube type: Oral Tube size: 7.0 mm Number of attempts: 1 Placement Confirmation: ETT inserted through vocal cords under direct vision,  positive ETCO2 and breath sounds checked- equal and bilateral Secured at: 21 cm Tube secured with: Tape Dental Injury: Teeth and Oropharynx as per pre-operative assessment

## 2019-07-11 NOTE — Anesthesia Postprocedure Evaluation (Signed)
Anesthesia Post Note  Patient: MYKEL MOHL  Procedure(s) Performed: ENDOSCOPIC RETROGRADE CHOLANGIOPANCREATOGRAPHY (ERCP) (N/A ) PANCREATIC STENT PLACEMENT     Patient location during evaluation: Endoscopy Anesthesia Type: General Level of consciousness: awake and alert, oriented and patient cooperative Pain management: pain level controlled Vital Signs Assessment: post-procedure vital signs reviewed and stable Respiratory status: spontaneous breathing, nonlabored ventilation, respiratory function stable and patient connected to nasal cannula oxygen Cardiovascular status: blood pressure returned to baseline and stable Postop Assessment: no apparent nausea or vomiting Anesthetic complications: no   No complications documented.  Last Vitals:  Vitals:   07/11/19 1646 07/11/19 1650  BP:  (!) 137/51  Pulse: (!) 112 (!) 114  Resp: (!) 30 (!) 21  Temp:    SpO2: 95% 94%    Last Pain:  Vitals:   07/11/19 1650  TempSrc:   PainSc: 0-No pain                 Zunairah Devers,E. Aboubacar Matsuo

## 2019-07-12 ENCOUNTER — Encounter (HOSPITAL_COMMUNITY): Payer: Self-pay | Admitting: Gastroenterology

## 2019-07-12 ENCOUNTER — Ambulatory Visit
Admission: RE | Admit: 2019-07-12 | Discharge: 2019-07-12 | Disposition: A | Discharge status: Home / Self Care | Payer: Medicare (Managed Care) | Source: Ambulatory Visit | Attending: Radiation Oncology | Admitting: Radiation Oncology

## 2019-07-12 ENCOUNTER — Ambulatory Visit: Payer: Medicare (Managed Care)

## 2019-07-12 DIAGNOSIS — R7989 Other specified abnormal findings of blood chemistry: Secondary | ICD-10-CM

## 2019-07-12 DIAGNOSIS — C787 Secondary malignant neoplasm of liver and intrahepatic bile duct: Secondary | ICD-10-CM

## 2019-07-12 DIAGNOSIS — Z7189 Other specified counseling: Secondary | ICD-10-CM

## 2019-07-12 DIAGNOSIS — Z515 Encounter for palliative care: Secondary | ICD-10-CM

## 2019-07-12 DIAGNOSIS — K5900 Constipation, unspecified: Secondary | ICD-10-CM

## 2019-07-12 DIAGNOSIS — K831 Obstruction of bile duct: Secondary | ICD-10-CM

## 2019-07-12 DIAGNOSIS — R531 Weakness: Secondary | ICD-10-CM

## 2019-07-12 DIAGNOSIS — J439 Emphysema, unspecified: Secondary | ICD-10-CM

## 2019-07-12 LAB — CBC WITH DIFFERENTIAL/PLATELET
Abs Immature Granulocytes: 0.08 10*3/uL — ABNORMAL HIGH (ref 0.00–0.07)
Basophils Absolute: 0 10*3/uL (ref 0.0–0.1)
Basophils Relative: 0 %
Eosinophils Absolute: 0 10*3/uL (ref 0.0–0.5)
Eosinophils Relative: 0 %
HCT: 27.3 % — ABNORMAL LOW (ref 36.0–46.0)
Hemoglobin: 9.3 g/dL — ABNORMAL LOW (ref 12.0–15.0)
Immature Granulocytes: 1 %
Lymphocytes Relative: 4 %
Lymphs Abs: 0.4 10*3/uL — ABNORMAL LOW (ref 0.7–4.0)
MCH: 27.4 pg (ref 26.0–34.0)
MCHC: 34.1 g/dL (ref 30.0–36.0)
MCV: 80.3 fL (ref 80.0–100.0)
Monocytes Absolute: 0.6 10*3/uL (ref 0.1–1.0)
Monocytes Relative: 7 %
Neutro Abs: 8.6 10*3/uL — ABNORMAL HIGH (ref 1.7–7.7)
Neutrophils Relative %: 88 %
Platelets: 579 10*3/uL — ABNORMAL HIGH (ref 150–400)
RBC: 3.4 MIL/uL — ABNORMAL LOW (ref 3.87–5.11)
RDW: 17.8 % — ABNORMAL HIGH (ref 11.5–15.5)
WBC: 9.7 10*3/uL (ref 4.0–10.5)
nRBC: 0 % (ref 0.0–0.2)

## 2019-07-12 LAB — COMPREHENSIVE METABOLIC PANEL
ALT: 198 U/L — ABNORMAL HIGH (ref 0–44)
AST: 92 U/L — ABNORMAL HIGH (ref 15–41)
Albumin: 2.6 g/dL — ABNORMAL LOW (ref 3.5–5.0)
Alkaline Phosphatase: 295 U/L — ABNORMAL HIGH (ref 38–126)
Anion gap: 13 (ref 5–15)
BUN: 22 mg/dL (ref 8–23)
CO2: 19 mmol/L — ABNORMAL LOW (ref 22–32)
Calcium: 8.7 mg/dL — ABNORMAL LOW (ref 8.9–10.3)
Chloride: 100 mmol/L (ref 98–111)
Creatinine, Ser: 0.63 mg/dL (ref 0.44–1.00)
GFR calc Af Amer: >60 mL/min (ref >60)
GFR calc non Af Amer: >60 mL/min (ref >60)
Glucose, Bld: 137 mg/dL — ABNORMAL HIGH (ref 70–99)
Potassium: 3.9 mmol/L (ref 3.5–5.1)
Sodium: 132 mmol/L — ABNORMAL LOW (ref 135–145)
Total Bilirubin: 8.7 mg/dL — ABNORMAL HIGH (ref 0.3–1.2)
Total Protein: 7.6 g/dL (ref 6.5–8.1)

## 2019-07-12 LAB — MAGNESIUM: Magnesium: 2.4 mg/dL (ref 1.7–2.4)

## 2019-07-12 NOTE — Progress Notes (Addendum)
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  Sitting up in bed, more talkative.  Objective:  Vital signs in last 24 hours: Temp:  [97.8 F (36.6 C)-99.2 F (37.3 C)] 97.9 F (36.6 C) (06/11 0531) Pulse Rate:  [106-131] 106 (06/11 0531) Resp:  [18-31] 19 (06/10 2027) BP: (122-175)/(50-99) 133/82 (06/11 0531) SpO2:  [94 %-98 %] 94 % (06/11 0531) Last BM Date: 07/09/19 General:  Alert, Well-developed, in NAD Heart:  Slightly tachy. Pulm:  CTAB.  No increased WOB. Abdomen:  Soft, non-distended.  BS present.  Non-tender. Extremities:  Without edema. Neurologic:  Alert and oriented x 4;  grossly normal neurologically. Psych:  Alert and cooperative. Normal mood and affect.  Intake/Output from previous day: 06/10 0701 - 06/11 0700 In: 1220 [P.O.:720; I.V.:500] Out: -   Lab Results: Recent Labs    07/10/19 0557 07/11/19 0608 07/12/19 0554  WBC 10.3 10.5 9.7  HGB 9.2* 9.4* 9.3*  HCT 27.5* 28.2* 27.3*  PLT 521* 591* 579*   BMET Recent Labs    07/10/19 0557 07/11/19 0608 07/12/19 0554  NA 130* 131* 132*  K 4.1 4.1 3.9  CL 101 100 100  CO2 17* 18* 19*  GLUCOSE 107* 101* 137*  BUN _0 CREATININE 0.47 0.35* 0.63  CALCIUM 8.4* 9.0 8.7*   LFT Recent Labs    07/10/19 0557 07/11/19 0608 07/12/19 0554  PROT 7.2   < > 7.6  ALBUMIN 2.6*   < > 2.6*  AST 155*   < > 92*  ALT 311*   < > 198*  ALKPHOS 326*   < > 295*  BILITOT 11.9*   < > 8.7*  BILIDIR 7.7*  --   --   IBILI 4.2*  --   --    < > = values in this interval not displayed.   PT/INR Recent Labs    07/10/19 0557  LABPROT 14.5  INR 1.2   DG CHEST PORT 1 VIEW  Result Date: 07/10/2019 CLINICAL DATA:  Cough and dyspnea. EXAM: PORTABLE CHEST 1 VIEW COMPARISON:  Chest plain film and chest CT, dated Jun 28, 2019, are available for comparison.  FINDINGS: The left lung is hyperinflated. Mild right-sided volume loss is seen. Mild diffuse chronic appearing increased lung markings are seen. Mild, predominant stable patchy opacities are seen overlying the mid right lung and right lung base. There is no evidence of a pleural effusion or pneumothorax. The cardiac silhouette is within normal limits. There is prominence of the right hilum. A 1.4 cm x 0.9 cm focal calcification is also seen within this region. The visualized skeletal structures are unremarkable. IMPRESSION: 1. Mild, predominant stable patchy opacities overlying the mid right lung and right lung base. While this may represent areas of chronic interstitial lung disease, a superimposed acute infiltrate cannot be excluded. 2. Prominent right hilum which corresponds to the right hilar adenopathy seen on the prior chest CT dated Jun 28, 2019. Electronically Signed   By: Virgina Norfolk M.D.   On: 07/10/2019 15:36   DG ERCP BILIARY & PANCREATIC DUCTS  Result Date: 07/11/2019 CLINICAL DATA:  ERCP. EXAM: ERCP TECHNIQUE: Multiple spot images obtained with the fluoroscopic device and submitted for interpretation post-procedure. FLUOROSCOPY TIME:  Fluoroscopy Time:  3 minutes and 36 seconds Number of Acquired Spot Images: 20 COMPARISON:  CT dated May  28 21 FINDINGS: There was cannulation of the common bile duct. There is significant narrowing of the mid and distal common bile duct with intrahepatic biliary ductal dilatation. Final images demonstrate placement of a plastic biliary stent. IMPRESSION: Status post ERCP as above. These images were submitted for radiologic interpretation only. Please see the procedural report for the amount of contrast and the fluoroscopy time utilized. Electronically Signed   By: Constance Holster M.D.   On: 07/11/2019 19:13   Assessment / Plan: 7. 74 year old female recently diagnosed with neuroendocrine pancreatic cancer with diffuse metastasis presents with elevated t.  bili, alk phos and LFTs with leukocytosis concerning for biliary obstruction at risk for ascending cholangitis. CTAP 06/28/2019 showed a 4.3 cm pancreatic mass, a 6.1 x 4.3cmlivermass and intrahepatic biliary ductaldilatation secondary to mass effect on the common bile duct from periportal adenopathy. An abdominal sonogram 6/4 showed a 6.1 cm right hepatic liver mass, multiple masses in the pancreas and a dilated CBD at 8cm.  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  LFTs trending nicely with total bilirubin decreasing from 14.7 to 8.7.  2. Normocytic anemia. Hgb stable.  3. 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 Hyponatremia:  Improving.  5. CHF, LV EF 20 -25%  -Continue to trend LFTs. -Would discharge with 5 days or PO abx. -Signing off from a GI standpoint.  No GI follow-up needed.   LOS: 4 days   Laban Emperor. Zehr  07/12/2019, 9:01 AM     Attending Physician Note   I have taken an interval history, reviewed the chart and examined the patient. I agree with the Advanced Practitioner's note, impression and recommendations.   Feeling much better today. Stent functioning well with LFTs significantly improved.  Advance diet Trend LFTs every two weeks for next several weeks as outpatient per Oncology   5 days of PO antibiotics at discharge Follow up with Oncology as planned. GI follow up prn. GI signing off.   Lucio Edward, MD [facility]

## 2019-07-12 NOTE — Progress Notes (Addendum)
Palliative Medicine Team Consultation Note.   Alisha Goodman is a 74 yo lady with a serious illness diagnosis of neuroendocrine tumor of pancreas. A palliative consult has been requested for goals of care. Patient observed to be resting comfortably, subsequently, she was being evaluated by GI this morning.   She is being followed by GI and med onc. PMT has been in discussions with family about scope of palliative care services.   Chart reviewed in detail, patient discussed during IDT.   Call placed and discussed with daughter Waldron Session at [phone]. I introduced myself and palliative care as follows: Palliative medicine is specialized medical care for people living with serious illness. It focuses on providing relief from the symptoms and stress of a serious illness. The goal is to improve quality of life for both the patient and the family.  Goals of care: Broad aims of medical therapy in relation to the patient's values and preferences. Our aim is to provide medical care aimed at enabling patients to achieve the goals that matter most to them, given the circumstances of their particular medical situation and their constraints.   We reviewed about the patient's current condition, her underlying illness and broad goals of care options. Goals, wishes ans values important to the patient and family as a unit attempted to be explored.   Family remains invested in continuing with any and all available interventions offered. Full code, full scope care to continue. We discussed about possible need for SNF rehab with palliative following over there versus d/c home with home PT and the differences between the two. They are thankful for the successful ERCP procedure and stent placement and patient now feeling better, with less pain, with improvement in her labs.   Offered active listening and supportive presence, endorsed patient and family's wishes to continue with current mode of care. Reviewed  options for pain and non pain symptom management. PMT will sign off, please call us if needed.   40 minutes spent.  Greater than 50% of the time was spent in counseling and coordinating care related to the above assessment and plan.  Alisha Chance MD [facility] [phone]

## 2019-07-12 NOTE — Progress Notes (Signed)
This encounter was created in error - please disregard.

## 2019-07-12 NOTE — Progress Notes (Signed)
[facility]  PROGRESS NOTE    RAMYAH PANKOWSKI  ZHG:[id number] DOB: 06-Sep-1945 DOA: 07/08/2019 PCP: Jolinda Croak, MD   Brief Narrative:   Hayes Rehfeldt Websteris a 74 y.o.femalewith medical history significant ofmetastatic high-grade neuroendocrine tumor of the pancreas with multiple lymphadenopathies right adrenal mass bulky mediastinal and right hilar adenopathy who also appears to have obstructive jaundice probably from her malignancy. Patient has been referred to GI and was scheduled for ERCP with possible stenting on June 14. She was seen today in the cancer center with worsening symptoms. She got IV fluids. Was able to tolerate some juice and milk as well as some oatmeal. Patient however is deteriorating. She was sent over to the hospital at this point for admission evaluation and possible ERCP. Dr. Rush Landmark and GI because of GI aware. She has had rapid worsening of her LFTs and also new leukocytosis. There is worried that she may be developing acute cholangitis. She is also scheduled for palliative radiation with Dr. Lisbeth Renshaw which will have started today. Prognosis is noted to be poor and family fully informed. We are admitting the patient with oncology to follow together [place]   07/10/19: She was unable to complete her ERCP d/t respiratory issues this AM. CXR shows possible superimposed opacity on chronic interstitial disease. If we were to call this HAP, she is already on zosyn. Check MRSA swap to see if we need to add vanc. Add guaifenesin and tesslon.  6/10: From a respiratory standpoint, she seems ok today. Wean O2 as able. MRSA swab was negative. Will hold off on adding vanc for now. She needs better BP control. Resume her metoprolol at her home dosing. She reported metoprolol XL 25mg  TID, but it's prescribed as 75mg  qday. Will change to 75mg  qday. To have ERCP this afternoon. Appreciate GI assistance.   6/11: Feeling better today. Tolerating increased metoprolol XL. Will need  additional 5 days of abx. Will switch to augmentin in the AM.    Assessment & Plan:   Principal Problem:   Obstructive jaundice Active Problems:   Atrial flutter (HCC)   Hyponatremia   Emphysema of lung (HCC)   Chronic systolic CHF (congestive heart failure) (HCC)   High grade neuroendocrine carcinoma (HCC)   Malnutrition of moderate degree  Metastatic pancreatic cancer with diffuse mets to the liver, right adrenal mass, right chest wall, lumbar spine with abdominal lymph adenopathy - Abdominal ultrasound 07/05/2019 dilated CBD concerning for obstruction due to mass-effect 6.1 cm right liver mass and multiple mass in the pancreas.  - Status post radiation 6/7 with plans for chemotherapy by Dr. Annamaria Boots. - She was admitted from Dr. Fritz Pickerel office with elevated LFTs and leukocytosis. - GI onboard for ERCP - 6/9: unable to perform ERCP d/y respiratory distress; CXR obtained, possible PNA, currently on zosyn, check MRSA swab  - 6/10: respiratory status is improved; wean O2. MRSA swab negative. Continue zosyn  - 6/11: Looking a little better today. Advance diet and see if she tolerates it. Transition to augmentin in the AM. Likely discharge then.   Constipation  - continue senna  Normocytic anemia  - hemoglobin 10.3 on admit - No evidence of active bleeding. - monitor  History of essential hypertension - soft pressures at admission - home meds Avapro, Catapres, metoprolol, Aldactone. - Avapro, Catapres and Aldactone were stopped; metoprolol was continued - 6/9: BP is up today, resume avapro  - 6/10: increase metoprolol to home dosing (she's prescribed metoprolol xl 75mg  qday but takes it as 25mg   TID).   - 6/11: BP is better and HR is improved on metoprolol XL 75mg   History of hyperlipidemia  - not on statins; follow  History of chronic systolic CHF with ejection fraction 20 to 25%.  - Euvolemic. - 6/9: d/c IVF as she is hypertensive  hyponatremia - sodium  127 on admission up to 129 with normal saline. - 6/9: Na+ is 130. Hold fluids. Monitor.  - 6/11: Na+ is 132. Continue to encourage diet.    Sinus tachycardia multifactorial  - continue beta-blocker and continue pain control  - 6/10: increase metoprolol to home dose  - 6/11: improving, continue metoprolol  Tobacco use  - continue nicotine patch  Decreased appetite - continue Remeron   DVT prophylaxis: lovenox Code Status: FULL Family Communication: spoke with dtr at bedside   Status is: Inpatient  Remains inpatient appropriate because:IV treatments appropriate due to intensity of illness or inability to take PO   Dispo: The patient is from: Home              Anticipated d/c is to: Home              Anticipated d/c date is: 1 day              Patient currently is not medically stable to d/c.  Consultants:   GI  Oncology  Procedures:   ERCP  Antimicrobials:  . Zosyn   ROS:  Denies N, V, ab pain . Remainder 10-pt ROS is negative for all not previously mentioned.  Subjective: "That's good news."  Objective: Vitals:   07/11/19 1650 07/11/19 1704 07/11/19 2027 07/12/19 0531  BP: (!) 137/51 (!) 158/87 (!) 159/88 133/82  Pulse: (!) 114 (!) 120 (!) 106 (!) 106  Resp: (!) 21 20 19    Temp:  99.2 F (37.3 C) 97.8 F (36.6 C) 97.9 F (36.6 C)  TempSrc:  Oral Oral Oral  SpO2: 94% 94% 98% 94%  Weight:      Height:        Intake/Output Summary (Last 24 hours) at 07/12/2019 8250 Last data filed at 07/11/2019 1850 Gross per 24 hour  Intake 1220 ml  Output --  Net 1220 ml   Filed Weights   07/08/19 1755  Weight: 53.3 kg    Examination:  General: 74 y.o. female resting in bed in NAD Cardiovascular: tachy, +S1, S2, no m/g/r, equal pulses throughout Respiratory: CTABL, no w/r/r, normal WOB GI: BS+, NDNT, no masses noted, no organomegaly noted MSK: No e/c/c Neuro: A&O x 3, no focal deficits Psyc: Appropriate interaction and affect,  calm/cooperative   Data Reviewed: I have personally reviewed following labs and imaging studies.  CBC: Recent Labs  Lab 07/08/19 1219 07/09/19 0734 07/10/19 0557 07/11/19 0608 07/12/19 0554  WBC 15.9* 13.1* 10.3 10.5 9.7  NEUTROABS 12.8*  --  8.8* 9.3* 8.6*  HGB 10.3* 9.7* 9.2* 9.4* 9.3*  HCT 30.6* 29.0* 27.5* 28.2* 27.3*  MCV 81.4 82.4 81.4 81.0 80.3  PLT 548* 519* 521* 591* 539*   Basic Metabolic Panel: Recent Labs  Lab 07/08/19 1219 07/09/19 0734 07/10/19 0557 07/11/19 0608 07/12/19 0554  NA 127* 129* 130* 131* 132*  K 4.6 4.2 4.1 4.1 3.9  CL 98 99 101 100 100  CO2 19* 17* 17* 18* 19*  GLUCOSE 120* 107* 107* 101* 137*  BUN 14 19 11 11 22   CREATININE 0.89 0.75 0.47 0.35* 0.63  CALCIUM 9.6 8.5* 8.4* 9.0 8.7*  MG  --   --   --  2.3 2.4   GFR: Estimated Creatinine Clearance: 51.9 mL/min (by C-G formula based on SCr of 0.63 mg/dL). Liver Function Tests: Recent Labs  Lab 07/08/19 1219 07/09/19 0734 07/10/19 0557 07/11/19 0608 07/12/19 0554  AST 363* 191* 155* 130* 92*  ALT 706* 457* 311* 252* 198*  ALKPHOS 541* 389* 326* 326* 295*  BILITOT 10.9* 11.6* 11.9* 14.7* 8.7*  PROT 8.1 7.6 7.2 7.7 7.6  ALBUMIN 2.6* 2.9* 2.6* 2.6* 2.6*   No results for input(s): LIPASE, AMYLASE in the last 168 hours. No results for input(s): AMMONIA in the last 168 hours. Coagulation Profile: Recent Labs  Lab 07/10/19 0557  INR 1.2   Cardiac Enzymes: No results for input(s): CKTOTAL, CKMB, CKMBINDEX, TROPONINI in the last 168 hours. BNP (last 3 results) No results for input(s): PROBNP in the last 8760 hours. HbA1C: No results for input(s): HGBA1C in the last 72 hours. CBG: Recent Labs  Lab 07/10/19 1159  GLUCAP 96   Lipid Profile: No results for input(s): CHOL, HDL, LDLCALC, TRIG, CHOLHDL, LDLDIRECT in the last 72 hours. Thyroid Function Tests: No results for input(s): TSH, T4TOTAL, FREET4, T3FREE, THYROIDAB in the last 72 hours. Anemia Panel: No results for  input(s): VITAMINB12, FOLATE, FERRITIN, TIBC, IRON, RETICCTPCT in the last 72 hours. Sepsis Labs: No results for input(s): PROCALCITON, LATICACIDVEN in the last 168 hours.  Recent Results (from the past 240 hour(s))  SARS Coronavirus 2 by RT PCR (hospital order, performed in [facility] hospital lab) Nasopharyngeal Nasopharyngeal Swab     Status: None   Collection Time: 07/09/19 12:30 PM   Specimen: Nasopharyngeal Swab  Result Value Ref Range Status   SARS Coronavirus 2 NEGATIVE NEGATIVE Final    Comment: (NOTE) SARS-CoV-2 target nucleic acids are NOT DETECTED. The SARS-CoV-2 RNA is generally detectable in upper and lower respiratory specimens during the acute phase of infection. The lowest concentration of SARS-CoV-2 viral copies this assay can detect is 250 copies / mL. A negative result does not preclude SARS-CoV-2 infection and should not be used as the sole basis for treatment or other patient management decisions.  A negative result may occur with improper specimen collection / handling, submission of specimen other than nasopharyngeal swab, presence of viral mutation(s) within the areas targeted by this assay, and inadequate number of viral copies (<250 copies / mL). A negative result must be combined with clinical observations, patient history, and epidemiological information. Fact Sheet for Patients:   StrictlyIdeas.no Fact Sheet for Healthcare Providers: BankingDealers.co.za This test is not yet approved or cleared  by the [place] FDA and has been authorized for detection and/or diagnosis of SARS-CoV-2 by FDA under an Emergency Use Authorization (EUA).  This EUA will remain in effect (meaning this test can be used) for the duration of the COVID-19 declaration under Section 564(b)(1) of the Act, 21 U.S.C. section 360bbb-3(b)(1), unless the authorization is terminated or revoked sooner. Performed at [facility], [place] [address], [place], [place] [address]   MRSA PCR Screening     Status: None   Collection Time: 07/10/19  6:03 PM   Specimen: Nasal Mucosa; Nasopharyngeal  Result Value Ref Range Status   MRSA by PCR NEGATIVE NEGATIVE Final    Comment:        The GeneXpert MRSA Assay (FDA approved for NASAL specimens only), is one component of a comprehensive MRSA colonization surveillance program. It is not intended to diagnose MRSA infection nor to guide or monitor treatment for MRSA infections. Performed at [facility]  [place] [address], [place], [place] [address]       Radiology Studies: DG CHEST PORT 1 VIEW  Result Date: 07/10/2019 CLINICAL DATA:  Cough and dyspnea. EXAM: PORTABLE CHEST 1 VIEW COMPARISON:  Chest plain film and chest CT, dated Jun 28, 2019, are available for comparison. FINDINGS: The left lung is hyperinflated. Mild right-sided volume loss is seen. Mild diffuse chronic appearing increased lung markings are seen. Mild, predominant stable patchy opacities are seen overlying the mid right lung and right lung base. There is no evidence of a pleural effusion or pneumothorax. The cardiac silhouette is within normal limits. There is prominence of the right hilum. A 1.4 cm x 0.9 cm focal calcification is also seen within this region. The visualized skeletal structures are unremarkable. IMPRESSION: 1. Mild, predominant stable patchy opacities overlying the mid right lung and right lung base. While this may represent areas of chronic interstitial lung disease, a superimposed acute infiltrate cannot be excluded. 2. Prominent right hilum which corresponds to the right hilar adenopathy seen on the prior chest CT dated Jun 28, 2019. Electronically Signed   By: Virgina Norfolk M.D.   On: 07/10/2019 15:36   DG ERCP BILIARY & PANCREATIC DUCTS  Result Date: 07/11/2019 CLINICAL DATA:  ERCP. EXAM: ERCP TECHNIQUE: Multiple spot images obtained with the fluoroscopic  device and submitted for interpretation post-procedure. FLUOROSCOPY TIME:  Fluoroscopy Time:  3 minutes and 36 seconds Number of Acquired Spot Images: 20 COMPARISON:  CT dated May 28 21 FINDINGS: There was cannulation of the common bile duct. There is significant narrowing of the mid and distal common bile duct with intrahepatic biliary ductal dilatation. Final images demonstrate placement of a plastic biliary stent. IMPRESSION: Status post ERCP as above. These images were submitted for radiologic interpretation only. Please see the procedural report for the amount of contrast and the fluoroscopy time utilized. Electronically Signed   By: Constance Holster M.D.   On: 07/11/2019 19:13     Scheduled Meds: . enoxaparin (LOVENOX) injection  40 mg Subcutaneous Q24H  . feeding supplement  1 Container Oral TID BM  . feeding supplement (ENSURE ENLIVE)  237 mL Oral BID BM  . guaiFENesin  600 mg Oral BID  . irbesartan  150 mg Oral Daily  . metoprolol succinate  75 mg Oral Daily  . mirtazapine  7.5 mg Oral QHS  . nicotine  21 mg Transdermal Daily  . oxyCODONE  15 mg Oral Q12H  . senna-docusate  1 tablet Oral QHS  . senna-docusate  2 tablet Oral Daily   Continuous Infusions: . piperacillin-tazobactam (ZOSYN)  IV 3.375 g (07/12/19 0126)     LOS: 4 days    Time spent: 25 minutes spent in the coordination of care today.    Jonnie Finner, DO [facility]  If 7PM-7AM, please contact night-coverage [email or web address] 07/12/2019, 7:28 AM

## 2019-07-13 DIAGNOSIS — I4892 Unspecified atrial flutter: Secondary | ICD-10-CM

## 2019-07-13 LAB — CBC WITH DIFFERENTIAL/PLATELET
Abs Immature Granulocytes: 0.08 10*3/uL — ABNORMAL HIGH (ref 0.00–0.07)
Basophils Absolute: 0 10*3/uL (ref 0.0–0.1)
Basophils Relative: 0 %
Eosinophils Absolute: 0 10*3/uL (ref 0.0–0.5)
Eosinophils Relative: 1 %
HCT: 26.1 % — ABNORMAL LOW (ref 36.0–46.0)
Hemoglobin: 8.6 g/dL — ABNORMAL LOW (ref 12.0–15.0)
Immature Granulocytes: 1 %
Lymphocytes Relative: 8 %
Lymphs Abs: 0.4 10*3/uL — ABNORMAL LOW (ref 0.7–4.0)
MCH: 27.1 pg (ref 26.0–34.0)
MCHC: 33 g/dL (ref 30.0–36.0)
MCV: 82.3 fL (ref 80.0–100.0)
Monocytes Absolute: 0.5 10*3/uL (ref 0.1–1.0)
Monocytes Relative: 10 %
Neutro Abs: 4.4 10*3/uL (ref 1.7–7.7)
Neutrophils Relative %: 80 %
Platelets: 596 10*3/uL — ABNORMAL HIGH (ref 150–400)
RBC: 3.17 MIL/uL — ABNORMAL LOW (ref 3.87–5.11)
RDW: 18.7 % — ABNORMAL HIGH (ref 11.5–15.5)
WBC: 5.5 10*3/uL (ref 4.0–10.5)
nRBC: 0 % (ref 0.0–0.2)

## 2019-07-13 LAB — COMPREHENSIVE METABOLIC PANEL
ALT: 149 U/L — ABNORMAL HIGH (ref 0–44)
AST: 74 U/L — ABNORMAL HIGH (ref 15–41)
Albumin: 2.4 g/dL — ABNORMAL LOW (ref 3.5–5.0)
Alkaline Phosphatase: 249 U/L — ABNORMAL HIGH (ref 38–126)
Anion gap: 11 (ref 5–15)
BUN: 18 mg/dL (ref 8–23)
CO2: 21 mmol/L — ABNORMAL LOW (ref 22–32)
Calcium: 8.5 mg/dL — ABNORMAL LOW (ref 8.9–10.3)
Chloride: 102 mmol/L (ref 98–111)
Creatinine, Ser: 0.51 mg/dL (ref 0.44–1.00)
GFR calc Af Amer: >60 mL/min (ref >60)
GFR calc non Af Amer: >60 mL/min (ref >60)
Glucose, Bld: 80 mg/dL (ref 70–99)
Potassium: 3.7 mmol/L (ref 3.5–5.1)
Sodium: 134 mmol/L — ABNORMAL LOW (ref 135–145)
Total Bilirubin: 6 mg/dL — ABNORMAL HIGH (ref 0.3–1.2)
Total Protein: 6.8 g/dL (ref 6.5–8.1)

## 2019-07-13 LAB — MAGNESIUM: Magnesium: 2.2 mg/dL (ref 1.7–2.4)

## 2019-07-13 MED ORDER — AMOXICILLIN-POT CLAVULANATE 875-125 MG PO TABS: 1.0000 | ORAL_TABLET | Freq: Two times a day (BID) | ORAL | 0 refills | Status: AC

## 2019-07-13 MED ORDER — METOPROLOL SUCCINATE ER 25 MG PO TB24
75.0000 mg | ORAL_TABLET | Freq: Every day | ORAL | 1 refills | Status: AC
Start: 2019-07-13 — End: 2019-08-12

## 2019-07-13 MED ORDER — GUAIFENESIN ER 600 MG PO TB12: 600.0000 mg | ORAL_TABLET | Freq: Two times a day (BID) | ORAL | 0 refills | Status: AC

## 2019-07-13 MED ORDER — AMOXICILLIN-POT CLAVULANATE 875-125 MG PO TABS
1.0000 | ORAL_TABLET | Freq: Two times a day (BID) | ORAL | Status: DC
  Administered 2019-07-13: 1 via ORAL
  Filled 2019-07-13: qty 1

## 2019-07-13 MED ORDER — BENZONATATE 100 MG PO CAPS: 100.0000 mg | ORAL_CAPSULE | Freq: Three times a day (TID) | ORAL | 0 refills | Status: DC | PRN

## 2019-07-13 NOTE — Evaluation (Signed)
Physical Therapy Evaluation Patient Details Name: Alisha Goodman MRN: [id number] DOB: 1945/12/11 Today's Date: 07/13/2019   History of Present Illness  74 y.o. female with neuroendocrine tumor of the pancreas with multiple lymphadenopathies (right adrenal mass, mediastinal and right hilar adenopathy) who also appears to have obstructive jaundice probably from her malignancy, s/p ERCP with common bile duct stenting on 07/12/19. PMH includes atherosclerosis, a flutter, HF, HLD, HTN, smoker.  Clinical Impression   Pt presents with generalized weakness, L thigh pain, impaired standing balance dynamically requiring use of RW today, and decreased activity tolerance. Pt to benefit from acute PT to address deficits. Pt ambulated 48 ft with RW and min guard for safety, pt and family instructed to provide contact guard vs supervision level of assist during mobility at home given pt weakness and high fall risk at this time. PT discussed the importance of energy conservation, asking for assistance from family as needed, and rest breaks at home, as typically pt likes to be independent and push mobility. PT also encouraged pt and husband to get a rollator upon d/c for energy conservation and rest break purposes (just got a RW during last admission). PT recommending HHPT to address mobility deficits and decrease fall risk. PT to progress mobility as tolerated, and will continue to follow acutely.      Follow Up Recommendations Home health PT;Supervision for mobility/OOB    Equipment Recommendations  None recommended by PT    Recommendations for Other Services       Precautions / Restrictions Precautions Precautions: Fall Restrictions Weight Bearing Restrictions: No      Mobility  Bed Mobility Overal bed mobility: Needs Assistance Bed Mobility: Supine to Sit;Sit to Supine     Supine to sit: Supervision;HOB elevated Sit to supine: Supervision;HOB elevated   General bed mobility comments: for  safety, increased time and effort with use of HOB elevation and bedrails .  Transfers Overall transfer level: Needs assistance Equipment used: Rolling walker (2 wheeled) Transfers: Sit to/from Stand Sit to Stand: Supervision         General transfer comment: for safety, slow and steady rise.  Ambulation/Gait Ambulation/Gait assistance: Min guard Gait Distance (Feet): 45 Feet Assistive device: Rolling walker (2 wheeled) Gait Pattern/deviations: Step-through pattern;Decreased stride length;Trunk flexed Gait velocity: decr   General Gait Details: Min guard for safety, standing rest break x1 to recover increased work of breathing and fatigue. No physical assist required for mobility  Stairs            Wheelchair Mobility    Modified Rankin (Stroke Patients Only)       Balance Overall balance assessment: Needs assistance Sitting-balance support: No upper extremity supported;Feet supported Sitting balance-Leahy Scale: Good     Standing balance support: No upper extremity supported Standing balance-Leahy Scale: Fair Standing balance comment: able to stand without device statically, requires use of RW for ambulation                             Pertinent Vitals/Pain Pain Assessment: Faces Faces Pain Scale: Hurts little more Pain Location: L thigh Pain Descriptors / Indicators: Grimacing;Guarding Pain Intervention(s): Monitored during session;Repositioned;Limited activity within patient's tolerance    Home Living Family/patient expects to be discharged to:: Private residence Living Arrangements: Spouse/significant other Available Help at Discharge: Family;Available 24 hours/day Type of Home: House Home Access: Stairs to enter   [employer] of Steps: 2 at back with no rail which is normal entrance,  but 3 at front with rail Home Layout: Two level;Able to live on main level with bedroom/bathroom Home Equipment: Gilford Rile - 2 wheels;Shower  seat;Bedside commode      Prior Function Level of Independence: Needs assistance   Gait / Transfers Assistance Needed: pt ambulates without AD typically, has a RW that she uses if needed. Family is supervision assist during mobility  ADL's / Homemaking Assistance Needed: Pt no longer cooks, occasionally requires assist for ADLs.        Hand Dominance   Dominant Hand: Right    Extremity/Trunk Assessment        Lower Extremity Assessment Lower Extremity Assessment: Generalized weakness (Gross functional movements of SLR and heel slide - increased time and effort, mild quad lag) LLE: Unable to fully assess due to pain    Cervical / Trunk Assessment Cervical / Trunk Assessment: Normal  Communication   Communication: No difficulties  Cognition Arousal/Alertness: Awake/alert Behavior During Therapy: WFL for tasks assessed/performed Overall Cognitive Status: Within Functional Limits for tasks assessed                                        General Comments      Exercises     Assessment/Plan    PT Assessment Patient needs continued PT services  PT Problem List Decreased activity tolerance;Decreased balance;Decreased strength;Decreased mobility;Pain;Cardiopulmonary status limiting activity;Decreased knowledge of use of DME       PT Treatment Interventions DME instruction;Gait training;Stair training;Functional mobility training;Neuromuscular re-education;Balance training;Therapeutic exercise;Therapeutic activities;Patient/family education    PT Goals (Current goals can be found in the Care Plan section)  Acute Rehab PT Goals Patient Stated Goal: go home PT Goal Formulation: With patient/family Time For Goal Achievement: 07/27/19 Potential to Achieve Goals: Good    Frequency Min 3X/week   Barriers to discharge        Co-evaluation               AM-PAC PT "6 Clicks" Mobility  Outcome Measure Help needed turning from your back to your side  while in a flat bed without using bedrails?: A Little Help needed moving from lying on your back to sitting on the side of a flat bed without using bedrails?: A Little Help needed moving to and from a bed to a chair (including a wheelchair)?: A Little Help needed standing up from a chair using your arms (e.g., wheelchair or bedside chair)?: A Little Help needed to walk in hospital room?: A Little Help needed climbing 3-5 steps with a railing? : A Lot 6 Click Score: 17    End of Session Equipment Utilized During Treatment: Gait belt Activity Tolerance: Patient tolerated treatment well Patient left: in bed;with call bell/phone within reach;with family/visitor present Nurse Communication: Mobility status PT Visit Diagnosis: Difficulty in walking, not elsewhere classified (R26.2);Pain;Muscle weakness (generalized) (M62.81) Pain - Right/Left: Left Pain - part of body: Leg    Time: 1342-1406 PT Time Calculation (min) (ACUTE ONLY): 24 min   Charges:   PT Evaluation $PT Eval Low Complexity: 1 Low PT Treatments $Gait Training: 8-22 mins      Damyiah Moxley E, PT Acute Rehabilitation Services Pager [phone]  Office [phone]   Zebedee Segundo D Elonda Husky 07/13/2019, 3:02 PM

## 2019-07-13 NOTE — Discharge Summary (Signed)
Physician Discharge Summary  Alisha Goodman DXI:[id number] DOB: August 29, 1945 DOA: 07/08/2019  PCP: Alisha Croak, MD  Admit date: 07/08/2019 Discharge date: 07/13/2019  Admitted From: Home Disposition:  Discharge to home.   Recommendations for Outpatient Follow-up:  1. Follow up with PCP in 1 week. 2. Follow up with oncology as scheduled.  Discharge Condition: Stable  CODE STATUS: FULL   Brief/Interim Summary: Alisha Simmonds Websteris a 74 y.o.femalewith medical history significant ofmetastatic high-grade neuroendocrine tumor of the pancreas with multiple lymphadenopathies right adrenal mass bulky mediastinal and right hilar adenopathy who also appears to have obstructive jaundice probably from her malignancy. Patient has been referred to GI and was scheduled for ERCP with possible stenting on June 14. She was seen today in the cancer center with worsening symptoms. She got IV fluids. Was able to tolerate some juice and milk as well as some oatmeal. Patient however is deteriorating. She was sent over to the hospital at this point for admission evaluation and possible ERCP. Dr. Rush Goodman and GI because of GI aware. She has had rapid worsening of her LFTs and also new leukocytosis. There is worried that she may be developing acute cholangitis. She is also scheduled for palliative radiation with Dr. Lisbeth Goodman which will have started today. Prognosis is noted to be poor and family fully informed. We are admitting the patient with oncology to follow together Alisha Goodman   07/10/19: She was unable to complete her ERCP d/t respiratory issues this AM. CXR shows possible superimposed opacity on chronic interstitial disease. If we were to call this HAP, she is already on zosyn. Check MRSA swap to see if we need to add vanc. Add guaifenesin and tesslon.  6/10: From a respiratory standpoint, she seems ok today. Wean O2 as able. MRSA swab was negative. Will hold off on adding vanc for now. She needs better  BP control. Resume her metoprolol at her home dosing. She reported metoprolol XL 32m TID, but it's prescribed as 766mqday. Will change to 7577mday. To have ERCP this afternoon. Appreciate GI assistance.  6/11: Feeling better today. Tolerating increased metoprolol XL. Will need additional 5 days of abx. Will switch to augmentin in the AM.  6/12: Ok to discharge to home. Follow up with PCP in 1 week. Follow up with oncology as scheduled. Follow up with GI as needed. Abx through 6/15.  Discharge Diagnoses:  Principal Problem:   Obstructive jaundice Active Problems:   Atrial flutter (HCC)   Hyponatremia   Emphysema of lung (HCC)   Chronic systolic CHF (congestive heart failure) (HCC)   High grade neuroendocrine carcinoma (HCC)   Malnutrition of moderate degree   Palliative care by specialist   Goals of care, counseling/discussion   General weakness  Metastatic pancreatic cancer with diffuse mets to the liver, right adrenal mass, right chest wall, lumbar spine with abdominal lymph adenopathy - Abdominal ultrasound 07/05/2019 dilated CBD concerning for obstruction due to mass-effect 6.1 cm right liver mass and multiple mass in the pancreas.  - Status post radiation 6/7 with plans for chemotherapy by Alisha Goodman She was admitted from Dr. FanFritz Pickerelfice with elevated LFTs and leukocytosis. - GI onboard for ERCP - 6/9: unable to perform ERCP d/y respiratory distress; CXR obtained, possible PNA, currently on zosyn, check MRSA swab - 6/10: respiratory status is improved; wean O2. MRSA swab negative. Continue zosyn  - 6/11: Looking a little better today. Advance diet and see if she tolerates it. Transition to augmentin in the AM. Likely  discharge then.  - 6/12: Ok to d/c to home. Abx through 6/15. Follow up with PCP in 1 week. Follow up with oncology as scheduled.   Constipation  - continue senna  Normocytic anemia  - hemoglobin 10.3 on admit - No evidence of active bleeding. -  monitor  History of essential hypertension - soft pressures at admission - home meds Avapro, Catapres, metoprolol, Aldactone. - Avapro, Catapres and Aldactone were stopped; metoprolol was continued - 6/9: BP is up today, resume avapro - 6/10: increase metoprolol to home dosing (she's prescribed metoprolol xl 22m qday but takes it as 268mTID).  - 6/11: BP is better and HR is improved on metoprolol XL 7540m- 6/12: resume home meds at discharge.  History of hyperlipidemia  - not on statins; follow  History of chronic systolic CHF with ejection fraction 20 to 25%.  - Euvolemic. - 6/9: d/c IVF as she is hypertensive  hyponatremia - sodium 127 on admission up to 129 with normal saline. - 6/9: Na+ is 130. Hold fluids. Monitor. - 6/11: Na+ is 132. Continue to encourage diet.  - 6/12: Na+ is up to 134 today.    Sinus tachycardia multifactorial  - continue beta-blocker and continue pain control - 6/10: increase metoprolol to home dose  - 6/11: improving, continue metoprolol  - 6/12: resolved.  Tobacco use  - continue nicotine patch  Decreased appetite - continue Remeron  Discharge Instructions   Allergies as of 07/13/2019      Reactions   Sulfa Antibiotics    Rash     Aspirin Nausea Only      Medication List    TAKE these medications   ALPRAZolam 0.25 MG tablet Commonly known as: XANAX Take 1 tablet (0.25 mg total) by mouth at bedtime as needed for anxiety.   amoxicillin-clavulanate 875-125 MG tablet Commonly known as: AUGMENTIN Take 1 tablet by mouth every 12 (twelve) hours for 7 doses.   benzonatate 100 MG capsule Commonly known as: TESSALON Take 1 capsule (100 mg total) by mouth 3 (three) times daily as needed for up to 5 days for cough.   cloNIDine 0.2 MG tablet Commonly known as: CATAPRES Take 1 tablet (0.2 mg total) by mouth 2 (two) times daily.   guaiFENesin 600 MG 12 hr tablet Commonly known as: MUCINEX Take 1 tablet (600 mg  total) by mouth 2 (two) times daily for 5 days.   irbesartan 150 MG tablet Commonly known as: AVAPRO Take 1 tablet (150 mg total) by mouth daily.   metoprolol succinate 25 MG 24 hr tablet Commonly known as: Toprol XL Take 3 tablets (75 mg total) by mouth daily. What changed:   how much to take  when to take this   mirtazapine 7.5 MG tablet Commonly known as: REMERON Take 1 tablet (7.5 mg total) by mouth at bedtime.   nicotine 21 mg/24hr patch Commonly known as: NICODERM CQ - dosed in mg/24 hours Place 1 patch (21 mg total) onto the skin daily.   oxyCODONE 15 mg 12 hr tablet Commonly known as: OXYCONTIN Take 1 tablet (15 mg total) by mouth every 12 (twelve) hours.   oxyCODONE 5 MG immediate release tablet Commonly known as: Oxy IR/ROXICODONE Take 1 tablet (5 mg total) by mouth every 6 (six) hours as needed for breakthrough pain.   senna 8.6 MG Tabs tablet Commonly known as: SENOKOT Take 1 tablet (8.6 mg total) by mouth in the morning and at bedtime.   spironolactone 25 MG tablet  Commonly known as: ALDACTONE Take 0.5 tablets (12.5 mg total) by mouth daily.       Allergies  Allergen Reactions  . Sulfa Antibiotics     Rash    . Aspirin Nausea Only    Consultations:  Oncology  GI  Procedures/Studies: DG Chest 2 View  Result Date: 06/28/2019 CLINICAL DATA:  Shortness of breath, weakness EXAM: CHEST - 2 VIEW COMPARISON:  Radiograph 06/22/2004 FINDINGS: Irregular dense opacity is seen along the right mediastinal border and projecting over the hilum with a possible hilum overlay sign suggesting either anterior or posterior mediastinal origins. However, suspect that this process may be involving the right central airways given more peripheral wedge like opacity throughout the right lung which could reflect a postobstructive infectious or inflammatory process. Central pulmonary arteries are enlarged. Cardiac size within normal limits. No pneumothorax or visible  effusion. No acute osseous or soft tissue abnormality. Telemetry leads overlie the chest. IMPRESSION: Irregular dense opacity along the right mediastinal border and projecting over the hilum with possible involvement of the airways given more peripheral opacity which could reflect a postobstructive process. Further evaluation with cross-sectional imaging is warranted. Central pulmonary arterial enlargement noted as well. May reflect pulmonary artery hypertension. Electronically Signed   By: Lovena Le M.D.   On: 06/28/2019 01:37   CT Chest W Contrast  Result Date: 06/28/2019 CLINICAL DATA:  Mediastinal mass on x-ray. Elevated LFTs. Left back pain. EXAM: CT CHEST, ABDOMEN, AND PELVIS WITH CONTRAST TECHNIQUE: Multidetector CT imaging of the chest, abdomen and pelvis was performed following the standard protocol during bolus administration of intravenous contrast. CONTRAST:  168m OMNIPAQUE IOHEXOL 300 MG/ML  SOLN COMPARISON:  Chest radiograph earlier this day. Remote abdominal CT 1126 FINDINGS: CT CHEST FINDINGS Cardiovascular: Aortic atherosclerosis. Aortic tortuosity without aneurysm. Heart is normal in size. Coronary artery calcifications. No pericardial effusion. Extensive mediastinal adenopathy causes mass effect and narrowing of the right pulmonary arteries. No obvious intraluminal filling defect. Mediastinum/Nodes: Extensive mediastinal and right hilar adenopathy, with confluent nodal conglomerate extending from the subcarinal station to the level of the mid trachea. Lymph nodes causes mass effect and narrowing of the right upper lobe bronchus bronchus intermedius and both lower lobe bronchi. Representative measurement at the level of the carina of 6.8 x 5.3 cm, series 1, image 23. Subcarinal nodal conglomerate measures 3.4 x 5.2 cm, series 1, image 30. Prevascular nodal mass measuring 5.4 x 2.3 cm, series 1, image 25. Left supraclavicular adenopathy measuring 2 x 2 cm, series 1, image 5. Additional  adenopathy at multiple stations. Many of these lymph nodes are partially calcified and low-density/necrotic. There is no left hilar adenopathy. There is no axillary adenopathy. Slight mass effect on the esophagus from nodal conglomerate, which is difficult to delineate in the mid distal portion. No visualized thyroid nodule. Lungs/Pleura: There are multiple right pulmonary nodules. It is difficult to delineate nodules from adenopathy in the central right lung. Adenopathy is irregular borders. Multiple branching nodules in the right upper lobe which may represent lymphangitic spread, for example series 3, image 54. There is a 10 mm medial right lower lobe pulmonary nodule, series 3, image 73. 9 mm right lower lobe nodule series 3, image 87. There is pleural based nodularity in the posterior right upper lobe that is contiguous with adenopathy. Adenopathy causes mass effect and narrowing of the right upper lobe bronchus, bronchus intermedius and right middle and lower lobe bronchi. No significant pleural effusion. No discrete left lung pulmonary nodules.  Mild emphysema.  Musculoskeletal: No blastic or destructive lytic lesions. Right chest wall metastasis, series 1, image 35. CT ABDOMEN PELVIS FINDINGS Hepatobiliary: Large irregular hypodense liver lesion in the inferior liver tip measures 6.1 x 4.3 cm. There is right greater than left intrahepatic biliary ductal dilatation, likely due to mass effect from periportal adenopathy on the common bile duct. Gallbladder is not well visualized. Pancreas: Heterogeneous mass in the pancreatic body measuring 4.3 x 2.5 cm. There is no distal atrophy. Mild proximal ductal dilatation of 5 mm. Spleen: Calcified granuloma.  Splenule inferiorly. Adrenals/Urinary Tract: Nodular thickening of the left adrenal gland up to 11 mm. Heterogeneous right adrenal mass measures 3.3 x 2.6 cm, increased in size from prior exam. There is prominent right renal parenchymal thinning with calyceal  dilatation of the upper and lower poles. Large intra caliceal stones in the lower right kidney. There is no significant perinephric edema. Urinary bladder is distended but unremarkable. Stomach/Bowel: No obvious gastric mass. No small bowel obstruction or inflammatory change. High-density material throughout the colon may be contrast or bismuth containing products. Moderate stool proximally. No obvious colonic mass. Appendix is normal. Vascular/Lymphatic: Multifocal abdominal adenopathy. Periportal adenopathy with 3 x 2.3 cm lymph node, series 1, image 66. There are additional enlarged periportal nodes. Multiple enlarged upper abdominal nodes in the retroperitoneum and peripancreatic space. Representative right retroperitoneal node measures 3.0 x 1.8 cm, series 1, image 65. There is an enlarged central mesenteric node at 9 mm, series 1, image 75. Right retroperitoneal adenopathy causes mild mass effect on the IVC. There is a retrocrural node adjacent to the distal aorta. No pelvic adenopathy. Aortic and branch atherosclerosis. The portal vein is attenuated with mass effect from adjacent adenopathy. Reproductive: The uterus is not seen.  No obvious adnexal mass. Other: No ascites or omental thickening.  No free air. Musculoskeletal: Left paraspinal met measures 3.1 x 2.2 cm, series 168 at the level of L3-L4. No evidence of blastic or destructive lytic lesion. Scoliotic curvature of the spine. IMPRESSION: 1. 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 Site of primary malignancy is not definitively determined, however but favored to represent right lung primary. There is also pancreatic lesion which may be an alternative primary. Tissue sampling is recommended. 2. Bulky mediastinal and right hilar adenopathy, nodal conglomerate causes narrowing of the right upper lobe bronchus, right bronchus intermedius, upper and lower lobe bronchi. Multiple right-sided pulmonary nodules, including pleural  based nodules posteriorly. 3. Large liver lesion suspicious for metastasis. There is biliary dilatation which is likely secondary to mass effect on the common bile duct from periportal adenopathy. 4. Multifocal abdominal adenopathy throughout the retroperitoneum, periportal region, central mesentery. 5. Pancreatic mass measuring 4.3 cm which may be metastasis or primary malignancy. 6. Right adrenal mass and left adrenal thickening. 7. Subcutaneous metastasis to the right chest wall. Left paraspinal intramuscular metastasis at the level of L3. 8. Non oncologic findings of right renal atrophy with large nonobstructing stones in the lower right kidney. Aortic Atherosclerosis (ICD10-I70.0) and Emphysema (ICD10-J43.9). Electronically Signed   By: Keith Rake M.D.   On: 06/28/2019 03:46   US Abdomen Complete  Result Date: 07/05/2019 CLINICAL DATA:  Hyperbilirubinemia. EXAM: ABDOMEN ULTRASOUND COMPLETE COMPARISON:  Jun 28, 2019. FINDINGS: Gallbladder: Not visualized. Common bile duct: Diameter: 8 mm which is mildly dilated concerning for possible distal common bile duct obstruction. Liver: Large solitary mass measuring 6.1 cm is noted in right hepatic lobe consistent with metastatic disease. Within normal limits in parenchymal echogenicity. Portal vein  is patent on color Doppler imaging with normal direction of blood flow towards the liver. IVC: No abnormality visualized. Pancreas: Multiple masses are noted within the pancreas as described on prior CT scan, the largest measuring 3.9 x 3.6 x 2.6 cm in the pancreatic tail. Spleen: Calcification is noted most consistent with calcified splenic granuloma. No other abnormality is noted. Right Kidney: Length: 11.1 cm. Increased echogenicity of renal parenchyma is noted. Calyceal dilatation is noted throughout the right kidney as described on prior CT scan. No significant pelvic dilatation is noted. 1.5 cm nonobstructive calculus is noted in lower pole. Left Kidney:  Length: 11.6 cm. Increased echogenicity is noted consistent with medical renal disease. No mass or hydronephrosis visualized. Abdominal aorta: No aneurysm visualized. Other findings: None. IMPRESSION: 6.1 cm solitary mass seen in right hepatic lobe consistent with metastatic disease. Multiple masses are seen involving the pancreas, with the largest measuring 3.9 cm in the pancreatic tail. Common bile duct is mildly dilated at 8 mm concerning for distal common bile duct obstruction. Potentially this may be due to previously described masses. Gallbladder is not visualized. Calyceal dilatation is again noted involving the right kidney, with probable nonobstructive calculus seen in lower pole collecting system. Electronically Signed   By: Marijo Conception M.D.   On: 07/05/2019 12:56   CT ABDOMEN PELVIS W CONTRAST  Result Date: 06/28/2019 CLINICAL DATA:  Mediastinal mass on x-ray. Elevated LFTs. Left back pain. EXAM: CT CHEST, ABDOMEN, AND PELVIS WITH CONTRAST TECHNIQUE: Multidetector CT imaging of the chest, abdomen and pelvis was performed following the standard protocol during bolus administration of intravenous contrast. CONTRAST:  117m OMNIPAQUE IOHEXOL 300 MG/ML  SOLN COMPARISON:  Chest radiograph earlier this day. Remote abdominal CT 1126 FINDINGS: CT CHEST FINDINGS Cardiovascular: Aortic atherosclerosis. Aortic tortuosity without aneurysm. Heart is normal in size. Coronary artery calcifications. No pericardial effusion. Extensive mediastinal adenopathy causes mass effect and narrowing of the right pulmonary arteries. No obvious intraluminal filling defect. Mediastinum/Nodes: Extensive mediastinal and right hilar adenopathy, with confluent nodal conglomerate extending from the subcarinal station to the level of the mid trachea. Lymph nodes causes mass effect and narrowing of the right upper lobe bronchus bronchus intermedius and both lower lobe bronchi. Representative measurement at the level of the carina of  6.8 x 5.3 cm, series 1, image 23. Subcarinal nodal conglomerate measures 3.4 x 5.2 cm, series 1, image 30. Prevascular nodal mass measuring 5.4 x 2.3 cm, series 1, image 25. Left supraclavicular adenopathy measuring 2 x 2 cm, series 1, image 5. Additional adenopathy at multiple stations. Many of these lymph nodes are partially calcified and low-density/necrotic. There is no left hilar adenopathy. There is no axillary adenopathy. Slight mass effect on the esophagus from nodal conglomerate, which is difficult to delineate in the mid distal portion. No visualized thyroid nodule. Lungs/Pleura: There are multiple right pulmonary nodules. It is difficult to delineate nodules from adenopathy in the central right lung. Adenopathy is irregular borders. Multiple branching nodules in the right upper lobe which may represent lymphangitic spread, for example series 3, image 54. There is a 10 mm medial right lower lobe pulmonary nodule, series 3, image 73. 9 mm right lower lobe nodule series 3, image 87. There is pleural based nodularity in the posterior right upper lobe that is contiguous with adenopathy. Adenopathy causes mass effect and narrowing of the right upper lobe bronchus, bronchus intermedius and right middle and lower lobe bronchi. No significant pleural effusion. No discrete left lung pulmonary nodules.  Mild  emphysema. Musculoskeletal: No blastic or destructive lytic lesions. Right chest wall metastasis, series 1, image 35. CT ABDOMEN PELVIS FINDINGS Hepatobiliary: Large irregular hypodense liver lesion in the inferior liver tip measures 6.1 x 4.3 cm. There is right greater than left intrahepatic biliary ductal dilatation, likely due to mass effect from periportal adenopathy on the common bile duct. Gallbladder is not well visualized. Pancreas: Heterogeneous mass in the pancreatic body measuring 4.3 x 2.5 cm. There is no distal atrophy. Mild proximal ductal dilatation of 5 mm. Spleen: Calcified granuloma.  Splenule  inferiorly. Adrenals/Urinary Tract: Nodular thickening of the left adrenal gland up to 11 mm. Heterogeneous right adrenal mass measures 3.3 x 2.6 cm, increased in size from prior exam. There is prominent right renal parenchymal thinning with calyceal dilatation of the upper and lower poles. Large intra caliceal stones in the lower right kidney. There is no significant perinephric edema. Urinary bladder is distended but unremarkable. Stomach/Bowel: No obvious gastric mass. No small bowel obstruction or inflammatory change. High-density material throughout the colon may be contrast or bismuth containing products. Moderate stool proximally. No obvious colonic mass. Appendix is normal. Vascular/Lymphatic: Multifocal abdominal adenopathy. Periportal adenopathy with 3 x 2.3 cm lymph node, series 1, image 66. There are additional enlarged periportal nodes. Multiple enlarged upper abdominal nodes in the retroperitoneum and peripancreatic space. Representative right retroperitoneal node measures 3.0 x 1.8 cm, series 1, image 65. There is an enlarged central mesenteric node at 9 mm, series 1, image 75. Right retroperitoneal adenopathy causes mild mass effect on the IVC. There is a retrocrural node adjacent to the distal aorta. No pelvic adenopathy. Aortic and branch atherosclerosis. The portal vein is attenuated with mass effect from adjacent adenopathy. Reproductive: The uterus is not seen.  No obvious adnexal mass. Other: No ascites or omental thickening.  No free air. Musculoskeletal: Left paraspinal met measures 3.1 x 2.2 cm, series 168 at the level of L3-L4. No evidence of blastic or destructive lytic lesion. Scoliotic curvature of the spine. IMPRESSION: 1. 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 Site of primary malignancy is not definitively determined, however but favored to represent right lung primary. There is also pancreatic lesion which may be an alternative primary. Tissue sampling is  recommended. 2. Bulky mediastinal and right hilar adenopathy, nodal conglomerate causes narrowing of the right upper lobe bronchus, right bronchus intermedius, upper and lower lobe bronchi. Multiple right-sided pulmonary nodules, including pleural based nodules posteriorly. 3. Large liver lesion suspicious for metastasis. There is biliary dilatation which is likely secondary to mass effect on the common bile duct from periportal adenopathy. 4. Multifocal abdominal adenopathy throughout the retroperitoneum, periportal region, central mesentery. 5. Pancreatic mass measuring 4.3 cm which may be metastasis or primary malignancy. 6. Right adrenal mass and left adrenal thickening. 7. Subcutaneous metastasis to the right chest wall. Left paraspinal intramuscular metastasis at the level of L3. 8. Non oncologic findings of right renal atrophy with large nonobstructing stones in the lower right kidney. Aortic Atherosclerosis (ICD10-I70.0) and Emphysema (ICD10-J43.9). Electronically Signed   By: Keith Rake M.D.   On: 06/28/2019 03:46   DG CHEST PORT 1 VIEW  Result Date: 07/10/2019 CLINICAL DATA:  Cough and dyspnea. EXAM: PORTABLE CHEST 1 VIEW COMPARISON:  Chest plain film and chest CT, dated Jun 28, 2019, are available for comparison. FINDINGS: The left lung is hyperinflated. Mild right-sided volume loss is seen. Mild diffuse chronic appearing increased lung markings are seen. Mild, predominant stable patchy opacities are seen overlying the  mid right lung and right lung base. There is no evidence of a pleural effusion or pneumothorax. The cardiac silhouette is within normal limits. There is prominence of the right hilum. A 1.4 cm x 0.9 cm focal calcification is also seen within this region. The visualized skeletal structures are unremarkable. IMPRESSION: 1. Mild, predominant stable patchy opacities overlying the mid right lung and right lung base. While this may represent areas of chronic interstitial lung disease, a  superimposed acute infiltrate cannot be excluded. 2. Prominent right hilum which corresponds to the right hilar adenopathy seen on the prior chest CT dated Jun 28, 2019. Electronically Signed   By: Virgina Norfolk M.D.   On: 07/10/2019 15:36   DG ERCP BILIARY & PANCREATIC DUCTS  Result Date: 07/11/2019 CLINICAL DATA:  ERCP. EXAM: ERCP TECHNIQUE: Multiple spot images obtained with the fluoroscopic device and submitted for interpretation post-procedure. FLUOROSCOPY TIME:  Fluoroscopy Time:  3 minutes and 36 seconds Number of Acquired Spot Images: 20 COMPARISON:  CT dated May 28 21 FINDINGS: There was cannulation of the common bile duct. There is significant narrowing of the mid and distal common bile duct with intrahepatic biliary ductal dilatation. Final images demonstrate placement of a plastic biliary stent. IMPRESSION: Status post ERCP as above. These images were submitted for radiologic interpretation only. Please see the procedural report for the amount of contrast and the fluoroscopy time utilized. Electronically Signed   By: Constance Holster M.D.   On: 07/11/2019 19:13   ECHOCARDIOGRAM COMPLETE  Result Date: 06/28/2019    ECHOCARDIOGRAM REPORT   Patient Name:   RICA HEATHER Date of Exam: 06/28/2019 Medical Rec #:  [id number]       Height:       65.0 in Accession #:    [id number]      Weight:       129.0 lb Date of Birth:  08/31/1949        BSA:          1.642 m Patient Age:    3 years        BP:           133/81 mmHg Patient Gender: F               HR:           115 bpm. Exam Location:  Inpatient Procedure: 2D Echo, Cardiac Doppler, Color Doppler and Intracardiac            Opacification Agent Indications:    Chest Pain 786.50 / R07.9  History:        Patient has no prior history of Echocardiogram examinations.                 Risk Factors:Former Smoker.  Sonographer:    Vickie Epley RDCS Referring Phys: [id number] [facility] A THOMAS  Sonographer Comments: Suboptimal parasternal window. IMPRESSIONS   1. No apical thrombus by Definity contrast. Left ventricular ejection fraction, by estimation, is 20 to 25%. The left ventricle has severely decreased function. The left ventricle demonstrates global hypokinesis. The left ventricular internal cavity size was mildly dilated. indeterminate diastolic function, due to what appears to be atrial flutter.  2. Right ventricular systolic function is hyperdynamic. The right ventricular size is normal.  3. The mitral valve is grossly normal. No evidence of mitral valve regurgitation.  4. The aortic valve was not well visualized. Aortic valve regurgitation is not visualized. Mild aortic valve sclerosis is present, with no evidence of aortic valve  stenosis.  5. The inferior vena cava is normal in size with greater than 50% respiratory variability, suggesting right atrial pressure of 3 mmHg. Conclusion(s)/Recommendation(s): Rhythm appears to be atrial flutter, I have also reviewed the EKG which demonstrates flutter waves. FINDINGS  Left Ventricle: No apical thrombus by Definity contrast. Left ventricular ejection fraction, by estimation, is 20 to 25%. The left ventricle has severely decreased function. The left ventricle demonstrates global hypokinesis. Definity contrast agent was  given IV to delineate the left ventricular endocardial borders. The left ventricular internal cavity size was mildly dilated. There is no left ventricular hypertrophy. Left ventricular diastolic function could not be evaluated due to possible atrial flutter. Indeterminate diastolic function, due to what appears to be atrial flutter. Right Ventricle: The right ventricular size is normal. No increase in right ventricular wall thickness. Right ventricular systolic function is hyperdynamic. Left Atrium: Left atrial size was normal in size. Right Atrium: Right atrial size was normal in size. Pericardium: There is no evidence of pericardial effusion. Mitral Valve: The mitral valve is grossly normal. No  evidence of mitral valve regurgitation. Tricuspid Valve: The tricuspid valve is grossly normal. Tricuspid valve regurgitation is not demonstrated. Aortic Valve: The aortic valve was not well visualized. Aortic valve regurgitation is not visualized. Mild aortic valve sclerosis is present, with no evidence of aortic valve stenosis. Pulmonic Valve: The pulmonic valve was normal in structure. Pulmonic valve regurgitation is not visualized. Aorta: The aortic root and ascending aorta are structurally normal, with no evidence of dilitation. Venous: The inferior vena cava is normal in size with greater than 50% respiratory variability, suggesting right atrial pressure of 3 mmHg. IAS/Shunts: No atrial level shunt detected by color flow Doppler.  LEFT VENTRICLE PLAX 2D LVIDd:         4.04 cm LVIDs:         3.62 cm LV PW:         0.76 cm LV IVS:        0.77 cm LVOT diam:     1.90 cm LV SV:         48 LV SV Index:   29 LVOT Area:     2.84 cm  LV Volumes (MOD) LV vol d, MOD A2C: 71.0 ml LV vol d, MOD A4C: 68.8 ml LV vol s, MOD A2C: 49.9 ml LV vol s, MOD A4C: 60.4 ml LV SV MOD A2C:     21.1 ml LV SV MOD A4C:     68.8 ml LV SV MOD BP:      14.9 ml RIGHT VENTRICLE TAPSE (M-mode): 2.2 cm LEFT ATRIUM             Index       RIGHT ATRIUM          Index LA diam:        2.50 cm 1.52 cm/m  RA Area:     8.27 cm LA Vol (A2C):   31.1 ml 18.94 ml/m RA Volume:   15.30 ml 9.32 ml/m LA Vol (A4C):   18.0 ml 10.96 ml/m LA Biplane Vol: 23.7 ml 14.44 ml/m  AORTIC VALVE LVOT Vmax:   113.00 cm/s LVOT Vmean:  73.900 cm/s LVOT VTI:    0.169 m  AORTA Ao Root diam: 2.90 cm  SHUNTS Systemic VTI:  0.17 m Systemic Diam: 1.90 cm Lyman Bishop MD Electronically signed by Lyman Bishop MD Signature Date/Time: 06/28/2019/3:34:15 PM    Final    [place] CORE BIOPSY (LYMPH NODES)  Result Date:  06/28/2019 INDICATION: 74 year old female with a 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 ANESTHESIA/SEDATION: None FLUOROSCOPY TIME:  None COMPLICATIONS: None PROCEDURE: Informed written consent was obtained from the patient after a thorough discussion of the procedural risks, benefits and alternatives. All questions were addressed. Maximal Sterile Barrier Technique was utilized including caps, mask, sterile gowns, sterile gloves, sterile drape, hand hygiene and skin antiseptic. A timeout was performed prior to the initiation of the procedure. Ultrasound survey was performed with images stored and sent to PACs. The left neck was prepped with chlorhexidine in a sterile fashion, and a sterile drape was applied covering the operative field. A sterile gown and sterile gloves were used for the procedure. Local anesthesia was provided with 1% Lidocaine. Ultrasound guidance was used to infiltrate the region with 1% lidocaine for local anesthesia. A small stab incision was made with 11 blade scalpel. Using ultrasound guidance, multiple 18 gauge core biopsy were acquired of the pathologic left supraclavicular node. These were placed in day fresh specimen container. Final image was stored after biopsy. Patient tolerated the procedure well and remained hemodynamically stable throughout. No complications were encountered and no significant blood loss was encounter IMPRESSION: Status post ultrasound-guided biopsy of left supraclavicular pathologic node. Signed, Dulcy Fanny. Dellia Nims, RPVI [facility] Radiology Electronically Signed   By: Corrie Mckusick D.O.   On: 06/28/2019 17:11      Subjective: "I think that's good."  Discharge Exam: Vitals:   07/12/19 2256 07/13/19 0532  BP: 122/65 134/80  Pulse: 98 100  Resp: 19 20  Temp: 98.1 F (36.7 C) 98.1 F (36.7 C)  SpO2: 94% 95%   Vitals:   07/12/19 0531 07/12/19 1416 07/12/19 2256 07/13/19 0532  BP: 133/82 124/77 122/65 134/80  Pulse: (!) 106 (!) 106 98 100   Resp:  20 19 20   Temp: 97.9 F (36.6 C) 98.7 F (37.1 C) 98.1 F (36.7 C) 98.1 F (36.7 C)  TempSrc: Oral Oral Oral Oral  SpO2: 94% 95% 94% 95%  Weight:      Height:       General: 74 y.o. female resting in bed in NAD Cardiovascular: RR, +S1, S2, no m/g/r, equal pulses throughout Respiratory: CTABL, no w/r/r, normal WOB GI: BS+, NDNT, no masses noted, no organomegaly noted MSK: No e/c/c Neuro: Alert to name, follows commands Psyc: Appropriate interaction and affect, calm/cooperative   The results of significant diagnostics from this hospitalization (including imaging, microbiology, ancillary and laboratory) are listed below for reference.     Microbiology: Recent Results (from the past 240 hour(s))  SARS Coronavirus 2 by RT PCR (hospital order, performed in [facility] hospital lab) Nasopharyngeal Nasopharyngeal Swab     Status: None   Collection Time: 07/09/19 12:30 PM   Specimen: Nasopharyngeal Swab  Result Value Ref Range Status   SARS Coronavirus 2 NEGATIVE NEGATIVE Final    Comment: (NOTE) SARS-CoV-2 target nucleic acids are NOT DETECTED. The SARS-CoV-2 RNA is generally detectable in upper and lower respiratory specimens during the acute phase of infection. The lowest concentration of SARS-CoV-2 viral copies this assay can detect is 250 copies / mL. A negative result does not preclude SARS-CoV-2 infection and should not be used as the sole basis for treatment or other patient management decisions.  A negative result may occur with improper specimen collection / handling, submission of specimen other than nasopharyngeal swab, presence of viral mutation(s) within  the areas targeted by this assay, and inadequate number of viral copies (<250 copies / mL). A negative result must be combined with clinical observations, patient history, and epidemiological information. Fact Sheet for Patients:   StrictlyIdeas.no Fact Sheet for Healthcare  Providers: BankingDealers.co.za This test is not yet approved or cleared  by the [place] FDA and has been authorized for detection and/or diagnosis of SARS-CoV-2 by FDA under an Emergency Use Authorization (EUA).  This EUA will remain in effect (meaning this test can be used) for the duration of the COVID-19 declaration under Section 564(b)(1) of the Act, 21 U.S.C. section 360bbb-3(b)(1), unless the authorization is terminated or revoked sooner. Performed at [facility], [place] [address], [place], [place] [address]   MRSA PCR Screening     Status: None   Collection Time: 07/10/19  6:03 PM   Specimen: Nasal Mucosa; Nasopharyngeal  Result Value Ref Range Status   MRSA by PCR NEGATIVE NEGATIVE Final    Comment:        The GeneXpert MRSA Assay (FDA approved for NASAL specimens only), is one component of a comprehensive MRSA colonization surveillance program. It is not intended to diagnose MRSA infection nor to guide or monitor treatment for MRSA infections. Performed at [facility], [place] [address], [place], [place] [address]      Labs: BNP (last 3 results) Recent Labs    06/28/19 0028  BNP 76.7   Basic Metabolic Panel: Recent Labs  Lab 07/08/19 1219 07/09/19 0734 07/10/19 0557 07/11/19 0608 07/12/19 0554  NA 127* 129* 130* 131* 132*  K 4.6 4.2 4.1 4.1 3.9  CL 98 99 101 100 100  CO2 19* 17* 17* 18* 19*  GLUCOSE 120* 107* 107* 101* 137*  BUN 14 19 11 11 22   CREATININE 0.89 0.75 0.47 0.35* 0.63  CALCIUM 9.6 8.5* 8.4* 9.0 8.7*  MG  --   --   --  2.3 2.4   Liver Function Tests: Recent Labs  Lab 07/08/19 1219 07/09/19 0734 07/10/19 0557 07/11/19 0608 07/12/19 0554  AST 363* 191* 155* 130* 92*  ALT 706* 457* 311* 252* 198*  ALKPHOS 541* 389* 326* 326* 295*  BILITOT 10.9* 11.6* 11.9* 14.7* 8.7*  PROT 8.1 7.6 7.2 7.7 7.6  ALBUMIN 2.6* 2.9* 2.6* 2.6* 2.6*   No results for input(s): LIPASE,  AMYLASE in the last 168 hours. No results for input(s): AMMONIA in the last 168 hours. CBC: Recent Labs  Lab 07/08/19 1219 07/08/19 1219 07/09/19 0734 07/10/19 0557 07/11/19 0608 07/12/19 0554 07/13/19 0530  WBC 15.9*  --  13.1* 10.3 10.5 9.7 5.5  NEUTROABS 12.8*  --   --  8.8* 9.3* 8.6* 4.4  HGB 10.3*  --  9.7* 9.2* 9.4* 9.3* 8.6*  HCT 30.6*   < > 29.0* 27.5* 28.2* 27.3* 26.1*  MCV 81.4   < > 82.4 81.4 81.0 80.3 82.3  PLT 548*  --  519* 521* 591* 579* 596*   < > = values in this interval not displayed.   Cardiac Enzymes: No results for input(s): CKTOTAL, CKMB, CKMBINDEX, TROPONINI in the last 168 hours. BNP: Invalid input(s): POCBNP CBG: Recent Labs  Lab 07/10/19 1159  GLUCAP 96   D-Dimer No results for input(s): DDIMER in the last 72 hours. Hgb A1c No results for input(s): HGBA1C in the last 72 hours. Lipid Profile No results for input(s): CHOL, HDL, LDLCALC, TRIG, CHOLHDL, LDLDIRECT in the last 72 hours. Thyroid function studies No results for input(s):  TSH, T4TOTAL, T3FREE, THYROIDAB in the last 72 hours.  Invalid input(s): FREET3 Anemia work up No results for input(s): VITAMINB12, FOLATE, FERRITIN, TIBC, IRON, RETICCTPCT in the last 72 hours. Urinalysis    Component Value Date/Time   COLORURINE AMBER (A) 06/28/2019 0330   APPEARANCEUR CLOUDY (A) 06/28/2019 0330   LABSPEC 1.024 06/28/2019 0330   PHURINE 7.0 06/28/2019 0330   GLUCOSEU NEGATIVE 06/28/2019 0330   HGBUR SMALL (A) 06/28/2019 0330   BILIRUBINUR NEGATIVE 06/28/2019 0330   KETONESUR NEGATIVE 06/28/2019 0330   PROTEINUR 100 (A) 06/28/2019 0330   NITRITE NEGATIVE 06/28/2019 0330   LEUKOCYTESUR LARGE (A) 06/28/2019 0330   Sepsis Labs Invalid input(s): PROCALCITONIN,  WBC,  LACTICIDVEN Microbiology Recent Results (from the past 240 hour(s))  SARS Coronavirus 2 by RT PCR (hospital order, performed in [place] hospital lab) Nasopharyngeal Nasopharyngeal Swab     Status: None   Collection Time:  07/09/19 12:30 PM   Specimen: Nasopharyngeal Swab  Result Value Ref Range Status   SARS Coronavirus 2 NEGATIVE NEGATIVE Final    Comment: (NOTE) SARS-CoV-2 target nucleic acids are NOT DETECTED. The SARS-CoV-2 RNA is generally detectable in upper and lower respiratory specimens during the acute phase of infection. The lowest concentration of SARS-CoV-2 viral copies this assay can detect is 250 copies / mL. A negative result does not preclude SARS-CoV-2 infection and should not be used as the sole basis for treatment or other patient management decisions.  A negative result may occur with improper specimen collection / handling, submission of specimen other than nasopharyngeal swab, presence of viral mutation(s) within the areas targeted by this assay, and inadequate number of viral copies (<250 copies / mL). A negative result must be combined with clinical observations, patient history, and epidemiological information. Fact Sheet for Patients:   StrictlyIdeas.no Fact Sheet for Healthcare Providers: BankingDealers.co.za This test is not yet approved or cleared  by the [place] FDA and has been authorized for detection and/or diagnosis of SARS-CoV-2 by FDA under an Emergency Use Authorization (EUA).  This EUA will remain in effect (meaning this test can be used) for the duration of the COVID-19 declaration under Section 564(b)(1) of the Act, 21 U.S.C. section 360bbb-3(b)(1), unless the authorization is terminated or revoked sooner. Performed at [facility], [place] [address], [place], [place] [address]   MRSA PCR Screening     Status: None   Collection Time: 07/10/19  6:03 PM   Specimen: Nasal Mucosa; Nasopharyngeal  Result Value Ref Range Status   MRSA by PCR NEGATIVE NEGATIVE Final    Comment:        The GeneXpert MRSA Assay (FDA approved for NASAL specimens only), is one component of a comprehensive MRSA  colonization surveillance program. It is not intended to diagnose MRSA infection nor to guide or monitor treatment for MRSA infections. Performed at [facility], [place] [address], [place], [place] [address]      Time coordinating discharge: 35 minutes  SIGNED:   Jonnie Finner, DO  [facility] 07/13/2019, 6:54 AM   If 7PM-7AM, please contact night-coverage [email or web address]

## 2019-07-15 ENCOUNTER — Encounter (HOSPITAL_COMMUNITY): Admission: RE | Payer: Self-pay | Source: Home / Self Care

## 2019-07-15 ENCOUNTER — Ambulatory Visit: Payer: Medicare (Managed Care)

## 2019-07-15 ENCOUNTER — Ambulatory Visit
Admission: RE | Admit: 2019-07-15 | Discharge: 2019-07-15 | Disposition: A | Discharge status: Home / Self Care | Payer: Medicare (Managed Care) | Source: Ambulatory Visit | Attending: Radiation Oncology | Admitting: Radiation Oncology

## 2019-07-15 ENCOUNTER — Ambulatory Visit (HOSPITAL_COMMUNITY)
Admission: RE | Admit: 2019-07-15 | Payer: Medicare (Managed Care) | Source: Home / Self Care | Admitting: Gastroenterology

## 2019-07-15 ENCOUNTER — Other Ambulatory Visit: Payer: Self-pay

## 2019-07-15 DIAGNOSIS — Z510 Encounter for antineoplastic radiation therapy: Secondary | ICD-10-CM | POA: Diagnosis not present

## 2019-07-15 DIAGNOSIS — C778 Secondary and unspecified malignant neoplasm of lymph nodes of multiple regions: Secondary | ICD-10-CM | POA: Diagnosis present

## 2019-07-15 DIAGNOSIS — C801 Malignant (primary) neoplasm, unspecified: Secondary | ICD-10-CM | POA: Diagnosis not present

## 2019-07-15 SURGERY — ENDOSCOPIC RETROGRADE CHOLANGIOPANCREATOGRAPHY (ERCP) WITH PROPOFOL
Anesthesia: General

## 2019-07-16 ENCOUNTER — Inpatient Hospital Stay: Payer: Medicare (Managed Care) | Admitting: Nutrition

## 2019-07-16 ENCOUNTER — Other Ambulatory Visit: Payer: Self-pay

## 2019-07-16 ENCOUNTER — Ambulatory Visit: Payer: Medicare (Managed Care)

## 2019-07-16 ENCOUNTER — Ambulatory Visit
Admission: RE | Admit: 2019-07-16 | Discharge: 2019-07-16 | Disposition: A | Discharge status: Home / Self Care | Payer: Medicare (Managed Care) | Source: Ambulatory Visit | Attending: Radiation Oncology | Admitting: Radiation Oncology

## 2019-07-16 ENCOUNTER — Encounter: Payer: Self-pay | Admitting: Nutrition

## 2019-07-16 DIAGNOSIS — Z510 Encounter for antineoplastic radiation therapy: Secondary | ICD-10-CM | POA: Diagnosis not present

## 2019-07-16 NOTE — Progress Notes (Signed)
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 Site of primary malignancy is not definitively determined, however but favored to represent right lung primary. There is also pancreatic lesion which may be an alternative primary. Tissue sampling is recommended. 2. Bulky mediastinal and right hilar adenopathy, nodal conglomerate causes narrowing of the right upper lobe bronchus, right bronchus intermedius, upper and lower lobe bronchi. Multiple right-sided pulmonary nodules, including pleural based nodules posteriorly. 3. Large liver lesion suspicious for metastasis. There is biliary dilatation which is likely secondary to mass effect on the common bile duct from periportal adenopathy. 4. Multifocal abdominal adenopathy throughout the retroperitoneum, periportal region, central mesentery. 5. Pancreatic mass measuring 4.3 cm which may be metastasis or primary malignancy. 6. Right adrenal mass and left adrenal thickening. 7. Subcutaneous metastasis to the right chest wall. Left paraspinal intramuscular metastasis at the level of L3. 8. Non oncologic findings of right renal atrophy with  large nonobstructing stones in the lower right kidney.   Aortic Atherosclerosis (ICD10-I70.0) and Emphysema (ICD10-J43.9).   06/28/2019 Initial Biopsy   FINAL MICROSCOPIC DIAGNOSIS:   A. Lymph node, biopsy:  -  Metastatic neuroendocrine carcinoma, high-grade  -  See comment   COMMENT:   By immunohistochemistry, the neoplastic cells are positive for  cytokeratin 7, chromogranin, synaptic ficin and TTF-1 but negative for  cytokeratin 20.  The proliferative rate by Ki-67 immunohistochemistry is  approximately 80%.  Overall, the findings are consistent with a  high-grade neuroendocrine carcinoma.  The differential includes large  cell neuroendocrine carcinoma.  Small cell carcinoma is less likely  given the overall cytologic appearance.   07/05/2019 Imaging   US Abdomen  IMPRESSION: 6.1 cm solitary mass seen in right hepatic lobe consistent with metastatic disease.   Multiple masses are seen involving the pancreas, with the largest measuring 3.9 cm in the pancreatic tail.   Common bile duct is mildly dilated at 8 mm concerning for distal common bile duct obstruction. Potentially this may be due to previously described masses.   Gallbladder is not visualized.   Calyceal dilatation is again noted involving the right kidney, with probable nonobstructive calculus seen in lower pole collecting system.   07/08/2019 Initial Diagnosis   High grade neuroendocrine carcinoma ([place])   07/08/2019 - 07/22/2019 Radiation Therapy   Palliative RT to the lung mass and paraspinal met 07/08/19-07/22/19 with Dr Lisbeth Renshaw    07/11/2019 Procedure   ERCP by Dr stark 07/11/19  IMPRESSION - The major papilla appeared normal. - The right intrahepatic ducts, left hepatic/intrahepatic ducts and proximal common bile duct were moderately dilated, with a right hepatic duct stricture and a mid common bile duct stricture causing obstruction. - One stent was placed into the common bile duct and right intrahepatic duct  across both strictures.  CURRENT THERAPY:  Palliative RT starting 07/08/19-07/22/19 Supportive Care   INTERVAL HISTORY:  Alisha Goodman is here for a follow up. She presents to the clinic with family. She notes her pain has improved on RT. She feels like RT has helped her left upper leg pain. She notes no breathing issues. She is still losing weight. She is trying to eat more. She notes she does not have appetite for certain foods. She is taking Ensure and Boost once daily. She is taking Mirtazapine. She does not take Xanax because it zones her out. She is able to go to bathroom by herself. She notes her urine is improving. She takes 1-2 of breakthrough pain meds and uses long acting pain med BID. She notes she is mostly sleeping or watching TV at home. She has a cough that impacts her cough and makes her anxious. She declined chemo at this time and is open to proceed with hospice and DNR/DNI.    REVIEW OF SYSTEMS:   Constitutional: Denies fevers, chills (+)low appetite, weight loss Eyes: Denies blurriness of vision Ears, nose, mouth, throat, and face: Denies mucositis or sore throat Respiratory: Denies dyspnea or wheezes (+) Cough  Cardiovascular: Denies palpitation, chest discomfort or lower extremity swelling Gastrointestinal:  Denies nausea, heartburn or change in bowel habits Skin: Denies abnormal skin rashes Lymphatics: Denies new lymphadenopathy or easy bruising Neurological:Denies numbness, tingling or new weaknesses Behavioral/Psych: Mood is stable, no new changes  All other systems were reviewed with the patient and are negative.  MEDICAL HISTORY:  Past Medical History:  Diagnosis Date  . Aortic atherosclerosis ([place]) 06/29/2019  . Atrial flutter ([place]) 06/29/2019   possible dx  . Chronic systolic CHF (congestive heart failure) ([place]) 06/29/2019  . Emphysema of lung ([place]) 06/29/2019  . Hyperlipidemia   . Hypertension   . Lumbar radiculopathy   . Metastatic cancer ([place])  06/28/2019  . Pain of metastatic malignancy    left paraspinal mass in lumbar area    SURGICAL HISTORY: Past Surgical History:  Procedure Laterality Date  . ABDOMINAL HYSTERECTOMY    . ERCP N/A 07/11/2019   Procedure: ENDOSCOPIC RETROGRADE CHOLANGIOPANCREATOGRAPHY (ERCP);  Surgeon: Ladene Artist, MD;  Location: [facility];  Service: Endoscopy;  Laterality: N/A;  . PANCREATIC STENT PLACEMENT  07/11/2019   Procedure: PANCREATIC STENT PLACEMENT;  Surgeon: Ladene Artist, MD;  Location: [facility];  Service: Endoscopy;;    I have reviewed the social history and family history with the patient and they are unchanged from previous note.  ALLERGIES:  is allergic to sulfa antibiotics and aspirin.  MEDICATIONS:  Current Outpatient Medications  Medication Sig Dispense Refill  . ALPRAZolam (XANAX) 0.25 MG tablet Take 1 tablet (0.25 mg total) by mouth at bedtime as needed for anxiety. 30 tablet 0  . benzonatate (TESSALON) 100 MG capsule Take 1 capsule (100 mg total) by mouth 3 (three) times daily as needed for cough. 30 capsule 0  . cloNIDine (CATAPRES) 0.2 MG tablet Take 1 tablet (0.2 mg total) by mouth 2 (two) times daily. 60 tablet 0  . guaiFENesin (MUCINEX) 600 MG 12 hr tablet Take 1 tablet (600 mg total) by mouth 2 (two) times daily for 5 days. 10 tablet 0  . HYDROcodone-homatropine (HYCODAN) 5-1.5 MG/5ML syrup Take 5 mLs by mouth every 6 (six) hours as needed for cough. 120 mL 0  . irbesartan (AVAPRO) 150 MG tablet Take 1 tablet (150 mg total) by mouth daily. 30 tablet 2  . megestrol (MEGACE ES) 625  MG/5ML suspension Take 5 mLs (625 mg total) by mouth daily. 150 mL 1  . metoprolol succinate (TOPROL XL) 25 MG 24 hr tablet Take 3 tablets (75 mg total) by mouth daily. 90 tablet 1  . mirtazapine (REMERON) 7.5 MG tablet Take 1 tablet (7.5 mg total) by mouth at bedtime. 30 tablet 0  . nicotine (NICODERM CQ - DOSED IN MG/24 HOURS) 21 mg/24hr patch Place 1 patch (21 mg total) onto the skin  daily. 28 patch 0  . oxyCODONE (OXY IR/ROXICODONE) 5 MG immediate release tablet Take 1 tablet (5 mg total) by mouth every 6 (six) hours as needed for breakthrough pain. 45 tablet 0  . oxyCODONE (OXYCONTIN) 15 mg 12 hr tablet Take 1 tablet (15 mg total) by mouth every 12 (twelve) hours. 60 tablet 0  . senna (SENOKOT) 8.6 MG TABS tablet Take 1 tablet (8.6 mg total) by mouth in the morning and at bedtime. 120 tablet 0  . spironolactone (ALDACTONE) 25 MG tablet Take 0.5 tablets (12.5 mg total) by mouth daily. 30 tablet 0   No current facility-administered medications for this visit.    PHYSICAL EXAMINATION: ECOG PERFORMANCE STATUS: 3 - Symptomatic, >50% confined to bed  Vitals:   07/18/19 1501  BP: 108/64  Pulse: 92  Resp: 18  Temp: 97.8 F (36.6 C)  SpO2: 98%   Filed Weights   07/18/19 1501  Weight: 112 lb 3.2 oz (50.9 kg)    GENERAL:alert, no distress and comfortable SKIN: skin color, texture, turgor are normal, no rashes or significant lesions EYES: normal, Conjunctiva are pink and non-injected (+) Improving jaundice  NECK: supple, thyroid normal size, non-tender, without nodularity LYMPH:  no palpable lymphadenopathy in the cervical, axillary  LUNGS: clear to auscultation and percussion with normal breathing effort HEART: regular rate & rhythm and no murmurs and no lower extremity edema ABDOMEN:abdomen soft, non-tender and normal bowel sounds Musculoskeletal:no cyanosis of digits and no clubbing  NEURO: alert & oriented x 3 with fluent speech, no focal motor/sensory deficits  LABORATORY DATA:  I have reviewed the data as listed CBC Latest Ref Rng & Units 07/18/2019 07/13/2019 07/12/2019  WBC 4.0 - 10.5 K/uL 4.3 5.5 9.7  Hemoglobin 12.0 - 15.0 g/dL 8.8(L) 8.6(L) 9.3(L)  Hematocrit 36 - 46 % 26.9(L) 26.1(L) 27.3(L)  Platelets 150 - 400 K/uL 394 596(H) 579(H)     CMP Latest Ref Rng & Units 07/18/2019 07/13/2019 07/12/2019  Glucose 70 - 99 mg/dL 129(H) 80 137(H)  BUN 8 - 23  mg/dL _0 Creatinine 0.44 - 1.00 mg/dL 0.66 0.51 0.63  Sodium 135 - 145 mmol/L 132(L) 134(L) 132(L)  Potassium 3.5 - 5.1 mmol/L 3.8 3.7 3.9  Chloride 98 - 111 mmol/L 101 102 100  CO2 22 - 32 mmol/L 22 21(L) 19(L)  Calcium 8.9 - 10.3 mg/dL 8.8(L) 8.5(L) 8.7(L)  Total Protein 6.5 - 8.1 g/dL 7.2 6.8 7.6  Total Bilirubin 0.3 - 1.2 mg/dL 3.0(H) 6.0(H) 8.7(H)  Alkaline Phos 38 - 126 U/L 342(H) 249(H) 295(H)  AST 15 - 41 U/L 115(H) 74(H) 92(H)  ALT 0 - 44 U/L 164(H) 149(H) 198(H)      RADIOGRAPHIC STUDIES: I have personally reviewed the radiological images as listed and agreed with the findings in the report. No results found.   ASSESSMENT & PLAN:  Alisha Goodman is a 74 y.o. female with   1.Metastatichigh grade neuroendocrine tumor of pancreas, withmediastinal and abdominal nodes, lung, liverand spinalmetastases -She presentedwith lefthip/legpain, work up revealed widespread  disease concerning for malignancy.  -She underwent left [place] LN biopsy on 06/28/19 which revealed metastatic high grade neuroendocrine carcinoma felt to arise from the pancreas. By [facility] cells are positive for cytokeratin-7, chromogranin, synaptoficin and TTF-1 but negative for cytokeratin 20. The proliferative rate by Ki-67 is 80% which makes this poorly differentiated and high grade. I previously discussed thetypicalnature of this aggressive cancer behaves similarly to small cell cancer. -She started palliative radiation to thelung mass andparaspinalmetfelt to be causing her left leg/hip pain on 07/08/19 by Dr. Lisbeth Renshaw. She will complete on 07/22/19.  -Unfortunately she has developed rapid worsening of liver function in the past 2 weeks. she was admitted to [facility] and underwent ERCP and stent placement 07/11/19.  -Her Jaundice is improving. Labs reviewed, Hg 8.8, CMP still pending. Her left thigh pain is improving on Palliative RT. I reviewed the next step with her after radiation. Given her current low weight and  low performance status, I do not think she can tolerate more chemo at this point although it is the only option left for treatment.  -I discussed the option of chemo vs palliative or hospice care based on her home needs to help her and manage her symptoms given poor prognosis. Due to her poor PS,she is not a good candidate for chemo and I reviewed potential side effects. I encouraged her to consider hospice and reviewed their services with her and her family. After a lengthy discussion, she and her family agreed to hospice home care. I will send referral. I will continue to manage her overall care.  -She has been having cough that is impacting her sleep. She has Tessalon but not enough. I will call in stronger hycodan (07/15/19).  -F/u open   2. Juandice, secondary to external compression from portal adenopathy and pancreatic mass -Tbili increased from 3.2 on 6/1 to 5.2 on 6/3, now 10.9 today  -She has rapidly progressing liver failure, unfortunately this appears to be a very aggressive disease  -Her 07/08/19 Tbili increased to 10.9. I admitted her to ED same day for ERCP which she underwent 07/11/19 to have CBD stent placed.  -Her Jaundice of urine and eyes are improving. Labs today show AST 115, ALT 164, Alk Phos 342, Tbili 3 (07/18/19).    3.Left hip and thigh pain, likelyrelated to the left paraspinal metastatic lesion -She plans to start palliative radiation with Dr. Lisbeth Renshaw 07/08/19-07/22/19 -Pain mostly controlled on OxyContin q12 BID and oxyIR q6 PRN which she is using 1-2 times a day. -Her left thigh pain has improved on palliative RT.   4. Weight loss, low performance status, secondary to #1 -I prescribed mirtazapine for appetite, sleep, and mood. She has not picked up yet.  -She is doing PT.  -Her best input is with liquid with low food intake. I again recommend she increase Ensure to 2-3 times a day.  -She is on Mirtazapine. I dicussed also trying Megace, she is agreeable (07/18/19).    5. Goals of care, DNR/DNI, Anxiety  -She has very close, supportive family who wish to protect her from upsetting news, but we all agree she should know the gravity of her condition. -She understands her cancer is not curable, but treatable if her condition improves and she becomes candidate for chemotherapy. She has declined trying chemo at this point.  -I have discussed palliative care vs hospice. She is willing to proceed with hospice at this time. I recommend DNR/DNI, she has agreed to DNR/DNI.  -I discussed if her family  has any cost issues she can request medication replacement or I will call in another medication.   6. Hyponatremia -Due to poor nutrition and hydration -Gave IVF 07/04/19 and instructed to increase fluids at home   PLAN: -Continue Radiation until 07/22/19 -I called in hycodan and refilled Tessalon today -Send Hospice Referral to start after RT.  -F/u open, I will remain to be her MD when she is under hospice care   No problem-specific Assessment & Plan notes found for this encounter.   No orders of the defined types were placed in this encounter.  All questions were answered. The patient knows to call the clinic with any problems, questions or concerns. No barriers to learning was detected. The total time spent in the appointment was 50 minutes.     Truitt Merle, MD 07/18/2019   I, Joslyn Devon, am acting as scribe for Truitt Merle, MD.   I have reviewed the above documentation for accuracy and completeness, and I agree with the above.

## 2019-07-16 NOTE — Progress Notes (Signed)
Patient did not show up for nutrition appointment. 

## 2019-07-17 ENCOUNTER — Other Ambulatory Visit: Payer: Self-pay

## 2019-07-17 ENCOUNTER — Ambulatory Visit
Admission: RE | Admit: 2019-07-17 | Discharge: 2019-07-17 | Disposition: A | Discharge status: Home / Self Care | Payer: Medicare (Managed Care) | Source: Ambulatory Visit | Attending: Radiation Oncology | Admitting: Radiation Oncology

## 2019-07-17 ENCOUNTER — Ambulatory Visit: Payer: Medicare (Managed Care)

## 2019-07-17 DIAGNOSIS — Z510 Encounter for antineoplastic radiation therapy: Secondary | ICD-10-CM | POA: Diagnosis not present

## 2019-07-18 ENCOUNTER — Encounter: Payer: Self-pay | Admitting: Hematology

## 2019-07-18 ENCOUNTER — Ambulatory Visit
Admission: RE | Admit: 2019-07-18 | Discharge: 2019-07-18 | Disposition: A | Discharge status: Home / Self Care | Payer: Medicare (Managed Care) | Source: Ambulatory Visit | Attending: Radiation Oncology | Admitting: Radiation Oncology

## 2019-07-18 ENCOUNTER — Inpatient Hospital Stay: Payer: Medicare (Managed Care)

## 2019-07-18 ENCOUNTER — Ambulatory Visit: Payer: Medicare (Managed Care)

## 2019-07-18 ENCOUNTER — Inpatient Hospital Stay (HOSPITAL_BASED_OUTPATIENT_CLINIC_OR_DEPARTMENT_OTHER): Payer: Medicare (Managed Care) | Admitting: Hematology

## 2019-07-18 ENCOUNTER — Other Ambulatory Visit: Payer: Self-pay

## 2019-07-18 VITALS — BP 108/64 | HR 92 | Temp 97.8°F | Resp 18 | Ht 65.0 in | Wt 112.2 lb

## 2019-07-18 DIAGNOSIS — K869 Disease of pancreas, unspecified: Secondary | ICD-10-CM | POA: Diagnosis not present

## 2019-07-18 DIAGNOSIS — E279 Disorder of adrenal gland, unspecified: Secondary | ICD-10-CM | POA: Diagnosis not present

## 2019-07-18 DIAGNOSIS — C7A1 Malignant poorly differentiated neuroendocrine tumors: Secondary | ICD-10-CM

## 2019-07-18 DIAGNOSIS — J439 Emphysema, unspecified: Secondary | ICD-10-CM | POA: Diagnosis not present

## 2019-07-18 DIAGNOSIS — R634 Abnormal weight loss: Secondary | ICD-10-CM | POA: Diagnosis not present

## 2019-07-18 DIAGNOSIS — C7A8 Other malignant neuroendocrine tumors: Secondary | ICD-10-CM | POA: Diagnosis not present

## 2019-07-18 DIAGNOSIS — F419 Anxiety disorder, unspecified: Secondary | ICD-10-CM | POA: Diagnosis not present

## 2019-07-18 DIAGNOSIS — N200 Calculus of kidney: Secondary | ICD-10-CM | POA: Diagnosis not present

## 2019-07-18 DIAGNOSIS — E871 Hypo-osmolality and hyponatremia: Secondary | ICD-10-CM | POA: Diagnosis not present

## 2019-07-18 DIAGNOSIS — I700 Atherosclerosis of aorta: Secondary | ICD-10-CM | POA: Diagnosis not present

## 2019-07-18 DIAGNOSIS — C7B8 Other secondary neuroendocrine tumors: Secondary | ICD-10-CM

## 2019-07-18 DIAGNOSIS — Z882 Allergy status to sulfonamides status: Secondary | ICD-10-CM | POA: Diagnosis not present

## 2019-07-18 DIAGNOSIS — Z886 Allergy status to analgesic agent status: Secondary | ICD-10-CM | POA: Diagnosis not present

## 2019-07-18 DIAGNOSIS — M25559 Pain in unspecified hip: Secondary | ICD-10-CM | POA: Diagnosis not present

## 2019-07-18 DIAGNOSIS — Z66 Do not resuscitate: Secondary | ICD-10-CM | POA: Diagnosis not present

## 2019-07-18 DIAGNOSIS — Z79899 Other long term (current) drug therapy: Secondary | ICD-10-CM | POA: Diagnosis not present

## 2019-07-18 DIAGNOSIS — I5022 Chronic systolic (congestive) heart failure: Secondary | ICD-10-CM | POA: Diagnosis not present

## 2019-07-18 DIAGNOSIS — M79652 Pain in left thigh: Secondary | ICD-10-CM | POA: Diagnosis not present

## 2019-07-18 DIAGNOSIS — K7290 Hepatic failure, unspecified without coma: Secondary | ICD-10-CM | POA: Diagnosis not present

## 2019-07-18 DIAGNOSIS — K831 Obstruction of bile duct: Secondary | ICD-10-CM | POA: Diagnosis not present

## 2019-07-18 DIAGNOSIS — R2242 Localized swelling, mass and lump, left lower limb: Secondary | ICD-10-CM | POA: Diagnosis not present

## 2019-07-18 DIAGNOSIS — Z510 Encounter for antineoplastic radiation therapy: Secondary | ICD-10-CM | POA: Diagnosis not present

## 2019-07-18 LAB — CBC WITH DIFFERENTIAL (CANCER CENTER ONLY)
Abs Immature Granulocytes: 0.04 10*3/uL (ref 0.00–0.07)
Basophils Absolute: 0 10*3/uL (ref 0.0–0.1)
Basophils Relative: 0 %
Eosinophils Absolute: 0 10*3/uL (ref 0.0–0.5)
Eosinophils Relative: 0 %
HCT: 26.9 % — ABNORMAL LOW (ref 36.0–46.0)
Hemoglobin: 8.8 g/dL — ABNORMAL LOW (ref 12.0–15.0)
Immature Granulocytes: 1 %
Lymphocytes Relative: 7 %
Lymphs Abs: 0.3 10*3/uL — ABNORMAL LOW (ref 0.7–4.0)
MCH: 27.4 pg (ref 26.0–34.0)
MCHC: 32.7 g/dL (ref 30.0–36.0)
MCV: 83.8 fL (ref 80.0–100.0)
Monocytes Absolute: 0.5 10*3/uL (ref 0.1–1.0)
Monocytes Relative: 12 %
Neutro Abs: 3.5 10*3/uL (ref 1.7–7.7)
Neutrophils Relative %: 80 %
Platelet Count: 394 10*3/uL (ref 150–400)
RBC: 3.21 MIL/uL — ABNORMAL LOW (ref 3.87–5.11)
RDW: 18.6 % — ABNORMAL HIGH (ref 11.5–15.5)
WBC Count: 4.3 10*3/uL (ref 4.0–10.5)
nRBC: 0 % (ref 0.0–0.2)

## 2019-07-18 LAB — CMP (CANCER CENTER ONLY)
ALT: 164 U/L — ABNORMAL HIGH (ref 0–44)
AST: 115 U/L — ABNORMAL HIGH (ref 15–41)
Albumin: 2.4 g/dL — ABNORMAL LOW (ref 3.5–5.0)
Alkaline Phosphatase: 342 U/L — ABNORMAL HIGH (ref 38–126)
Anion gap: 9 (ref 5–15)
BUN: 11 mg/dL (ref 8–23)
CO2: 22 mmol/L (ref 22–32)
Calcium: 8.8 mg/dL — ABNORMAL LOW (ref 8.9–10.3)
Chloride: 101 mmol/L (ref 98–111)
Creatinine: 0.66 mg/dL (ref 0.44–1.00)
GFR, Est AFR Am: >60 mL/min (ref >60)
GFR, Estimated: >60 mL/min (ref >60)
Glucose, Bld: 129 mg/dL — ABNORMAL HIGH (ref 70–99)
Potassium: 3.8 mmol/L (ref 3.5–5.1)
Sodium: 132 mmol/L — ABNORMAL LOW (ref 135–145)
Total Bilirubin: 3 mg/dL — ABNORMAL HIGH (ref 0.3–1.2)
Total Protein: 7.2 g/dL (ref 6.5–8.1)

## 2019-07-18 MED ORDER — BENZONATATE 100 MG PO CAPS: 100.0000 mg | ORAL_CAPSULE | Freq: Three times a day (TID) | ORAL | 0 refills | Status: AC | PRN

## 2019-07-18 MED ORDER — HYDROCODONE-HOMATROPINE 5-1.5 MG/5ML PO SYRP: 5.0000 mL | ORAL_SOLUTION | Freq: Four times a day (QID) | ORAL | 0 refills | Status: AC | PRN

## 2019-07-18 MED ORDER — MEGESTROL ACETATE 625 MG/5ML PO SUSP
625.0000 mg | Freq: Every day | ORAL | 1 refills | Status: AC
Start: 2019-07-18 — End: ?

## 2019-07-19 ENCOUNTER — Other Ambulatory Visit: Payer: Self-pay

## 2019-07-19 ENCOUNTER — Ambulatory Visit: Payer: Medicare (Managed Care)

## 2019-07-19 ENCOUNTER — Other Ambulatory Visit: Payer: Self-pay | Admitting: Radiation Oncology

## 2019-07-19 ENCOUNTER — Ambulatory Visit
Admission: RE | Admit: 2019-07-19 | Discharge: 2019-07-19 | Disposition: A | Discharge status: Home / Self Care | Payer: Medicare (Managed Care) | Source: Ambulatory Visit | Attending: Radiation Oncology | Admitting: Radiation Oncology

## 2019-07-19 ENCOUNTER — Telehealth: Payer: Self-pay

## 2019-07-19 DIAGNOSIS — Z510 Encounter for antineoplastic radiation therapy: Secondary | ICD-10-CM | POA: Diagnosis not present

## 2019-07-19 DIAGNOSIS — C7A1 Malignant poorly differentiated neuroendocrine tumors: Secondary | ICD-10-CM

## 2019-07-19 MED ORDER — PROCHLORPERAZINE MALEATE 10 MG PO TABS
10.0000 mg | ORAL_TABLET | Freq: Three times a day (TID) | ORAL | 0 refills | Status: AC | PRN
Start: 2019-07-19 — End: ?

## 2019-07-19 NOTE — Telephone Encounter (Signed)
Ov note form 07/18/2019, demographics information, and referral faxed to [facility] at [phone]

## 2019-07-22 ENCOUNTER — Ambulatory Visit
Admission: RE | Admit: 2019-07-22 | Discharge: 2019-07-22 | Disposition: A | Discharge status: Home / Self Care | Payer: Medicare (Managed Care) | Source: Ambulatory Visit | Attending: Radiation Oncology | Admitting: Radiation Oncology

## 2019-07-22 ENCOUNTER — Other Ambulatory Visit: Payer: Self-pay

## 2019-07-22 ENCOUNTER — Encounter: Payer: Self-pay | Admitting: Radiation Oncology

## 2019-07-22 DIAGNOSIS — Z510 Encounter for antineoplastic radiation therapy: Secondary | ICD-10-CM | POA: Diagnosis not present

## 2019-07-24 ENCOUNTER — Encounter: Payer: Self-pay | Admitting: *Deleted

## 2019-07-24 NOTE — Progress Notes (Signed)
Patient ID: Alisha Goodman, female   DOB: Aug 13, 1945, 74 y.o.   MRN: [id number] Patient has been advised not to wear ZIO XT monitor due to radiation treatments.  Daughter will be returning monitor to [facility] unused.

## 2019-07-26 ENCOUNTER — Other Ambulatory Visit: Payer: Self-pay | Admitting: Nurse Practitioner

## 2019-08-02 ENCOUNTER — Encounter (HOSPITAL_COMMUNITY): Payer: Self-pay

## 2019-08-02 ENCOUNTER — Ambulatory Visit (HOSPITAL_COMMUNITY): Payer: Medicare (Managed Care)

## 2019-08-10 ENCOUNTER — Encounter (HOSPITAL_COMMUNITY): Payer: Self-pay

## 2019-08-10 ENCOUNTER — Other Ambulatory Visit (HOSPITAL_COMMUNITY): Payer: Medicare (Managed Care)

## 2019-08-28 ENCOUNTER — Telehealth: Payer: Self-pay | Admitting: Radiation Oncology

## 2019-08-28 NOTE — Telephone Encounter (Signed)
  Radiation Oncology         [phone] ________________________________  Name: Alisha Goodman MRN: [id number]  Date of Service: 08/28/2019  DOB: [birth year]  Post Treatment  Note  Diagnosis:  Metastatic Neuroendocrine carcinoma of either the lung or pancreas with bony and liver metastases.  Interval Since Last Radiation:  5 weeks   07/08/19-07/22/19: The mediastinum and lumbar spine were treated to 30 Gy in 10 fractions.  Narrative:  The patient was contacted today for routine follow-up. During treatment she did very well with radiotherapy and did not have significant desquamation. She was referred to hospice care last month.  Impression/Plan: 1. Metastatic Neuroendocrine carcinoma of either the lung or pancreas with bony and liver metastases. The patient completed treatment and was referred to hospice care following treatment.    Carola Rhine, PAC

## 2019-09-09 NOTE — Progress Notes (Signed)
  Radiation Oncology         [phone]) [phone] ________________________________  Name: Alisha Goodman MRN: [id number]  Date: 07/22/2019  DOB: 1945-06-24  End of Treatment Note  Diagnosis:   bone metastasis     Indication for treatment::  palliative       Radiation treatment dates:   07/08/19 - 07/22/19  Site/dose:   1. The L-spine was treated to a dose of 30 Gy in 10 fractions using an isodose plan. This consisted of 2 fields. 2. The mediastinum was treated to a dose of 30 Gy in 10 fractions using an 3D conformal plan. This consisted of 3 fields.   Narrative: The patient tolerated radiation treatment relatively well.     Plan: The patient has completed radiation treatment. The patient will return to radiation oncology clinic for routine followup in one month. I advised the patient to call or return sooner if they have any questions or concerns related to their recovery or treatment. ________________________________  Jodelle Gross, M.D., Ph.D.

## 2019-09-16 ENCOUNTER — Telehealth: Payer: Self-pay

## 2019-09-16 NOTE — Telephone Encounter (Signed)
Received fax notification from [facility] of Ms Alisha Goodman's death on 09/22/2019.

## 2019-10-02 DEATH — deceased

## 2021-11-06 IMAGING — CT CT ABD-PELV W/ CM
2 of 5 series · 11 of 36 positions shown, 13 images · IV contrast (OMNIPAQUE 300)
Comparison: Chest radiograph earlier this day. Remote abdominal CT
[id number]

CLINICAL DATA: Mediastinal mass on x-ray. Elevated LFTs. Left back
pain.

EXAM:
CT CHEST, ABDOMEN, AND PELVIS WITH CONTRAST
TECHNIQUE: Multidetector CT imaging of the chest, abdomen and pelvis was
performed following the standard protocol during bolus
administration of intravenous contrast.
CONTRAST:  100mL OMNIPAQUE IOHEXOL 300 MG/ML  SOLN

[Series 2: cap with · axial · 0.71mm/px · z∈[+1128,+1608]mm · 8 of 121 slices shown, 10 images]
[im 13/121  mediastinal]
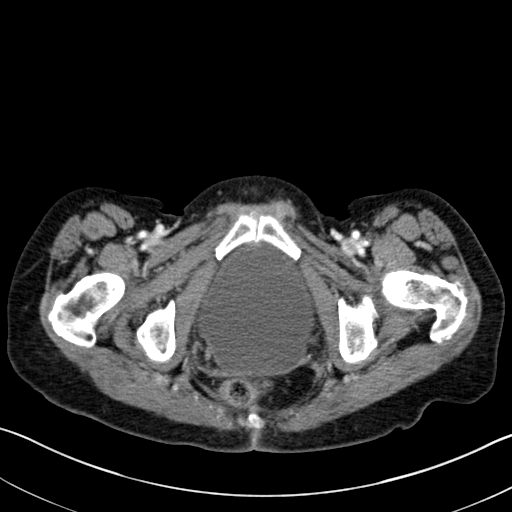
[im 13/121  lung]
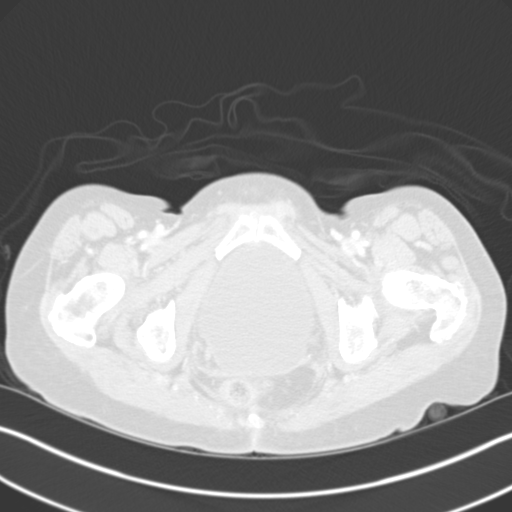
[im 25/121  lung]
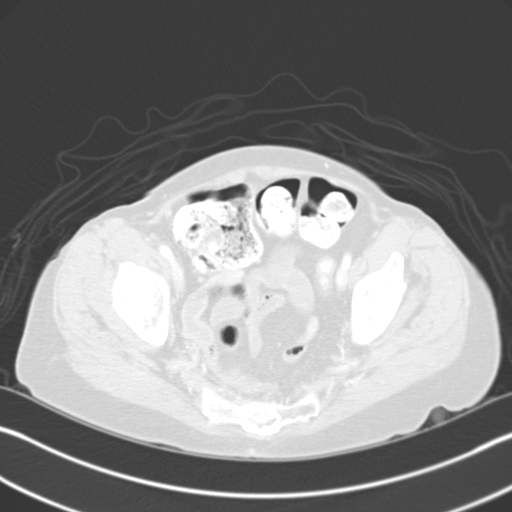
[im 37/121  lung]
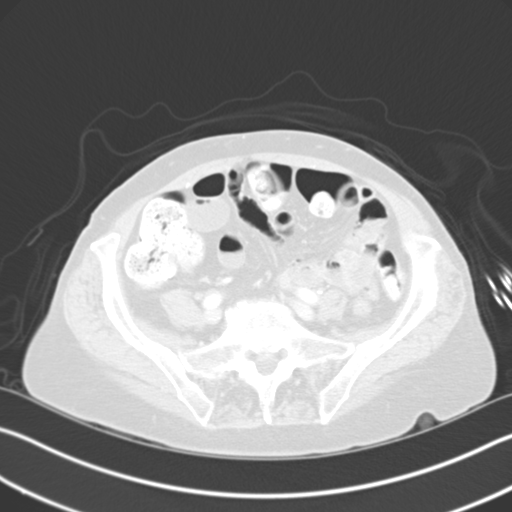
[im 49/121  lung]
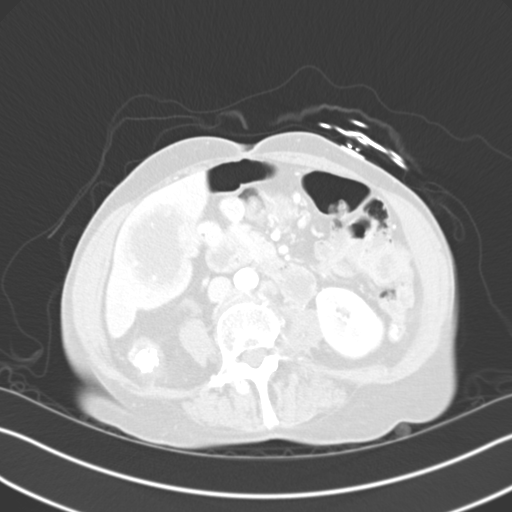
[im 73/121  mediastinal]
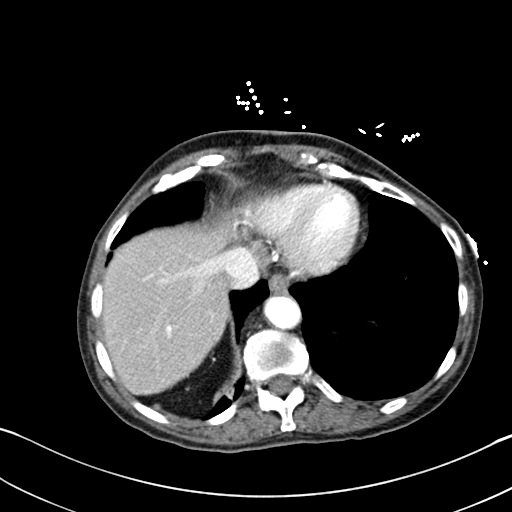
[im 73/121  lung]
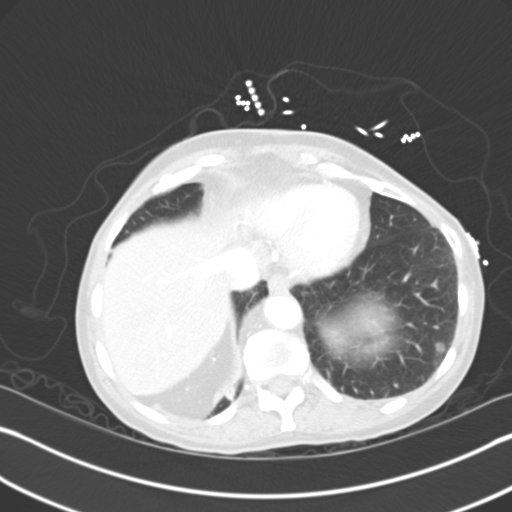
[im 85/121  lung]
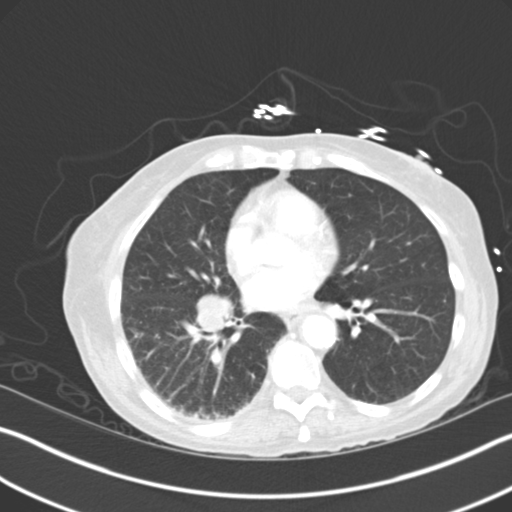
[im 97/121  lung]
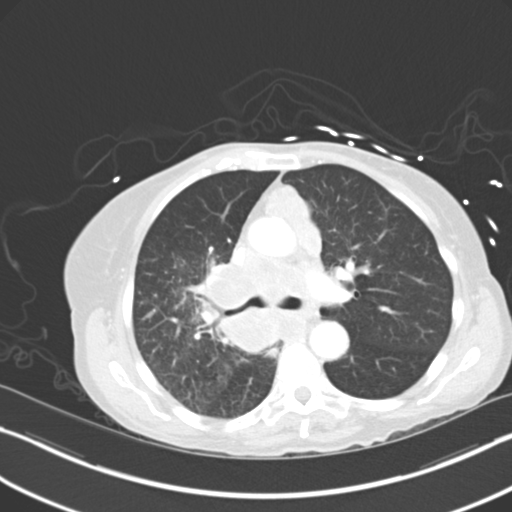
[im 109/121  lung]
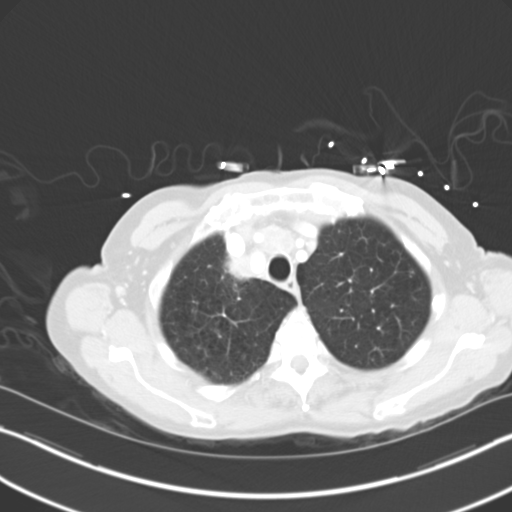

[Series 4: coronals · coronal · 0.62mm/px · 3 of 112 slices shown]
[im 23/112  lung]
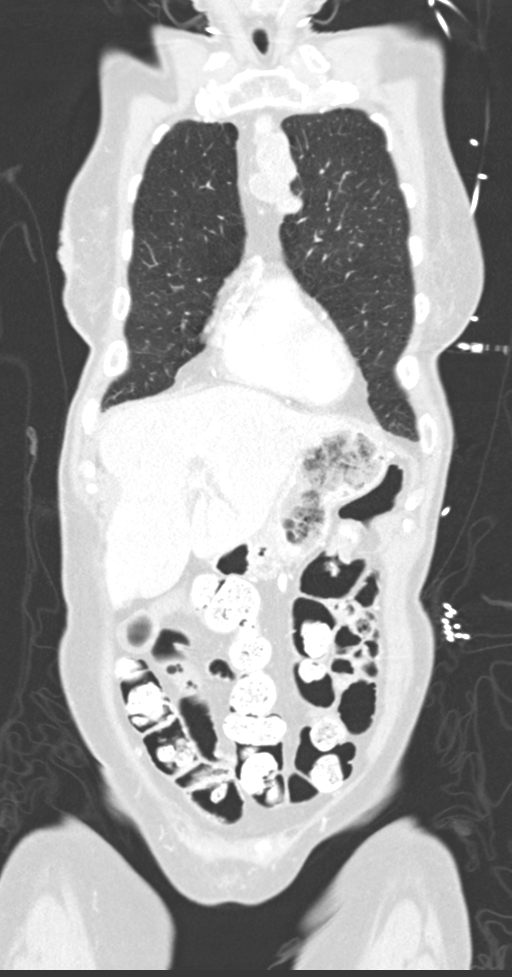
[im 45/112  lung]
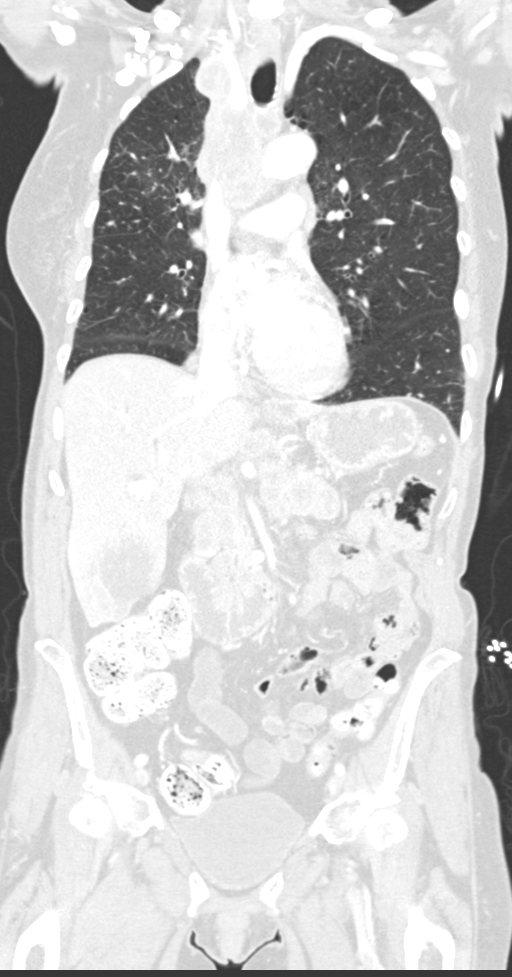
[im 67/112  lung]
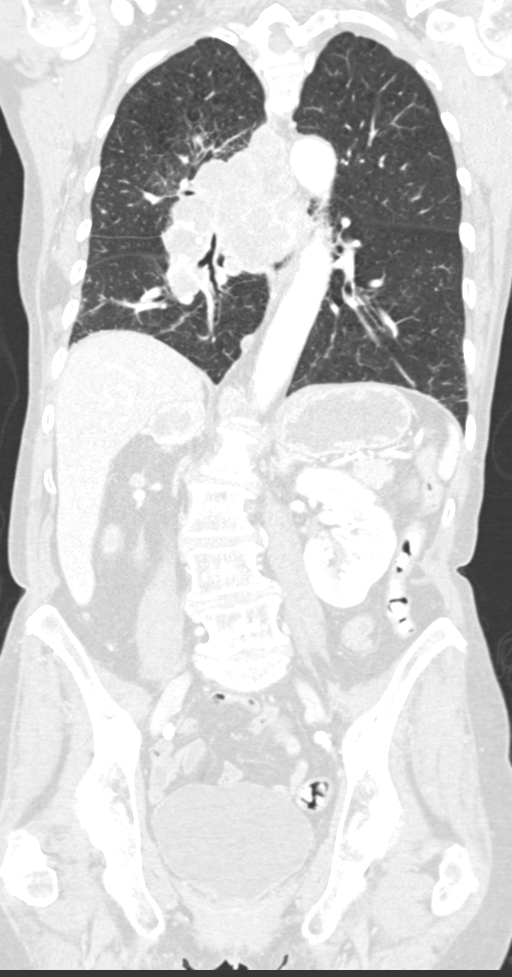

[11 of 36 positions shown; findings below may reference images not displayed]

FINDINGS: CT CHEST FINDINGS

Cardiovascular: Aortic atherosclerosis. Aortic tortuosity without
aneurysm. Heart is normal in size. Coronary artery calcifications.
No pericardial effusion. Extensive mediastinal adenopathy causes
mass effect and narrowing of the right pulmonary arteries. No
obvious intraluminal filling defect.

Mediastinum/Nodes: Extensive mediastinal and right hilar adenopathy,
with confluent nodal conglomerate extending from the subcarinal
station to the level of the mid trachea. Lymph nodes causes mass
effect and narrowing of the right upper lobe bronchus bronchus
intermedius and both lower lobe bronchi. Representative measurement
at the level of the carina of 6.8 x 5.3 cm, series 1, image 23.
Subcarinal nodal conglomerate measures 3.4 x 5.2 cm, series 1, image
30. Prevascular nodal mass measuring 5.4 x 2.3 cm, series 1, image
25. Left supraclavicular adenopathy measuring 2 x 2 cm, series 1,
image 5. Additional adenopathy at multiple stations. Many of these
lymph nodes are partially calcified and low-density/necrotic. There
is no left hilar adenopathy. There is no axillary adenopathy. Slight
mass effect on the esophagus from nodal conglomerate, which is
difficult to delineate in the mid distal portion. No visualized
thyroid nodule.

Lungs/Pleura: There are multiple right pulmonary nodules. It is
difficult to delineate nodules from adenopathy in the central right
lung. Adenopathy is irregular borders. Multiple branching nodules in
the right upper lobe which may represent lymphangitic spread, for
example series 3, image 54. There is a 10 mm medial right lower lobe
pulmonary nodule, series 3, image 73. 9 mm right lower lobe nodule
series 3, image 87. There is pleural based nodularity in the
posterior right upper lobe that is contiguous with adenopathy.
Adenopathy causes mass effect and narrowing of the right upper lobe
bronchus, bronchus intermedius and right middle and lower lobe
bronchi. No significant pleural effusion.

No discrete left lung pulmonary nodules.  Mild emphysema.

Musculoskeletal: No blastic or destructive lytic lesions. Right
chest wall metastasis, series 1, image 35.

CT ABDOMEN PELVIS FINDINGS

Hepatobiliary: Large irregular hypodense liver lesion in the
inferior liver tip measures 6.1 x 4.3 cm. There is right greater
than left intrahepatic biliary ductal dilatation, likely due to mass
effect from periportal adenopathy on the common bile duct.
Gallbladder is not well visualized.

Pancreas: Heterogeneous mass in the pancreatic body measuring 4.3 x
2.5 cm. There is no distal atrophy. Mild proximal ductal dilatation
of 5 mm.

Spleen: Calcified granuloma.  Splenule inferiorly.

Adrenals/Urinary Tract: Nodular thickening of the left adrenal gland
up to 11 mm. Heterogeneous right adrenal mass measures 3.3 x 2.6 cm,
increased in size from prior exam. There is prominent right renal
parenchymal thinning with calyceal dilatation of the upper and lower
poles. Large intra caliceal stones in the lower right kidney. There
is no significant perinephric edema. Urinary bladder is distended
but unremarkable.

Stomach/Bowel: No obvious gastric mass. No small bowel obstruction
or inflammatory change. High-density material throughout the colon
may be contrast or bismuth containing products. Moderate stool
proximally. No obvious colonic mass. Appendix is normal.

Vascular/Lymphatic: Multifocal abdominal adenopathy. Periportal
adenopathy with 3 x 2.3 cm lymph node, series 1, image 66. There are
additional enlarged periportal nodes. Multiple enlarged upper
abdominal nodes in the retroperitoneum and peripancreatic space.
Representative right retroperitoneal node measures 3.0 x 1.8 cm,
series 1, image 65. There is an enlarged central mesenteric node at
9 mm, series 1, image 75. Right retroperitoneal adenopathy causes
mild mass effect on the IVC. There is a retrocrural node adjacent to
the distal aorta. No pelvic adenopathy. Aortic and branch
atherosclerosis. The portal vein is attenuated with mass effect from
adjacent adenopathy.

Reproductive: The uterus is not seen.  No obvious adnexal mass.

Other: No ascites or omental thickening.  No free air.

Musculoskeletal: Left paraspinal met measures 3.1 x 2.2 cm, series
168 at the level of L3-L4. No evidence of blastic or destructive
lytic lesion. Scoliotic curvature of the spine.
IMPRESSION: 1. Diffuse and multifocal malignancy throughout the chest and
abdomen. Site of primary malignancy is not definitively determined,
however but favored to represent right lung primary. There is also
pancreatic lesion which may be an alternative primary. Tissue
sampling is recommended.
2. Bulky mediastinal and right hilar adenopathy, nodal conglomerate
causes narrowing of the right upper lobe bronchus, right bronchus
intermedius, upper and lower lobe bronchi. Multiple right-sided
pulmonary nodules, including pleural based nodules posteriorly.
3. Large liver lesion suspicious for metastasis. There is biliary
dilatation which is likely secondary to mass effect on the common
bile duct from periportal adenopathy.
4. Multifocal abdominal adenopathy throughout the retroperitoneum,
periportal region, central mesentery.
5. Pancreatic mass measuring 4.3 cm which may be metastasis or
primary malignancy.
6. Right adrenal mass and left adrenal thickening.
7. Subcutaneous metastasis to the right chest wall. Left paraspinal
intramuscular metastasis at the level of L3.
8. Non oncologic findings of right renal atrophy with large
nonobstructing stones in the lower right kidney.

Aortic Atherosclerosis (WHQ53-GWL.L) and Emphysema (WHQ53-PAR.3).
